# Patient Record
Sex: Male | Born: 1950 | Race: Black or African American | Hispanic: No | Smoking: Former smoker
Health system: Southern US, Community
[De-identification: ages and names within clinical notes are randomized; demographics above are authoritative.]

## PROBLEM LIST (undated history)

## (undated) DIAGNOSIS — G40909 Epilepsy, unspecified, not intractable, without status epilepticus: Secondary | ICD-10-CM

## (undated) DIAGNOSIS — Z9981 Dependence on supplemental oxygen: Secondary | ICD-10-CM

## (undated) DIAGNOSIS — F329 Major depressive disorder, single episode, unspecified: Secondary | ICD-10-CM

## (undated) DIAGNOSIS — C3490 Malignant neoplasm of unspecified part of unspecified bronchus or lung: Secondary | ICD-10-CM

## (undated) DIAGNOSIS — F32A Depression, unspecified: Secondary | ICD-10-CM

## (undated) DIAGNOSIS — J189 Pneumonia, unspecified organism: Secondary | ICD-10-CM

## (undated) DIAGNOSIS — Z9289 Personal history of other medical treatment: Secondary | ICD-10-CM

## (undated) DIAGNOSIS — F419 Anxiety disorder, unspecified: Secondary | ICD-10-CM

## (undated) DIAGNOSIS — R0902 Hypoxemia: Secondary | ICD-10-CM

## (undated) DIAGNOSIS — A159 Respiratory tuberculosis unspecified: Secondary | ICD-10-CM

## (undated) DIAGNOSIS — I2510 Atherosclerotic heart disease of native coronary artery without angina pectoris: Secondary | ICD-10-CM

## (undated) DIAGNOSIS — I10 Essential (primary) hypertension: Secondary | ICD-10-CM

## (undated) DIAGNOSIS — F1910 Other psychoactive substance abuse, uncomplicated: Secondary | ICD-10-CM

## (undated) DIAGNOSIS — R7611 Nonspecific reaction to tuberculin skin test without active tuberculosis: Secondary | ICD-10-CM

## (undated) DIAGNOSIS — K922 Gastrointestinal hemorrhage, unspecified: Secondary | ICD-10-CM

## (undated) DIAGNOSIS — I639 Cerebral infarction, unspecified: Secondary | ICD-10-CM

## (undated) DIAGNOSIS — K769 Liver disease, unspecified: Secondary | ICD-10-CM

## (undated) HISTORY — PX: LUNG REMOVAL, PARTIAL: SHX233

---

## 2007-10-30 DIAGNOSIS — I639 Cerebral infarction, unspecified: Secondary | ICD-10-CM

## 2007-10-30 DIAGNOSIS — Z9289 Personal history of other medical treatment: Secondary | ICD-10-CM

## 2007-10-30 HISTORY — DX: Cerebral infarction, unspecified: I63.9

## 2007-10-30 HISTORY — DX: Personal history of other medical treatment: Z92.89

## 2012-02-27 HISTORY — PX: HIP ARTHROPLASTY: SHX981

## 2012-07-15 HISTORY — PX: LUNG BIOPSY: SHX232

## 2013-02-17 ENCOUNTER — Telehealth: Payer: Self-pay | Admitting: *Deleted

## 2013-02-17 ENCOUNTER — Encounter: Payer: Self-pay | Admitting: *Deleted

## 2013-02-17 NOTE — Progress Notes (Signed)
Called [facility] to obtain CD of scans.  Faxed requested and verified message received.  Called daughter with appt for mtoc 02/26/13 at 1:30 labs and 2:00 Dr. Arbutus Ped

## 2013-02-17 NOTE — Telephone Encounter (Signed)
Daughter called back to confirm appt

## 2013-02-24 ENCOUNTER — Telehealth: Payer: Self-pay | Admitting: Internal Medicine

## 2013-02-24 NOTE — Telephone Encounter (Signed)
C/D 02/24/13 for appt. 02/26/13

## 2013-02-26 ENCOUNTER — Emergency Department (HOSPITAL_COMMUNITY): Payer: Medicaid Other

## 2013-02-26 ENCOUNTER — Inpatient Hospital Stay (HOSPITAL_COMMUNITY)
Admission: EM | Admit: 2013-02-26 | Discharge: 2013-03-04 | DRG: 193 | Disposition: A | Discharge status: Skilled Nursing Facility | Payer: Medicaid Other | Attending: Internal Medicine | Admitting: Internal Medicine

## 2013-02-26 ENCOUNTER — Ambulatory Visit: Payer: Self-pay | Admitting: Internal Medicine

## 2013-02-26 ENCOUNTER — Encounter (HOSPITAL_COMMUNITY): Payer: Self-pay | Admitting: *Deleted

## 2013-02-26 DIAGNOSIS — Z7902 Long term (current) use of antithrombotics/antiplatelets: Secondary | ICD-10-CM

## 2013-02-26 DIAGNOSIS — J189 Pneumonia, unspecified organism: Principal | ICD-10-CM | POA: Diagnosis present

## 2013-02-26 DIAGNOSIS — I10 Essential (primary) hypertension: Secondary | ICD-10-CM | POA: Diagnosis present

## 2013-02-26 DIAGNOSIS — R7401 Elevation of levels of liver transaminase levels: Secondary | ICD-10-CM | POA: Diagnosis present

## 2013-02-26 DIAGNOSIS — K7689 Other specified diseases of liver: Secondary | ICD-10-CM | POA: Diagnosis present

## 2013-02-26 DIAGNOSIS — J9600 Acute respiratory failure, unspecified whether with hypoxia or hypercapnia: Secondary | ICD-10-CM

## 2013-02-26 DIAGNOSIS — J9601 Acute respiratory failure with hypoxia: Secondary | ICD-10-CM | POA: Diagnosis present

## 2013-02-26 DIAGNOSIS — Z79899 Other long term (current) drug therapy: Secondary | ICD-10-CM

## 2013-02-26 DIAGNOSIS — K8020 Calculus of gallbladder without cholecystitis without obstruction: Secondary | ICD-10-CM | POA: Diagnosis present

## 2013-02-26 DIAGNOSIS — R9431 Abnormal electrocardiogram [ECG] [EKG]: Secondary | ICD-10-CM | POA: Diagnosis present

## 2013-02-26 DIAGNOSIS — D72829 Elevated white blood cell count, unspecified: Secondary | ICD-10-CM | POA: Diagnosis present

## 2013-02-26 DIAGNOSIS — N179 Acute kidney failure, unspecified: Secondary | ICD-10-CM | POA: Diagnosis present

## 2013-02-26 DIAGNOSIS — R0902 Hypoxemia: Secondary | ICD-10-CM | POA: Diagnosis present

## 2013-02-26 DIAGNOSIS — R7402 Elevation of levels of lactic acid dehydrogenase (LDH): Secondary | ICD-10-CM | POA: Diagnosis present

## 2013-02-26 DIAGNOSIS — Z87891 Personal history of nicotine dependence: Secondary | ICD-10-CM

## 2013-02-26 DIAGNOSIS — D638 Anemia in other chronic diseases classified elsewhere: Secondary | ICD-10-CM | POA: Diagnosis present

## 2013-02-26 HISTORY — DX: Essential (primary) hypertension: I10

## 2013-02-26 LAB — CBC WITH DIFFERENTIAL/PLATELET
Basophils Absolute: 0 10*3/uL (ref 0.0–0.1)
Eosinophils Relative: 2 % (ref 0–5)
HCT: 32.2 % — ABNORMAL LOW (ref 39.0–52.0)
Lymphocytes Relative: 23 % (ref 12–46)
MCHC: 31.1 g/dL (ref 30.0–36.0)
MCV: 81.5 fL (ref 78.0–100.0)
Monocytes Absolute: 0.4 10*3/uL (ref 0.1–1.0)
RDW: 19.1 % — ABNORMAL HIGH (ref 11.5–15.5)
WBC: 10.8 10*3/uL — ABNORMAL HIGH (ref 4.0–10.5)

## 2013-02-26 LAB — COMPREHENSIVE METABOLIC PANEL
AST: 45 U/L — ABNORMAL HIGH (ref 0–37)
CO2: 26 mEq/L (ref 19–32)
Calcium: 9.1 mg/dL (ref 8.4–10.5)
Creatinine, Ser: 1.45 mg/dL — ABNORMAL HIGH (ref 0.50–1.35)
GFR calc Af Amer: 58 mL/min — ABNORMAL LOW (ref >90)
GFR calc non Af Amer: 50 mL/min — ABNORMAL LOW (ref >90)

## 2013-02-26 LAB — PROTIME-INR
INR: 1.05 (ref 0.00–1.49)
Prothrombin Time: 13.6 seconds (ref 11.6–15.2)

## 2013-02-26 MED ORDER — ONDANSETRON HCL 4 MG/2ML IJ SOLN
4.0000 mg | Freq: Once | INTRAMUSCULAR | Status: AC
  Administered 2013-02-26: 4 mg via INTRAVENOUS
  Filled 2013-02-26: qty 2

## 2013-02-26 MED ORDER — ONDANSETRON HCL 4 MG PO TABS: 4.0000 mg | ORAL_TABLET | Freq: Four times a day (QID) | ORAL | Status: DC | PRN

## 2013-02-26 MED ORDER — IOHEXOL 300 MG/ML  SOLN
50.0000 mL | Freq: Once | INTRAMUSCULAR | Status: AC | PRN
  Administered 2013-02-26: 50 mL via ORAL

## 2013-02-26 MED ORDER — VERAPAMIL HCL 40 MG PO TABS
40.0000 mg | ORAL_TABLET | Freq: Every day | ORAL | Status: DC
  Administered 2013-02-27 – 2013-03-04 (×6): 40 mg via ORAL
  Filled 2013-02-26 (×6): qty 1

## 2013-02-26 MED ORDER — HYDROCODONE-ACETAMINOPHEN 5-325 MG PO TABS
1.0000 | ORAL_TABLET | ORAL | Status: DC | PRN
  Administered 2013-02-27: 2 via ORAL
  Filled 2013-02-26: qty 2

## 2013-02-26 MED ORDER — METHADONE HCL 10 MG PO TABS
70.0000 mg | ORAL_TABLET | Freq: Every day | ORAL | Status: DC
  Administered 2013-02-26 – 2013-02-28 (×3): 70 mg via ORAL
  Filled 2013-02-26 (×3): qty 7

## 2013-02-26 MED ORDER — DOCUSATE SODIUM 100 MG PO CAPS
100.0000 mg | ORAL_CAPSULE | Freq: Two times a day (BID) | ORAL | Status: DC
  Administered 2013-02-26 – 2013-03-04 (×12): 100 mg via ORAL
  Filled 2013-02-26 (×13): qty 1

## 2013-02-26 MED ORDER — SODIUM CHLORIDE 0.9 % IV SOLN: INTRAVENOUS | Status: DC

## 2013-02-26 MED ORDER — ONDANSETRON HCL 4 MG/2ML IJ SOLN: 4.0000 mg | Freq: Four times a day (QID) | INTRAMUSCULAR | Status: DC | PRN

## 2013-02-26 MED ORDER — ACETAMINOPHEN 650 MG RE SUPP: 650.0000 mg | Freq: Four times a day (QID) | RECTAL | Status: DC | PRN

## 2013-02-26 MED ORDER — MORPHINE SULFATE 2 MG/ML IJ SOLN
1.0000 mg | INTRAMUSCULAR | Status: DC | PRN
  Administered 2013-03-01 – 2013-03-03 (×4): 1 mg via INTRAVENOUS
  Filled 2013-02-26 (×4): qty 1

## 2013-02-26 MED ORDER — CLOPIDOGREL BISULFATE 75 MG PO TABS
75.0000 mg | ORAL_TABLET | Freq: Every day | ORAL | Status: DC
  Administered 2013-02-27 – 2013-03-04 (×6): 75 mg via ORAL
  Filled 2013-02-26 (×6): qty 1

## 2013-02-26 MED ORDER — METOPROLOL SUCCINATE ER 100 MG PO TB24
200.0000 mg | ORAL_TABLET | Freq: Every day | ORAL | Status: DC
  Administered 2013-02-27 – 2013-03-04 (×6): 200 mg via ORAL
  Filled 2013-02-26 (×6): qty 2

## 2013-02-26 MED ORDER — DEXTROSE 5 % IV SOLN
500.0000 mg | INTRAVENOUS | Status: DC
  Administered 2013-02-27: 500 mg via INTRAVENOUS
  Filled 2013-02-26 (×2): qty 500

## 2013-02-26 MED ORDER — ADULT MULTIVITAMIN W/MINERALS CH
1.0000 | ORAL_TABLET | Freq: Every day | ORAL | Status: DC
  Administered 2013-02-27 – 2013-03-04 (×6): 1 via ORAL
  Filled 2013-02-26 (×6): qty 1

## 2013-02-26 MED ORDER — ACETAMINOPHEN 325 MG PO TABS: 650.0000 mg | ORAL_TABLET | Freq: Four times a day (QID) | ORAL | Status: DC | PRN

## 2013-02-26 MED ORDER — DEXTROSE 5 % IV SOLN
1.0000 g | INTRAVENOUS | Status: DC
  Administered 2013-02-27 – 2013-02-28 (×2): 1 g via INTRAVENOUS
  Filled 2013-02-26 (×2): qty 10

## 2013-02-26 MED ORDER — ALBUTEROL SULFATE (5 MG/ML) 0.5% IN NEBU
2.5000 mg | INHALATION_SOLUTION | RESPIRATORY_TRACT | Status: DC | PRN
  Administered 2013-03-01 – 2013-03-03 (×3): 2.5 mg via RESPIRATORY_TRACT
  Filled 2013-02-26 (×3): qty 0.5

## 2013-02-26 MED ORDER — ASPIRIN EC 81 MG PO TBEC
81.0000 mg | DELAYED_RELEASE_TABLET | Freq: Every day | ORAL | Status: DC
  Administered 2013-02-26 – 2013-03-04 (×7): 81 mg via ORAL
  Filled 2013-02-26 (×7): qty 1

## 2013-02-26 MED ORDER — IOHEXOL 300 MG/ML  SOLN
100.0000 mL | Freq: Once | INTRAMUSCULAR | Status: AC | PRN
  Administered 2013-02-26: 100 mL via INTRAVENOUS

## 2013-02-26 MED ORDER — AZITHROMYCIN 250 MG PO TABS
500.0000 mg | ORAL_TABLET | Freq: Once | ORAL | Status: AC
  Administered 2013-02-26: 500 mg via ORAL
  Filled 2013-02-26: qty 2

## 2013-02-26 MED ORDER — DEXTROSE 5 % IV SOLN
1.0000 g | Freq: Once | INTRAVENOUS | Status: AC
  Administered 2013-02-26: 1 g via INTRAVENOUS
  Filled 2013-02-26: qty 10
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
## 2013-02-26 NOTE — ED Notes (Signed)
Pt placed on 2lpm O2 per RN

## 2013-02-26 NOTE — H&P (Signed)
[facility] History and Physical  Mark [name] ZOX:[id number] DOB: November 03, 1950 DOA: 02/26/2013  Referring physician: ER physician PCP: No primary provider on file.   Chief Complaint: Shortness of breath  HPI:  62 year old male with past medical history significant for lung cancer status post partial lobectomy on the left side without chemotherapy or radiation therapy, hypertension who presented [facility] ED with worsening shortness of breath for past few days prior to this admission. Patient reported chronic intermittent dry cough. No chest pain. No palpitations. No complaints of abdominal pain, nausea or vomiting. No diarrhea or constipation. No fever or chills. No lightheadedness or loss of consciousness. No weakness. In ED, evaluation included chest x-ray which was significant for left lower lobe consolidation. CT chest was significant for bilateral pneumonia with consolidation air bronchograms on the right. bilateral partially loculated pleural effusions but no clear evidence of metastatic disease. CT abdomen was significant for left hepatic lobe lesion which per radiology may be evaluated with dedicated MRI.  Assessment and plan:  Principal Problem:   Acute respiratory failure with hypoxia - Likely related to community-acquired pneumonia - Respiratory status stable, patient saturating 100% on 2 L nasal cannula - Pneumonia order set in place. Followup blood cultures, respiratory cultures, Legionella and strep pneumonia results - Continue azithromycin and ceftriaxone IV - Nebulizer treatments as needed  Active Problems:   CAP (community acquired pneumonia) - Management as above with azithromycin and ceftriaxone   Essential hypertension, benign - Continue home medication, metoprolol and verapamil   Anemia of chronic disease - Likely related to history of prior lung cancer - Hemoglobin 10 on this admission   Acute kidney failure - Perhaps prerenal etiology, dehydration - Continue IV  fluids and follow up renal function the morning   Leukocytosis, unspecified - Likely secondary to community-acquired pneumonia  Code Status: Full Family Communication: Pt at bedside Disposition Plan: Admit for further evaluation  Manson Passey, MD  [facility] Pager [phone]  If 7PM-7AM, please contact night-coverage [email or web address] Password [email or web address] 02/26/2013, 4:58 PM   Review of Systems:  Constitutional: Negative for fever, chills and malaise/fatigue. Negative for diaphoresis.  HENT: Negative for hearing loss, ear pain, nosebleeds, congestion, sore throat, neck pain, tinnitus and ear discharge.   Eyes: Negative for blurred vision, double vision, photophobia, pain, discharge and redness.  Respiratory: Per history of present illness.   Cardiovascular: Negative for chest pain, palpitations, orthopnea, claudication and leg swelling.  Gastrointestinal: Negative for nausea, vomiting and abdominal pain. Negative for heartburn, constipation, blood in stool and melena.  Genitourinary: Negative for dysuria, urgency, frequency, hematuria and flank pain.  Musculoskeletal: Negative for myalgias, back pain, joint pain and falls.  Skin: Negative for itching and rash.  Neurological: Negative for dizziness and weakness. Negative for tingling, tremors, sensory change, speech change, focal weakness, loss of consciousness and headaches.  Endo/Heme/Allergies: Negative for environmental allergies and polydipsia. Does not bruise/bleed easily.  Psychiatric/Behavioral: Negative for suicidal ideas. The patient is not nervous/anxious.      Past Medical History  Diagnosis Date  . Cancer     lung  . Essential hypertension, benign 02/26/2013   Past Surgical History  Procedure Laterality Date  . Left hip surgery    . Lung surgery-lobectomy     Social History:  reports that he quit smoking about a year ago. His smoking use included Cigarettes. He smoked 0.00 packs per day. He has never used smokeless tobacco. He reports  that he does not drink alcohol or use illicit drugs.  No Known Allergies  Family History: Family medical history significant for HTN, HLD   Prior to Admission medications   Medication Sig Start Date End Date Taking? Authorizing Provider  clopidogrel (PLAVIX) 75 MG tablet Take 75 mg by mouth daily.   Yes Historical Provider, MD  methadone (DOLOPHINE) 10 MG tablet Take 70 mg by mouth daily.   Yes Historical Provider, MD  metoprolol succinate (TOPROL-XL) 100 MG 24 hr tablet Take 200 mg by mouth daily with breakfast. Take with or immediately following a meal.   Yes Historical Provider, MD  Multiple Vitamin (MULTIVITAMIN WITH MINERALS) TABS Take 1 tablet by mouth daily.   Yes Historical Provider, MD  verapamil (CALAN) 80 MG tablet Take 40 mg by mouth daily.   Yes Historical Provider, MD   Physical Exam: Filed Vitals:   02/26/13 1430 02/26/13 1433 02/26/13 1528 02/26/13 1555  BP:  162/94 166/94 162/90  Pulse: 62   69  Temp:   97.7 F (36.5 C) 97.8 F (36.6 C)  TempSrc:   Oral Oral  Resp: 8 13 12 16   Height:    5' 11.5" (1.816 m)  SpO2: 100% 100% 100% 100%    Physical Exam  Constitutional: Appears well-developed and well-nourished. No distress.  HENT: Normocephalic. External right and left ear normal. Oropharynx is clear and moist.  Eyes: Conjunctivae and EOM are normal. PERRLA, no scleral icterus.  Neck: Normal ROM. Neck supple. No JVD. No tracheal deviation. No thyromegaly.  CVS: RRR, S1/S2 +, no murmurs, no gallops, no carotid bruit.  Pulmonary: Effort and breath sounds normal, no stridor, rhonchi, wheezes, rales.  Abdominal: Soft. BS +,  no distension, tenderness, rebound or guarding.  Musculoskeletal: Normal range of motion. No edema and no tenderness.  Lymphadenopathy: No lymphadenopathy noted, cervical, inguinal. Neuro: Alert. Normal reflexes, muscle tone coordination. No cranial nerve deficit. Skin: Skin is warm and dry. No rash noted. Not diaphoretic. No erythema. No pallor.   Psychiatric: Normal mood and affect. Behavior, judgment, thought content normal.   Labs on Admission:  Basic Metabolic Panel:  Recent Labs Lab 02/26/13 0950  NA 139  K 4.0  CL 102  CO2 26  GLUCOSE 178*  BUN 31*  CREATININE 1.45*  CALCIUM 9.1   Liver Function Tests:  Recent Labs Lab 02/26/13 0950  AST 45*  ALT 15  ALKPHOS 96  BILITOT 0.4  PROT 8.5*  ALBUMIN 3.0*   No results found for this basename: LIPASE, AMYLASE,  in the last 168 hours No results found for this basename: AMMONIA,  in the last 168 hours CBC:  Recent Labs Lab 02/26/13 0950  WBC 10.8*  NEUTROABS 7.7  HGB 10.0*  HCT 32.2*  MCV 81.5  PLT 400   Cardiac Enzymes: No results found for this basename: CKTOTAL, CKMB, CKMBINDEX, TROPONINI,  in the last 168 hours BNP: No components found with this basename: POCBNP,  CBG:  Recent Labs Lab 02/26/13 0926  GLUCAP 154*    Radiological Exams on Admission: Dg Chest 2 View 02/26/2013  *  IMPRESSION: Airspace disease bilaterally with consolidation in the left lower lobe, particularly involving the superior segment.  An underlying mass cannot be excluded. Given the clinical history and absence of prior studies to compare, correlation with chest CT, ideally with intravenous contrast, at this time may well be advisable to further assess.   Ct Chest W Contrast 02/26/2013  *  IMPRESSION:  1.  Bilateral pneumonia with consolidation air bronchograms on the right. 2.  Bilateral partially loculated pleural effusions. 3.  No  clear evidence of metastatic disease.  CT ABDOMEN AND PELVIS  Findings:  Low density lesion in the anterior aspect the right hepatic lobe measuring 27 x 11 mm (image 62).  No biliary duct dilatation.  A small gallstone in the gallbladder.  The pancreas, spleen, adrenal glands, and right adrenal gland are normal.  There is small nodule within the left adrenal gland measuring 7 mm. Kidneys are normal.  The stomach, small bowel, appendix, cecum are  normal.  The colon and rectosigmoid colon are normal.  Abdominal aorta normal caliber.  No retroperitoneal or periportal lymphadenopathy.  Review of  bone windows demonstrates no aggressive osseous lesions.  IMPRESSION:  1. Indeterminate lesion in the anterior left hepatic lobe. Recommend MRI without and with contrast preferably in the outpatient setting when the patient can hold his breath.  2.  Contrast within the esophagus suggests gastroesophageal reflux disease.  3.  Small nodule within the left adrenal gland.  Consider evaluation with recommend MRI as above.   Original Report Authenticated By: Genevive Bi, M.D.    Ct Abdomen Pelvis W Contrast 02/26/2013  * IMPRESSION:  1. Indeterminate lesion in the anterior left hepatic lobe. Recommend MRI without and with contrast preferably in the outpatient setting when the patient can hold his breath.  2.  Contrast within the esophagus suggests gastroesophageal reflux disease.  3.  Small nodule within the left adrenal gland.  Consider evaluation with recommend MRI as above.   Original Report Authenticated By: Genevive Bi, M.D.     EKG: Normal sinus rhythm, no ST/T wave changes

## 2013-02-26 NOTE — ED Notes (Signed)
Report called to [facility]

## 2013-02-26 NOTE — ED Notes (Signed)
Pt and family reports pt has hx of lung cancer. Since feb wearing oxygen at night. 2 days ago started having SOB and cough. 83% on room air. Placed on 2 L [place].

## 2013-02-26 NOTE — ED Provider Notes (Signed)
History     CSN: [id number]  Arrival date & time 02/26/13  0919   First MD Initiated Contact with Patient 02/26/13 [phone]      Chief Complaint  Patient presents with  . lung cancer pt, shob      HPI Patient reports worsening shortness of breath over the past 2 days.  He has a history of lung cancer.  He was treated in [place] in 2013 with a partial lobectomy on his left side.  No chemotherapy or radiation.  His breathing worsened over the past several days.  He denies orthopnea.  He has dyspnea with exertion.  Since February start wearing oxygen at night and was found to have oxygen saturations of 83% on room air today.  The patient does not have a primary care physician in the area.  His symptoms are mild to moderate in severity.  His symptoms seem to be worsening.  Chills without documented fever.  Nothing improves his symptoms   Past Medical History  Diagnosis Date  . Cancer     lung    History reviewed. No pertinent past surgical history.  History reviewed. No pertinent family history.  History  Substance Use Topics  . Smoking status: Not on file  . Smokeless tobacco: Not on file  . Alcohol Use: Not on file      Review of Systems  All other systems reviewed and are negative.    Allergies  Review of patient's allergies indicates no known allergies.  Home Medications  No current outpatient prescriptions on file.  BP 177/88  Temp(Src) 99.3 F (37.4 C) (Rectal)  Resp 15  SpO2 100%  Physical Exam  Nursing note and vitals reviewed. Constitutional: He is oriented to person, place, and time. He appears well-developed and well-nourished.  HENT:  Head: Normocephalic and atraumatic.  Eyes: EOM are normal.  Neck: Normal range of motion.  Cardiovascular: Normal rate, regular rhythm, normal heart sounds and intact distal pulses.   Pulmonary/Chest: Effort normal and breath sounds normal. No respiratory distress.  Rhonchi bilaterally  Abdominal: Soft. He exhibits no  distension. There is no tenderness.  Genitourinary: Rectum normal.  Musculoskeletal: Normal range of motion.  Neurological: He is alert and oriented to person, place, and time.  Skin: Skin is warm and dry.  Psychiatric: He has a normal mood and affect. Judgment normal.    ED Course  Procedures (including critical care time)   Date: 02/26/2013  Rate: 68  Rhythm: normal sinus rhythm  QRS Axis: normal  Intervals: normal  ST/T Wave abnormalities: Nonspecific ST and T wave changes  Conduction Disutrbances: none  Narrative Interpretation:   Old EKG Reviewed: No prior EKG available    Labs Reviewed  GLUCOSE, CAPILLARY - Abnormal; Notable for the following:    Glucose-Capillary 154 (*)    All other components within normal limits  CBC WITH DIFFERENTIAL - Abnormal; Notable for the following:    WBC 10.8 (*)    RBC 3.95 (*)    Hemoglobin 10.0 (*)    HCT 32.2 (*)    MCH 25.3 (*)    RDW 19.1 (*)    All other components within normal limits  COMPREHENSIVE METABOLIC PANEL - Abnormal; Notable for the following:    Glucose, Bld 178 (*)    BUN 31 (*)    Creatinine, Ser 1.45 (*)    Total Protein 8.5 (*)    Albumin 3.0 (*)    AST 45 (*)    GFR calc non Af  Amer 50 (*)    GFR calc Af Amer 58 (*)    All other components within normal limits  PROTIME-INR   Dg Chest 2 View  02/26/2013  *RADIOLOGY REPORT*  Clinical Data: History of lung carcinoma with chest pain and shortness of breath  CHEST - 2 VIEW  Comparison: None.  Findings:  There is airspace consolidation throughout much of the left lower lobe as well as more patchy infiltrate throughout the right mid and lower lung zones.  There is loculated left effusion.  Heart is mildly enlarged with normal pulmonary vascularity.  There is no convincing adenopathy.  No bone lesions are appreciable.  IMPRESSION: Airspace disease bilaterally with consolidation in the left lower lobe, particularly involving the superior segment.  An underlying mass  cannot be excluded. Given the clinical history and absence of prior studies to compare, correlation with chest CT, ideally with intravenous contrast, at this time may well be advisable to further assess.   Original Report Authenticated By: Bretta Bang, M.D.    Ct Chest W Contrast  02/26/2013  *RADIOLOGY REPORT*  Clinical Data:  Lung cancer, short of breath, chest pain  CT CHEST, ABDOMEN AND PELVIS WITH CONTRAST  Technique:  Multidetector CT imaging of the chest, abdomen and pelvis was performed following the standard protocol during bolus administration of intravenous contrast.  Contrast: 50mL OMNIPAQUE IOHEXOL 300 MG/ML  SOLN, OMNIPAQUE IOHEXOL 300 MG/ML  SOLN  Comparison:  Chest radiograph 02/26/2013  CT CHEST  Findings:  No axillary or supraclavicular lymphadenopathy.  No mediastinal or hilar lymphadenopathy  8 and 9 mm right lower paratracheal lymph nodes are noted.  There is oral contrast within the esophagus.  Review of the lung parenchyma demonstrates bilateral pleural effusions which are partially loculated on the left.  There is air space consolidation with air bronchograms in the right lower lobe. More diffuse air space disease within the left lower lobe and lingula.  No focal mass lesion identified.  No central obstructing lesion evident within the left or right bronchial tree.  IMPRESSION:  1.  Bilateral pneumonia with consolidation air bronchograms on the right. 2.  Bilateral partially loculated pleural effusions. 3.  No clear evidence of metastatic disease.  CT ABDOMEN AND PELVIS  Findings:  Low density lesion in the anterior aspect the right hepatic lobe measuring 27 x 11 mm (image 62).  No biliary duct dilatation.  A small gallstone in the gallbladder.  The pancreas, spleen, adrenal glands, and right adrenal gland are normal.  There is small nodule within the left adrenal gland measuring 7 mm. Kidneys are normal.  The stomach, small bowel, appendix, cecum are normal.  The colon and  rectosigmoid colon are normal.  Abdominal aorta normal caliber.  No retroperitoneal or periportal lymphadenopathy.  Review of  bone windows demonstrates no aggressive osseous lesions.  IMPRESSION:  1. Indeterminate lesion in the anterior left hepatic lobe. Recommend MRI without and with contrast preferably in the outpatient setting when the patient can hold his breath.  2.  Contrast within the esophagus suggests gastroesophageal reflux disease.  3.  Small nodule within the left adrenal gland.  Consider evaluation with recommend MRI as above.   Original Report Authenticated By: Genevive Bi, M.D.    Ct Abdomen Pelvis W Contrast  02/26/2013  *RADIOLOGY REPORT*  Clinical Data:  Lung cancer, short of breath, chest pain  CT CHEST, ABDOMEN AND PELVIS WITH CONTRAST  Technique:  Multidetector CT imaging of the chest, abdomen and pelvis was performed following  the standard protocol during bolus administration of intravenous contrast.  Contrast: 50mL OMNIPAQUE IOHEXOL 300 MG/ML  SOLN, OMNIPAQUE IOHEXOL 300 MG/ML  SOLN  Comparison:  Chest radiograph 02/26/2013  CT CHEST  Findings:  No axillary or supraclavicular lymphadenopathy.  No mediastinal or hilar lymphadenopathy  8 and 9 mm right lower paratracheal lymph nodes are noted.  There is oral contrast within the esophagus.  Review of the lung parenchyma demonstrates bilateral pleural effusions which are partially loculated on the left.  There is air space consolidation with air bronchograms in the right lower lobe. More diffuse air space disease within the left lower lobe and lingula.  No focal mass lesion identified.  No central obstructing lesion evident within the left or right bronchial tree.  IMPRESSION:  1.  Bilateral pneumonia with consolidation air bronchograms on the right. 2.  Bilateral partially loculated pleural effusions. 3.  No clear evidence of metastatic disease.  CT ABDOMEN AND PELVIS  Findings:  Low density lesion in the anterior aspect the right  hepatic lobe measuring 27 x 11 mm (image 62).  No biliary duct dilatation.  A small gallstone in the gallbladder.  The pancreas, spleen, adrenal glands, and right adrenal gland are normal.  There is small nodule within the left adrenal gland measuring 7 mm. Kidneys are normal.  The stomach, small bowel, appendix, cecum are normal.  The colon and rectosigmoid colon are normal.  Abdominal aorta normal caliber.  No retroperitoneal or periportal lymphadenopathy.  Review of  bone windows demonstrates no aggressive osseous lesions.  IMPRESSION:  1. Indeterminate lesion in the anterior left hepatic lobe. Recommend MRI without and with contrast preferably in the outpatient setting when the patient can hold his breath.  2.  Contrast within the esophagus suggests gastroesophageal reflux disease.  3.  Small nodule within the left adrenal gland.  Consider evaluation with recommend MRI as above.   Original Report Authenticated By: Genevive Bi, M.D.    I personally reviewed the imaging tests through PACS system I reviewed available ER/hospitalization records through the EMR   1. CAP (community acquired pneumonia)       MDM  Patient with evidence of community acquired pneumonia.  CT chest abdomen and pelvis completed given history of cancer  2:07 PM CT chest abdomen and pelvis with evidence of bilateral pneumonia.  No obvious masses noted.  Admit to hospital.        Lyanne Co, MD 02/26/13 [phone]

## 2013-02-26 NOTE — Progress Notes (Addendum)
[facility] ED CM Spoke with daughter after obtaining permission from patient in regards to obtaining  PCP. Daughter states, she was in the process of finding a PCP but he got sick, he was also scheduled for an Oncology appt today that he was not able to go to due to this ED visit. She was provided with a list of self pay PCP in [facility] She confirmed he has applied for Medicaid, and results are pending. She also confirmed that he has a walker, and was only using oxygen prn at night. Pt's daughter states, she is in the process of getting him DME services with [employer] or [employer]) and is waiting to hear back from them.

## 2013-02-26 NOTE — ED Notes (Signed)
Report to [employer]

## 2013-02-27 DIAGNOSIS — I10 Essential (primary) hypertension: Secondary | ICD-10-CM

## 2013-02-27 LAB — LEGIONELLA ANTIGEN, URINE: Legionella Antigen, Urine: NEGATIVE

## 2013-02-27 LAB — CBC
MCH: 24.3 pg — ABNORMAL LOW (ref 26.0–34.0)
MCHC: 29.5 g/dL — ABNORMAL LOW (ref 30.0–36.0)
Platelets: 348 10*3/uL (ref 150–400)
RBC: 3.75 MIL/uL — ABNORMAL LOW (ref 4.22–5.81)

## 2013-02-27 LAB — COMPREHENSIVE METABOLIC PANEL
AST: 65 U/L — ABNORMAL HIGH (ref 0–37)
CO2: 27 mEq/L (ref 19–32)
Calcium: 8.5 mg/dL (ref 8.4–10.5)
Chloride: 105 mEq/L (ref 96–112)
Creatinine, Ser: 1.34 mg/dL (ref 0.50–1.35)
GFR calc Af Amer: 64 mL/min — ABNORMAL LOW (ref >90)
GFR calc non Af Amer: 55 mL/min — ABNORMAL LOW (ref >90)
Glucose, Bld: 92 mg/dL (ref 70–99)
Total Bilirubin: 0.3 mg/dL (ref 0.3–1.2)

## 2013-02-27 LAB — APTT: aPTT: 33 seconds (ref 24–37)

## 2013-02-27 LAB — PROTIME-INR: Prothrombin Time: 14 seconds (ref 11.6–15.2)

## 2013-02-27 LAB — GLUCOSE, CAPILLARY: Glucose-Capillary: 117 mg/dL — ABNORMAL HIGH (ref 70–99)

## 2013-02-27 MED ORDER — POLYETHYLENE GLYCOL 3350 17 G PO PACK
17.0000 g | PACK | Freq: Every day | ORAL | Status: DC | PRN
  Filled 2013-02-27: qty 1

## 2013-02-27 MED ORDER — FLEET ENEMA 7-19 GM/118ML RE ENEM: 1.0000 | ENEMA | Freq: Once | RECTAL | Status: AC | PRN

## 2013-02-27 MED ORDER — SODIUM CHLORIDE 0.9 % IV SOLN
INTRAVENOUS | Status: AC
  Administered 2013-02-27: 15:00:00 via INTRAVENOUS

## 2013-02-27 NOTE — Progress Notes (Signed)
[facility] PROGRESS NOTE  Priscilla Finklea AVW:[id number] DOB: 09-09-1951 DOA: 02/26/2013 PCP: No primary provider on file.  Assessment/Plan: Principal Problem:  Acute respiratory failure with hypoxia  - Likely related to community-acquired pneumonia  - Respiratory status stable, patient saturating 100% on 2 L nasal cannula  - Pneumonia order set in place. Followup blood cultures, respiratory cultures, Legionella and strep pneumonia results  - Continue azithromycin and ceftriaxone IV  - Nebulizer treatments as needed  Active Problems:  CAP (community acquired pneumonia)  - Management as above with azithromycin and ceftriaxone  Essential hypertension, benign  - Continue home medication, metoprolol and verapamil  Anemia of chronic disease  - Likely related to history of prior lung cancer  - Hemoglobin 10 on this admission  Acute kidney failure  - Perhaps prerenal etiology, dehydration  - Continue IV fluids and follow up renal function the morning  Leukocytosis, unspecified  - Likely secondary to community-acquired pneumonia  - IMPROVING.    HPI/Subjective:  comfortable  Objective: Filed Vitals:   02/26/13 1528 02/26/13 1555 02/26/13 2144 02/27/13 0605  BP: 166/94 162/90 153/88 159/93  Pulse:  69 62 69  Temp: 97.7 F (36.5 C) 97.8 F (36.6 C) 98.2 F (36.8 C) 98.4 F (36.9 C)  TempSrc: Oral Oral Oral Oral  Resp: 12 16 16 16   Height:  5' 11.5" (1.816 m)    SpO2: 100% 100% 97% 99%    Intake/Output Summary (Last 24 hours) at 02/27/13 0820 Last data filed at 02/27/13 0713  Gross per 24 hour  Intake 1467.5 ml  Output    150 ml  Net 1317.5 ml   There were no vitals filed for this visit.  Exam: Alert afebrile comfortable.  CVS: RRR, S1/S2 +, no murmurs, no gallops, no carotid bruit.  Pulmonary: Effort and breath sounds normal, no stridor, rhonchi, wheezes, rales.  Abdominal: Soft. BS +, no distension, tenderness, rebound or guarding.  Musculoskeletal: Normal range  of motion. No edema and no tenderness.  Lymphadenopathy: No lymphadenopathy noted, cervical, inguinal. Neuro: Alert. Normal reflexes, muscle tone coordination. No cranial nerve deficit.   Data Reviewed: Basic Metabolic Panel:  Recent Labs Lab 02/26/13 0950 02/27/13 0500  NA 139 141  K 4.0 3.9  CL 102 105  CO2 26 27  GLUCOSE 178* 92  BUN 31* 31*  CREATININE 1.45* 1.34  CALCIUM 9.1 8.5   Liver Function Tests:  Recent Labs Lab 02/26/13 0950 02/27/13 0500  AST 45* 65*  ALT 15 24  ALKPHOS 96 117  BILITOT 0.4 0.3  PROT 8.5* 7.2  ALBUMIN 3.0* 2.5*   No results found for this basename: LIPASE, AMYLASE,  in the last 168 hours No results found for this basename: AMMONIA,  in the last 168 hours CBC:  Recent Labs Lab 02/26/13 0950 02/27/13 0500  WBC 10.8* 10.0  NEUTROABS 7.7  --   HGB 10.0* 9.1*  HCT 32.2* 30.8*  MCV 81.5 82.1  PLT 400 348   Cardiac Enzymes: No results found for this basename: CKTOTAL, CKMB, CKMBINDEX, TROPONINI,  in the last 168 hours BNP (last 3 results) No results found for this basename: PROBNP,  in the last 8760 hours CBG:  Recent Labs Lab 02/26/13 0926  GLUCAP 154*    No results found for this or any previous visit (from the past 240 hour(s)).   Studies: Dg Chest 2 View  02/26/2013  *RADIOLOGY REPORT*  Clinical Data: History of lung carcinoma with chest pain and shortness of breath  CHEST - 2  VIEW  Comparison: None.  Findings:  There is airspace consolidation throughout much of the left lower lobe as well as more patchy infiltrate throughout the right mid and lower lung zones.  There is loculated left effusion.  Heart is mildly enlarged with normal pulmonary vascularity.  There is no convincing adenopathy.  No bone lesions are appreciable.  IMPRESSION: Airspace disease bilaterally with consolidation in the left lower lobe, particularly involving the superior segment.  An underlying mass cannot be excluded. Given the clinical history and  absence of prior studies to compare, correlation with chest CT, ideally with intravenous contrast, at this time may well be advisable to further assess.   Original Report Authenticated By: Bretta Bang, M.D.    Ct Chest W Contrast  02/26/2013  *RADIOLOGY REPORT*  Clinical Data:  Lung cancer, short of breath, chest pain  CT CHEST, ABDOMEN AND PELVIS WITH CONTRAST  Technique:  Multidetector CT imaging of the chest, abdomen and pelvis was performed following the standard protocol during bolus administration of intravenous contrast.  Contrast: 50mL OMNIPAQUE IOHEXOL 300 MG/ML  SOLN, OMNIPAQUE IOHEXOL 300 MG/ML  SOLN  Comparison:  Chest radiograph 02/26/2013  CT CHEST  Findings:  No axillary or supraclavicular lymphadenopathy.  No mediastinal or hilar lymphadenopathy  8 and 9 mm right lower paratracheal lymph nodes are noted.  There is oral contrast within the esophagus.  Review of the lung parenchyma demonstrates bilateral pleural effusions which are partially loculated on the left.  There is air space consolidation with air bronchograms in the right lower lobe. More diffuse air space disease within the left lower lobe and lingula.  No focal mass lesion identified.  No central obstructing lesion evident within the left or right bronchial tree.  IMPRESSION:  1.  Bilateral pneumonia with consolidation air bronchograms on the right. 2.  Bilateral partially loculated pleural effusions. 3.  No clear evidence of metastatic disease.  CT ABDOMEN AND PELVIS  Findings:  Low density lesion in the anterior aspect the right hepatic lobe measuring 27 x 11 mm (image 62).  No biliary duct dilatation.  A small gallstone in the gallbladder.  The pancreas, spleen, adrenal glands, and right adrenal gland are normal.  There is small nodule within the left adrenal gland measuring 7 mm. Kidneys are normal.  The stomach, small bowel, appendix, cecum are normal.  The colon and rectosigmoid colon are normal.  Abdominal aorta normal  caliber.  No retroperitoneal or periportal lymphadenopathy.  Review of  bone windows demonstrates no aggressive osseous lesions.  IMPRESSION:  1. Indeterminate lesion in the anterior left hepatic lobe. Recommend MRI without and with contrast preferably in the outpatient setting when the patient can hold his breath.  2.  Contrast within the esophagus suggests gastroesophageal reflux disease.  3.  Small nodule within the left adrenal gland.  Consider evaluation with recommend MRI as above.   Original Report Authenticated By: Genevive Bi, M.D.    Ct Abdomen Pelvis W Contrast  02/26/2013  *RADIOLOGY REPORT*  Clinical Data:  Lung cancer, short of breath, chest pain  CT CHEST, ABDOMEN AND PELVIS WITH CONTRAST  Technique:  Multidetector CT imaging of the chest, abdomen and pelvis was performed following the standard protocol during bolus administration of intravenous contrast.  Contrast: 50mL OMNIPAQUE IOHEXOL 300 MG/ML  SOLN, OMNIPAQUE IOHEXOL 300 MG/ML  SOLN  Comparison:  Chest radiograph 02/26/2013  CT CHEST  Findings:  No axillary or supraclavicular lymphadenopathy.  No mediastinal or hilar lymphadenopathy  8 and 9  mm right lower paratracheal lymph nodes are noted.  There is oral contrast within the esophagus.  Review of the lung parenchyma demonstrates bilateral pleural effusions which are partially loculated on the left.  There is air space consolidation with air bronchograms in the right lower lobe. More diffuse air space disease within the left lower lobe and lingula.  No focal mass lesion identified.  No central obstructing lesion evident within the left or right bronchial tree.  IMPRESSION:  1.  Bilateral pneumonia with consolidation air bronchograms on the right. 2.  Bilateral partially loculated pleural effusions. 3.  No clear evidence of metastatic disease.  CT ABDOMEN AND PELVIS  Findings:  Low density lesion in the anterior aspect the right hepatic lobe measuring 27 x 11 mm (image 62).  No  biliary duct dilatation.  A small gallstone in the gallbladder.  The pancreas, spleen, adrenal glands, and right adrenal gland are normal.  There is small nodule within the left adrenal gland measuring 7 mm. Kidneys are normal.  The stomach, small bowel, appendix, cecum are normal.  The colon and rectosigmoid colon are normal.  Abdominal aorta normal caliber.  No retroperitoneal or periportal lymphadenopathy.  Review of  bone windows demonstrates no aggressive osseous lesions.  IMPRESSION:  1. Indeterminate lesion in the anterior left hepatic lobe. Recommend MRI without and with contrast preferably in the outpatient setting when the patient can hold his breath.  2.  Contrast within the esophagus suggests gastroesophageal reflux disease.  3.  Small nodule within the left adrenal gland.  Consider evaluation with recommend MRI as above.   Original Report Authenticated By: Genevive Bi, M.D.     Scheduled Meds: . aspirin EC  81 mg Oral Daily  . azithromycin  500 mg Intravenous Q24H  . cefTRIAXone (ROCEPHIN)  IV  1 g Intravenous Q24H  . clopidogrel  75 mg Oral Daily  . docusate sodium  100 mg Oral BID  . methadone  70 mg Oral Daily  . metoprolol succinate  200 mg Oral Daily  . multivitamin with minerals  1 tablet Oral Daily  . verapamil  40 mg Oral Daily   Continuous Infusions:   Principal Problem:   Acute respiratory failure with hypoxia Active Problems:   CAP (community acquired pneumonia)   Essential hypertension, benign   Anemia of chronic disease   Acute kidney failure   Leukocytosis, unspecified        Fayola Meckes  [facility] Pager [phone]. If 7PM-7AM, please contact night-coverage at [email or web address], password [facility] 02/27/2013, 8:20 AM  LOS: 1 day

## 2013-02-27 NOTE — Progress Notes (Signed)
Nutrition Brief Note  Patient identified on the Malnutrition Screening Tool (MST) Report  Height: 5' 11.5'' Weight: None yet recorded   Current diet order is heart healthy, patient is consuming approximately 100% of meals at this time. Labs and medications reviewed. Pt with lung CA s/p partial lobectomy. Met with pt who reports eating well PTA, 4-5 meals/day and stable weight.   No nutrition interventions warranted at this time. If nutrition issues arise, please consult RD.   Levon Hedger MS, RD, LDN [phone] Pager [phone] After Hours Pager

## 2013-02-27 NOTE — Evaluation (Signed)
Physical Therapy Evaluation Patient Details Name: Mark [name] MRN: [id number] DOB: 1951-01-05 Today's Date: 02/27/2013 Time: 9147-8295 PT Time Calculation (min): 20 min  PT Assessment / Plan / Recommendation Clinical Impression  Pt presents with acute respiratory failure w/ hypoxia in the setting of CAP.  Note history of lung cancer with partial lobectomy with intermittent O2 use at home.  Tolerated some ambulation in hallway, however was painful in abdomen and became nauseated upon returning to room.  Also required +2 assist for safety at time of eval.  Pt will benefit from skilled PT in acute venue to address deficits.  PT recommends SNF vs HHPT pending progress in hospital.      PT Assessment  Patient needs continued PT services    Follow Up Recommendations  Home health PT;SNF;Supervision/Assistance - 24 hour    Does the patient have the potential to tolerate intense rehabilitation      Barriers to Discharge Decreased caregiver support      Equipment Recommendations  None recommended by PT    Recommendations for Other Services OT consult   Frequency Min 3X/week    Precautions / Restrictions Precautions Precautions: Fall Precaution Comments: monitor O2 Restrictions Weight Bearing Restrictions: No   Pertinent Vitals/Pain Pt with 7/10 pain in abdomen and nauseated at end of session.  RN made aware.       Mobility  Bed Mobility Bed Mobility: Not assessed Details for Bed Mobility Assistance: Pt on 3in1 when PT arrived.  Transfers Transfers: Sit to Stand;Stand to Sit Sit to Stand: 4: Min assist;With upper extremity assist;With armrests;From chair/3-in-1 Stand to Sit: 4: Min assist;With upper extremity assist;With armrests;To chair/3-in-1 Details for Transfer Assistance: Assist to rise, steady and ensure controlled descent with cues for hand placement and safety.   Ambulation/Gait Ambulation/Gait Assistance: 1: +2 Total assist Ambulation/Gait: Patient Percentage:  60% Ambulation Distance (Feet): 20 Feet Assistive device: Rolling walker Ambulation/Gait Assistance Details: Cues for sequencing/technique with RW, maintaining upright posture and continued breathing.  Again, noted leg length discrepancy with Trendelenburg gait pattern. Upon returning to room, pt became nauseated and "dry heaved" more so than vomit as pt had not eaten anything.  RN made aware.  Unable to obtain SaO2 with ambulation as sensor would not read.  Gait Pattern: Step-to pattern;Decreased stride length;Trendelenburg;Antalgic;Lateral hip instability;Lateral trunk lean to left;Trunk flexed Gait velocity: decreased Stairs: No Wheelchair Mobility Wheelchair Mobility: No    Exercises     PT Diagnosis: Difficulty walking;Generalized weakness;Acute pain  PT Problem List: Decreased strength;Decreased activity tolerance;Decreased balance;Decreased mobility;Decreased coordination;Decreased knowledge of use of DME;Decreased knowledge of precautions;Pain PT Treatment Interventions: DME instruction;Gait training;Functional mobility training;Therapeutic activities;Therapeutic exercise;Balance training;Patient/family education   PT Goals Acute Rehab PT Goals PT Goal Formulation: With patient Time For Goal Achievement: 03/06/13 Potential to Achieve Goals: Good Pt will go Supine/Side to Sit: with supervision PT Goal: Supine/Side to Sit - Progress: Goal set today Pt will go Sit to Supine/Side: with supervision PT Goal: Sit to Supine/Side - Progress: Goal set today Pt will go Sit to Stand: with supervision PT Goal: Sit to Stand - Progress: Goal set today Pt will go Stand to Sit: with supervision PT Goal: Stand to Sit - Progress: Goal set today Pt will Ambulate: 51 - 150 feet;with supervision;with least restrictive assistive device PT Goal: Ambulate - Progress: Goal set today Pt will Perform Home Exercise Program: with supervision, verbal cues required/provided PT Goal: Perform Home Exercise  Program - Progress: Goal set today  Visit Information  Last PT Received On: 02/27/13  Assistance Needed: +2    Subjective Data  Subjective: My stomach hurts Patient Stated Goal: n/a   Prior Functioning  Home Living Lives With: Spouse;Son Available Help at Discharge: Family;Available PRN/intermittently (He states they are there "most of time") Type of Home: House Home Layout: One level Bathroom Shower/Tub: Engineer, manufacturing systems: Standard Home Adaptive Equipment: Straight cane;Walker - rolling Prior Function Level of Independence: Needs assistance Vocation: Retired Musician:  (difficult to understand pt at times)    Cognition  Cognition Arousal/Alertness: Awake/alert Behavior During Therapy: WFL for tasks assessed/performed Overall Cognitive Status: Within Functional Limits for tasks assessed    Extremity/Trunk Assessment Right Lower Extremity Assessment RLE ROM/Strength/Tone: [facility] for tasks assessed Left Lower Extremity Assessment LLE ROM/Strength/Tone: Deficits LLE ROM/Strength/Tone Deficits: Pt states that he had gotten his hip replaced last year in [place] and 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  He states that he "made" some wedges for his shoes.   Trunk Assessment Trunk Assessment: Kyphotic   Balance    End of Session PT - End of Session Equipment Utilized During Treatment: Gait belt;Oxygen Activity Tolerance: Other (comment) (nausea) Patient left: in chair;with call bell/phone within reach Nurse Communication: Mobility status (nausea)  GP     Vista Deck 02/27/2013, 5:14 PM

## 2013-02-28 MED ORDER — LEVOFLOXACIN 750 MG PO TABS
750.0000 mg | ORAL_TABLET | Freq: Every day | ORAL | Status: DC
  Administered 2013-02-28: 750 mg via ORAL
  Filled 2013-02-28 (×2): qty 1

## 2013-02-28 MED ORDER — PROMETHAZINE HCL 25 MG/ML IJ SOLN
12.5000 mg | Freq: Four times a day (QID) | INTRAMUSCULAR | Status: DC | PRN
  Administered 2013-02-28 – 2013-03-04 (×2): 12.5 mg via INTRAVENOUS
  Filled 2013-02-28 (×2): qty 1
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
## 2013-02-28 NOTE — Progress Notes (Signed)
[facility] PROGRESS NOTE  Mark [name] ZOX:[id number] DOB: [birth year] DOA: 02/26/2013 PCP: No primary provider on file.  Assessment/Plan: Principal Problem:  Acute respiratory failure with hypoxia  - Likely related to community-acquired pneumonia  - Respiratory status stable, patient saturating 100% on 2 L nasal cannula  - changed to po levaquin.  - Pneumonia order set in place. Followup blood cultures,-  - negative legionella and streptococcal antigen.  - Nebulizer treatments as needed  Active Problems:  CAP (community acquired pneumonia)  - Management as above with azithromycin and ceftriaxone  Essential hypertension, benign  - Continue home medication, metoprolol and verapamil  Anemia of chronic disease  - Likely related to history of prior lung cancer  - Hemoglobin 10 on this admission  Acute kidney failure  - Perhaps prerenal etiology, dehydration  - Continue IV fluids and follow up renal function the morning  Leukocytosis, unspecified  - Likely secondary to community-acquired pneumonia  - IMPROVING.    HPI/Subjective:  comfortable  Objective: Filed Vitals:   02/27/13 1333 02/27/13 2200 02/28/13 0600 02/28/13 1400  BP: 111/72 152/96 159/89 136/86  Pulse: 56 63 65 55  Temp: 97.4 F (36.3 C) 97.9 F (36.6 C) 98 F (36.7 C) 97.6 F (36.4 C)  TempSrc: Oral Oral Oral Axillary  Resp:  18 18 18   Height:      Weight:   68.493 kg (151 lb)   SpO2: 100% 94% 97% 97%    Intake/Output Summary (Last 24 hours) at 02/28/13 1621 Last data filed at 02/28/13 1330  Gross per 24 hour  Intake    785 ml  Output    300 ml  Net    485 ml   Filed Weights   02/28/13 0600  Weight: 68.493 kg (151 lb)    Exam: Alert afebrile comfortable.  CVS: RRR, S1/S2 +, no murmurs, no gallops, no carotid bruit.  Pulmonary: Effort and breath sounds normal, no stridor, rhonchi, wheezes, rales.  Abdominal: Soft. BS +, no distension, tenderness, rebound or guarding.  Musculoskeletal:  Normal range of motion. No edema and no tenderness.  Lymphadenopathy: No lymphadenopathy noted, cervical, inguinal. Neuro: Alert. Normal reflexes, muscle tone coordination. No cranial nerve deficit.   Data Reviewed: Basic Metabolic Panel:  Recent Labs Lab 02/26/13 0950 02/27/13 0500  NA 139 141  K 4.0 3.9  CL 102 105  CO2 26 27  GLUCOSE 178* 92  BUN 31* 31*  CREATININE 1.45* 1.34  CALCIUM 9.1 8.5   Liver Function Tests:  Recent Labs Lab 02/26/13 0950 02/27/13 0500  AST 45* 65*  ALT 15 24  ALKPHOS 96 117  BILITOT 0.4 0.3  PROT 8.5* 7.2  ALBUMIN 3.0* 2.5*   No results found for this basename: LIPASE, AMYLASE,  in the last 168 hours No results found for this basename: AMMONIA,  in the last 168 hours CBC:  Recent Labs Lab 02/26/13 0950 02/27/13 0500  WBC 10.8* 10.0  NEUTROABS 7.7  --   HGB 10.0* 9.1*  HCT 32.2* 30.8*  MCV 81.5 82.1  PLT 400 348   Cardiac Enzymes: No results found for this basename: CKTOTAL, CKMB, CKMBINDEX, TROPONINI,  in the last 168 hours BNP (last 3 results) No results found for this basename: PROBNP,  in the last 8760 hours CBG:  Recent Labs Lab 02/26/13 0926 02/27/13 0800 02/28/13 0814  GLUCAP 154* 117* 70    Recent Results (from the past 240 hour(s))  CULTURE, BLOOD (ROUTINE X 2)     Status: None  Collection Time    02/26/13  5:30 PM      Result Value Range Status   Specimen Description BLOOD RIGHT HAND   Final   Special Requests BOTTLES DRAWN AEROBIC ONLY 2CC   Final   Culture  Setup Time 02/27/2013 01:00   Final   Culture     Final   Value:        BLOOD CULTURE RECEIVED NO GROWTH TO DATE CULTURE WILL BE HELD FOR 5 DAYS BEFORE ISSUING A FINAL NEGATIVE REPORT   Report Status PENDING   Incomplete  CULTURE, BLOOD (ROUTINE X 2)     Status: None   Collection Time    02/26/13 10:24 PM      Result Value Range Status   Specimen Description BLOOD RIGHT ARM   Final   Special Requests BOTTLES DRAWN AEROBIC AND ANAEROBIC 2CC    Final   Culture  Setup Time 02/27/2013 01:01   Final   Culture     Final   Value:        BLOOD CULTURE RECEIVED NO GROWTH TO DATE CULTURE WILL BE HELD FOR 5 DAYS BEFORE ISSUING A FINAL NEGATIVE REPORT   Report Status PENDING   Incomplete     Studies: No results found.  Scheduled Meds: . aspirin EC  81 mg Oral Daily  . clopidogrel  75 mg Oral Daily  . docusate sodium  100 mg Oral BID  . levofloxacin  750 mg Oral Daily  . methadone  70 mg Oral Daily  . metoprolol succinate  200 mg Oral Daily  . multivitamin with minerals  1 tablet Oral Daily  . verapamil  40 mg Oral Daily   Continuous Infusions:   Principal Problem:   Acute respiratory failure with hypoxia Active Problems:   CAP (community acquired pneumonia)   Essential hypertension, benign   Anemia of chronic disease   Acute kidney failure   Leukocytosis, unspecified        Mark [name]  [facility] Pager [phone]. If 7PM-7AM, please contact night-coverage at [email or web address], password [facility] 02/28/2013, 4:21 PM  LOS: 2 days

## 2013-03-01 ENCOUNTER — Inpatient Hospital Stay (HOSPITAL_COMMUNITY): Payer: Medicaid Other

## 2013-03-01 LAB — BASIC METABOLIC PANEL
CO2: 26 mEq/L (ref 19–32)
Chloride: 105 mEq/L (ref 96–112)
GFR calc Af Amer: 70 mL/min — ABNORMAL LOW (ref >90)
Potassium: 4.7 mEq/L (ref 3.5–5.1)

## 2013-03-01 LAB — MAGNESIUM: Magnesium: 2.3 mg/dL (ref 1.5–2.5)

## 2013-03-01 LAB — GLUCOSE, CAPILLARY: Glucose-Capillary: 79 mg/dL (ref 70–99)

## 2013-03-01 LAB — HEPATIC FUNCTION PANEL
Albumin: 2.3 g/dL — ABNORMAL LOW (ref 3.5–5.2)
Total Protein: 6.9 g/dL (ref 6.0–8.3)

## 2013-03-01 MED ORDER — TRAMADOL HCL 50 MG PO TABS
100.0000 mg | ORAL_TABLET | Freq: Four times a day (QID) | ORAL | Status: DC | PRN
  Administered 2013-03-01 – 2013-03-03 (×2): 100 mg via ORAL
  Filled 2013-03-01 (×2): qty 2

## 2013-03-01 MED ORDER — ACETAMINOPHEN 325 MG PO TABS: 650.0000 mg | ORAL_TABLET | Freq: Three times a day (TID) | ORAL | Status: DC | PRN

## 2013-03-01 MED ORDER — HYDRALAZINE HCL 25 MG PO TABS
25.0000 mg | ORAL_TABLET | Freq: Three times a day (TID) | ORAL | Status: DC
  Administered 2013-03-01 – 2013-03-04 (×8): 25 mg via ORAL
  Filled 2013-03-01 (×12): qty 1

## 2013-03-01 MED ORDER — LORAZEPAM 2 MG/ML IJ SOLN
1.0000 mg | Freq: Once | INTRAMUSCULAR | Status: AC
  Administered 2013-03-01: 1 mg via INTRAVENOUS
  Filled 2013-03-01: qty 1

## 2013-03-01 MED ORDER — DEXTROSE 5 % IV SOLN
1.0000 g | INTRAVENOUS | Status: DC
  Administered 2013-03-01: 1 g via INTRAVENOUS
  Filled 2013-03-01 (×2): qty 10

## 2013-03-01 MED ORDER — DOXYCYCLINE HYCLATE 100 MG IV SOLR
100.0000 mg | Freq: Two times a day (BID) | INTRAVENOUS | Status: DC
  Administered 2013-03-01 (×2): 100 mg via INTRAVENOUS
  Filled 2013-03-01 (×3): qty 100

## 2013-03-01 MED ORDER — HYDRALAZINE HCL 20 MG/ML IJ SOLN
10.0000 mg | Freq: Four times a day (QID) | INTRAMUSCULAR | Status: DC | PRN
  Administered 2013-03-01 – 2013-03-02 (×2): 10 mg via INTRAVENOUS
  Filled 2013-03-01 (×2): qty 1

## 2013-03-01 MED ORDER — MAGNESIUM SULFATE 40 MG/ML IJ SOLN
2.0000 g | Freq: Once | INTRAMUSCULAR | Status: DC
  Filled 2013-03-01: qty 50

## 2013-03-01 NOTE — Progress Notes (Addendum)
Clinical Social Work Department CLINICAL SOCIAL WORK PLACEMENT NOTE 03/01/2013  Patient:  Mark [name], Mark [name]  Account Number:  [id number] Admit date:  02/26/2013  Clinical Social Worker:  Doroteo Glassman  Date/time:  03/01/2013 11:31 AM  Clinical Social Work is seeking post-discharge placement for this patient at the following level of care:   SKILLED NURSING   (*CSW will update this form in Epic as items are completed)   02/28/2013  Patient/family provided with [facility] Department of Clinical Social Work's list of facilities offering this level of care within the geographic area requested by the patient (or if unable, by the patient's family).  02/28/2013  Patient/family informed of their freedom to choose among providers that offer the needed level of care, that participate in Medicare, Medicaid or managed care program needed by the patient, have an available bed and are willing to accept the patient.  02/28/2013  Patient/family informed of [facility]' ownership interest in [facility] as well as of the fact that they are under no obligation to receive care at this facility.  PASARR submitted to EDS on 02/28/2013 PASARR number received from [employer] on 02/28/2013  FL2 transmitted to all facilities in geographic area requested by pt/family on  03/01/2013 FL2 transmitted to all facilities within larger geographic area on 03/04/13  Patient informed that his/her managed care company has contracts with or will negotiate with  certain facilities, including the following:     Patient/family informed of bed offers received:  03/04/13 Patient chooses bed at [facility] Physician recommends and patient chooses bed at    Patient to be transferred [facility]  on  03/04/13 Patient to be transferred to facility by [facility]  The following physician request were entered in Epic:   Additional Comments:  Providence Crosby, Theresia Majors Clinical Social Work [phone]

## 2013-03-01 NOTE — Progress Notes (Signed)
[facility] PROGRESS NOTE  Mark [name] ZOX:[id number] DOB: [birth year] DOA: 02/26/2013 PCP: No primary provider on file.  Assessment/Plan: Principal Problem:  Acute respiratory failure with hypoxia  - Likely related to community-acquired pneumonia  - Respiratory status stable, patient saturating 100% on 2 L nasal cannula  - Patient's antibiotics were changed to rocephin and doxycycline as developed QT prolongation.  - Pneumonia order set in place. Followup blood cultures,-  - negative legionella and streptococcal antigen.  - Nebulizer treatments as needed  Active Problems:  CAP (community acquired pneumonia)  - Management as above with doxycycline and ceftriaxone  Essential hypertension, benign  - Continue home medication, metoprolol and verapamil  Anemia of chronic disease  - Likely related to history of prior lung cancer  - Hemoglobin stable. Acute kidney failure  - Perhaps prerenal etiology, dehydration  - Continue IV fluids improving renal function.  Leukocytosis, unspecified  - Likely secondary to community-acquired pneumonia  - IMPROVING.  QT prolongation: his electrolytes are stable.  - possibly from medications. - methadone held and he was given  Tramadol and toradol will be ordered.   HPI/Subjective:  comfortable, agrees to snf for rehabilitation.   Objective: Filed Vitals:   03/01/13 0609 03/01/13 0634 03/01/13 0720 03/01/13 1336  BP: 171/99 167/92 168/97 143/84  Pulse:  61  56  Temp:    98.6 F (37 C)  TempSrc:    Oral  Resp:    18  Height:      Weight:      SpO2:  97%  98%    Intake/Output Summary (Last 24 hours) at 03/01/13 1715 Last data filed at 03/01/13 1537  Gross per 24 hour  Intake      0 ml  Output    450 ml  Net   -450 ml   Filed Weights   02/28/13 0600  Weight: 68.493 kg (151 lb)    Exam: Alert afebrile comfortable.  CVS: RRR, S1/S2 +, no murmurs, no gallops, no carotid bruit.  Pulmonary: Effort and breath sounds normal, no  stridor, rhonchi, wheezes, rales.  Abdominal: Soft. BS +, no distension, tenderness, rebound or guarding.  Musculoskeletal: Normal range of motion. No edema and no tenderness.  Lymphadenopathy: No lymphadenopathy noted, cervical, inguinal. Neuro: Alert. Normal reflexes, muscle tone coordination. No cranial nerve deficit.   Data Reviewed: Basic Metabolic Panel:  Recent Labs Lab 02/26/13 0950 02/27/13 0500 03/01/13 1024  NA 139 141 139  K 4.0 3.9 4.7  CL 102 105 105  CO2 26 27 26   GLUCOSE 178* 92 111*  BUN 31* 31* 42*  CREATININE 1.45* 1.34 1.24  CALCIUM 9.1 8.5 8.5  MG  --   --  2.3   Liver Function Tests:  Recent Labs Lab 02/26/13 0950 02/27/13 0500 03/01/13 1024  AST 45* 65* 1855*  ALT 15 24 457*  ALKPHOS 96 117 219*  BILITOT 0.4 0.3 0.5  PROT 8.5* 7.2 6.9  ALBUMIN 3.0* 2.5* 2.3*    Recent Labs Lab 03/01/13 1024  LIPASE 20   No results found for this basename: AMMONIA,  in the last 168 hours CBC:  Recent Labs Lab 02/26/13 0950 02/27/13 0500  WBC 10.8* 10.0  NEUTROABS 7.7  --   HGB 10.0* 9.1*  HCT 32.2* 30.8*  MCV 81.5 82.1  PLT 400 348   Cardiac Enzymes: No results found for this basename: CKTOTAL, CKMB, CKMBINDEX, TROPONINI,  in the last 168 hours BNP (last 3 results) No results found for this basename: PROBNP,  in  the last 8760 hours CBG:  Recent Labs Lab 02/26/13 0926 02/27/13 0800 02/28/13 0814 03/01/13 0727 03/01/13 0754  GLUCAP 154* 117* 70 65* 79    Recent Results (from the past 240 hour(s))  CULTURE, BLOOD (ROUTINE X 2)     Status: None   Collection Time    02/26/13  5:30 PM      Result Value Range Status   Specimen Description BLOOD RIGHT HAND   Final   Special Requests BOTTLES DRAWN AEROBIC ONLY 2CC   Final   Culture  Setup Time 02/27/2013 01:00   Final   Culture     Final   Value:        BLOOD CULTURE RECEIVED NO GROWTH TO DATE CULTURE WILL BE HELD FOR 5 DAYS BEFORE ISSUING A FINAL NEGATIVE REPORT   Report Status PENDING    Incomplete  CULTURE, BLOOD (ROUTINE X 2)     Status: None   Collection Time    02/26/13 10:24 PM      Result Value Range Status   Specimen Description BLOOD RIGHT ARM   Final   Special Requests BOTTLES DRAWN AEROBIC AND ANAEROBIC 2CC   Final   Culture  Setup Time 02/27/2013 01:01   Final   Culture     Final   Value:        BLOOD CULTURE RECEIVED NO GROWTH TO DATE CULTURE WILL BE HELD FOR 5 DAYS BEFORE ISSUING A FINAL NEGATIVE REPORT   Report Status PENDING   Incomplete     Studies: No results found.  Scheduled Meds: . aspirin EC  81 mg Oral Daily  . cefTRIAXone (ROCEPHIN)  IV  1 g Intravenous Q24H  . clopidogrel  75 mg Oral Daily  . docusate sodium  100 mg Oral BID  . doxycycline (VIBRAMYCIN) IV  100 mg Intravenous Q12H  . hydrALAZINE  25 mg Oral Q8H  . metoprolol succinate  200 mg Oral Daily  . multivitamin with minerals  1 tablet Oral Daily  . verapamil  40 mg Oral Daily   Continuous Infusions:   Principal Problem:   Acute respiratory failure with hypoxia Active Problems:   CAP (community acquired pneumonia)   Essential hypertension, benign   Anemia of chronic disease   Acute kidney failure   Leukocytosis, unspecified        Mark [name]  [facility] Pager [phone]. If 7PM-7AM, please contact night-coverage at [email or web address], password [facility] 03/01/2013, 5:15 PM  LOS: 3 days

## 2013-03-01 NOTE — Progress Notes (Signed)
Met with Pt and daughter.  Pt and daughter stated that Pt has recently applied for Medicaid but that they are unsure of the status.  CSW to notify Artist and ask for assistance with Pt's M'caid.  CSW thanked Pt and daughter for their time.  LM for Artist.  Providence Crosby, LCSWA Clinical Social Work [phone]

## 2013-03-01 NOTE — Progress Notes (Signed)
Pt's IV occluded. Attempted to restart IV by this rn was unsuccessful. IV team notified. No success noted from IV team. Pt has no access. MD notified. Vwilliams,rn.

## 2013-03-02 ENCOUNTER — Inpatient Hospital Stay (HOSPITAL_COMMUNITY): Payer: Medicaid Other

## 2013-03-02 LAB — GLUCOSE, CAPILLARY: Glucose-Capillary: 85 mg/dL (ref 70–99)

## 2013-03-02 MED ORDER — VANCOMYCIN HCL IN DEXTROSE 750-5 MG/150ML-% IV SOLN
750.0000 mg | Freq: Two times a day (BID) | INTRAVENOUS | Status: DC
  Administered 2013-03-02 – 2013-03-04 (×5): 750 mg via INTRAVENOUS
  Filled 2013-03-02 (×6): qty 150

## 2013-03-02 MED ORDER — PIPERACILLIN-TAZOBACTAM 3.375 G IVPB
3.3750 g | Freq: Three times a day (TID) | INTRAVENOUS | Status: DC
  Administered 2013-03-02 – 2013-03-04 (×6): 3.375 g via INTRAVENOUS
  Filled 2013-03-02 (×8): qty 50

## 2013-03-02 MED ORDER — PIPERACILLIN-TAZOBACTAM 3.375 G IVPB 30 MIN
3.3750 g | Freq: Once | INTRAVENOUS | Status: AC
  Administered 2013-03-02: 3.375 g via INTRAVENOUS
  Filled 2013-03-02: qty 50

## 2013-03-02 NOTE — Progress Notes (Signed)
ANTIBIOTIC CONSULT NOTE - INITIAL  Pharmacy Consult for vancomycin and Zosyn Indication: pneumonia  No Known Allergies  Patient Measurements: Height: 5' 11.5" (181.6 cm) Weight: 152 lb 6.4 oz (69.128 kg) IBW/kg (Calculated) : 76.45   Vital Signs: Temp: 98.8 F (37.1 C) (05/05 0600) Temp src: Oral (05/05 0600) BP: 165/88 mmHg (05/05 0600) Pulse Rate: 72 (05/05 0600) Intake/Output from previous day: 05/04 0701 - 05/05 0700 In: -  Out: 250 [Urine:250] Intake/Output from this shift:    Labs:  Recent Labs  03/01/13 1024  CREATININE 1.24   Estimated Creatinine Clearance: 60.4 ml/min (by C-G formula based on Cr of 1.24). No results found for this basename: VANCOTROUGH, VANCOPEAK, VANCORANDOM, GENTTROUGH, GENTPEAK, GENTRANDOM, TOBRATROUGH, TOBRAPEAK, TOBRARND, AMIKACINPEAK, AMIKACINTROU, AMIKACIN,  in the last 72 hours   Microbiology: Recent Results (from the past 720 hour(s))  CULTURE, BLOOD (ROUTINE X 2)     Status: None   Collection Time    02/26/13  5:30 PM      Result Value Range Status   Specimen Description BLOOD RIGHT HAND   Final   Special Requests BOTTLES DRAWN AEROBIC ONLY 2CC   Final   Culture  Setup Time 02/27/2013 01:00   Final   Culture     Final   Value:        BLOOD CULTURE RECEIVED NO GROWTH TO DATE CULTURE WILL BE HELD FOR 5 DAYS BEFORE ISSUING A FINAL NEGATIVE REPORT   Report Status PENDING   Incomplete  CULTURE, BLOOD (ROUTINE X 2)     Status: None   Collection Time    02/26/13 10:24 PM      Result Value Range Status   Specimen Description BLOOD RIGHT ARM   Final   Special Requests BOTTLES DRAWN AEROBIC AND ANAEROBIC 2CC   Final   Culture  Setup Time 02/27/2013 01:01   Final   Culture     Final   Value:        BLOOD CULTURE RECEIVED NO GROWTH TO DATE CULTURE WILL BE HELD FOR 5 DAYS BEFORE ISSUING A FINAL NEGATIVE REPORT   Report Status PENDING   Incomplete    Medical History: Past Medical History  Diagnosis Date  . Cancer     lung  .  Essential hypertension, benign 02/26/2013    Medications:  Scheduled:  . aspirin EC  81 mg Oral Daily  . clopidogrel  75 mg Oral Daily  . docusate sodium  100 mg Oral BID  . hydrALAZINE  25 mg Oral Q8H  . [COMPLETED] LORazepam  1 mg Intravenous Once  . metoprolol succinate  200 mg Oral Daily  . multivitamin with minerals  1 tablet Oral Daily  . verapamil  40 mg Oral Daily  . [DISCONTINUED] cefTRIAXone (ROCEPHIN)  IV  1 g Intravenous Q24H  . [DISCONTINUED] doxycycline (VIBRAMYCIN) IV  100 mg Intravenous Q12H  . [DISCONTINUED] levofloxacin  750 mg Oral Daily  . [DISCONTINUED] magnesium sulfate 1 - 4 g bolus IVPB  2 g Intravenous Once  . [DISCONTINUED] methadone  70 mg Oral Daily   Infusions:   PRN: acetaminophen, albuterol, hydrALAZINE, morphine injection, polyethylene glycol, promethazine, traMADol, [DISCONTINUED] acetaminophen, [DISCONTINUED] acetaminophen  Assessment: 62 y/o M on ceftriaxone + doxycycline for bilateral CAP, CXR shows slight progression of disease.  Changing antibiotic regimen to Zosyn + vancomycin today.  Target range:   Vancomycin trough 15-20  Zosyn at dosage appropriate for renal function  Plan:  1. Zosyn 3.375 grams IV over 30 mins x 1,  then . . . 2. Zosyn 3.375 grams IV q8h (extended-infusion). 3. Vancomycin 750mg  IV q12h 4. Follow serum creatinine 5. If vancomycin continues, check trough at steady-state.   Elie Goody, PharmD, BCPS Pager: [phone] 03/02/2013  9:39 AM

## 2013-03-02 NOTE — Progress Notes (Signed)
Patient blood pressure was 184/111 calling MD but giving appresoline 25mg  at 0941, will continue to monitor patient and bp

## 2013-03-02 NOTE — Progress Notes (Signed)
[facility] PROGRESS NOTE  Mark [name] ZOX:[id number] DOB: 08/17/1951 DOA: 02/26/2013 PCP: No primary provider on file.  Assessment/Plan: Principal Problem:   Acute respiratory failure with hypoxia secondary to bilateral pneumonia. Despite 3 days of rocephin and zithromax/ levaquin, his pneumonia has not improved and his repeat CXR showed slight progression. Will change to broad spectrum coverage with vancomycin and zosyn.  - Respiratory status stable, patient saturating 100% on 2 L nasal cannula  - Pneumonia order set in place. Followup blood cultures,-  - negative legionella and streptococcal antigen.  - Nebulizer treatments as needed  - PT evaluation.  Active Problems:   Essential hypertension, benign  - Continue home medication, metoprolol and verapamil  Anemia of chronic disease  - Likely related to history of prior lung cancer  - Hemoglobin stable. Acute kidney failure  - Perhaps prerenal etiology, dehydration  - Continue IV fluids improving renal function.  Leukocytosis, unspecified  - Likely secondary to community-acquired pneumonia  - IMPROVING.  QT prolongation: his electrolytes are stable.  - possibly from medications. - methadone held and he was given  Tramadol and toradol will be ordered.   Nausea and vomiting:  With elevation of transaminases, MRI of the liver ordered with and without contrast to evaluate the abnormal lesion. Anti emetics on board.    DVT prophylaxis SCD'S   FULL CODE DISPO; SNF when stable.   HPI/Subjective:  comfortable, agrees to snf for rehabilitation.  no vomiting. Overnight he was agitated, probably from methadone withdrawal, was given a dose of ativan.  Objective: Filed Vitals:   03/01/13 0720 03/01/13 1336 03/01/13 2200 03/02/13 0600  BP: 168/97 143/84 149/82 165/88  Pulse:  56 63 72  Temp:  98.6 F (37 C) 98.7 F (37.1 C) 98.8 F (37.1 C)  TempSrc:  Oral Oral Oral  Resp:  18 18 18   Height:      Weight:    69.128 kg  (152 lb 6.4 oz)  SpO2:  98% 96% 95%    Intake/Output Summary (Last 24 hours) at 03/02/13 0908 Last data filed at 03/01/13 1537  Gross per 24 hour  Intake      0 ml  Output    250 ml  Net   -250 ml   Filed Weights   02/28/13 0600 03/02/13 0600  Weight: 68.493 kg (151 lb) 69.128 kg (152 lb 6.4 oz)    Exam: Alert afebrile comfortable.  CVS: RRR, S1/S2 +, no murmurs, no gallops, no carotid bruit.  Pulmonary: Effort and breath sounds normal, no stridor, rhonchi, wheezes, rales.  Abdominal: Soft. BS +, no distension, tenderness, rebound or guarding.  Musculoskeletal: Normal range of motion. No edema and no tenderness.  Lymphadenopathy: No lymphadenopathy noted, cervical, inguinal. Neuro: Alert. Normal reflexes, muscle tone coordination. No cranial nerve deficit.   Data Reviewed: Basic Metabolic Panel:  Recent Labs Lab 02/26/13 0950 02/27/13 0500 03/01/13 1024  NA 139 141 139  K 4.0 3.9 4.7  CL 102 105 105  CO2 26 27 26   GLUCOSE 178* 92 111*  BUN 31* 31* 42*  CREATININE 1.45* 1.34 1.24  CALCIUM 9.1 8.5 8.5  MG  --   --  2.3   Liver Function Tests:  Recent Labs Lab 02/26/13 0950 02/27/13 0500 03/01/13 1024  AST 45* 65* 1855*  ALT 15 24 457*  ALKPHOS 96 117 219*  BILITOT 0.4 0.3 0.5  PROT 8.5* 7.2 6.9  ALBUMIN 3.0* 2.5* 2.3*    Recent Labs Lab 03/01/13 1024  LIPASE 20  No results found for this basename: AMMONIA,  in the last 168 hours CBC:  Recent Labs Lab 02/26/13 0950 02/27/13 0500  WBC 10.8* 10.0  NEUTROABS 7.7  --   HGB 10.0* 9.1*  HCT 32.2* 30.8*  MCV 81.5 82.1  PLT 400 348   Cardiac Enzymes: No results found for this basename: CKTOTAL, CKMB, CKMBINDEX, TROPONINI,  in the last 168 hours BNP (last 3 results) No results found for this basename: PROBNP,  in the last 8760 hours CBG:  Recent Labs Lab 02/27/13 0800 02/28/13 0814 03/01/13 0727 03/01/13 0754 03/02/13 0753  GLUCAP 117* 70 65* 79 85    Recent Results (from the past 240  hour(s))  CULTURE, BLOOD (ROUTINE X 2)     Status: None   Collection Time    02/26/13  5:30 PM      Result Value Range Status   Specimen Description BLOOD RIGHT HAND   Final   Special Requests BOTTLES DRAWN AEROBIC ONLY 2CC   Final   Culture  Setup Time 02/27/2013 01:00   Final   Culture     Final   Value:        BLOOD CULTURE RECEIVED NO GROWTH TO DATE CULTURE WILL BE HELD FOR 5 DAYS BEFORE ISSUING A FINAL NEGATIVE REPORT   Report Status PENDING   Incomplete  CULTURE, BLOOD (ROUTINE X 2)     Status: None   Collection Time    02/26/13 10:24 PM      Result Value Range Status   Specimen Description BLOOD RIGHT ARM   Final   Special Requests BOTTLES DRAWN AEROBIC AND ANAEROBIC 2CC   Final   Culture  Setup Time 02/27/2013 01:01   Final   Culture     Final   Value:        BLOOD CULTURE RECEIVED NO GROWTH TO DATE CULTURE WILL BE HELD FOR 5 DAYS BEFORE ISSUING A FINAL NEGATIVE REPORT   Report Status PENDING   Incomplete     Studies: Dg Chest 2 View  03/01/2013  *RADIOLOGY REPORT*  Clinical Data: Shortness of breath.  CHEST - 2 VIEW  Comparison: 02/26/2013  Findings: Bilateral airspace opacities are noted with bilateral small to moderate effusions, left greater than right.  Findings are similar or possibly slightly progressed since prior study.  Heart is mildly enlarged.  No acute bony abnormality.  IMPRESSION: Slight progression of bilateral airspace disease and bilateral effusions.   Original Report Authenticated By: Charlett Nose, M.D.     Scheduled Meds: . aspirin EC  81 mg Oral Daily  . clopidogrel  75 mg Oral Daily  . docusate sodium  100 mg Oral BID  . hydrALAZINE  25 mg Oral Q8H  . metoprolol succinate  200 mg Oral Daily  . multivitamin with minerals  1 tablet Oral Daily  . verapamil  40 mg Oral Daily   Continuous Infusions:   Principal Problem:   Acute respiratory failure with hypoxia Active Problems:   CAP (community acquired pneumonia)   Essential hypertension, benign    Anemia of chronic disease   Acute kidney failure   Leukocytosis, unspecified        Thandiwe Siragusa  [facility] Pager [phone]. If 7PM-7AM, please contact night-coverage at [email or web address], password [facility] 03/02/2013, 9:08 AM  LOS: 4 days

## 2013-03-02 NOTE — Progress Notes (Signed)
Clinical Social Work  Patient was discussed during progression and it was reported that patient and family are interested in SNF placement. CSW attempted to meet with patient this morning to discuss offers but patient confused at this time and unable to fully participate in assessment.  CSW reviewed bed offers and no offers have been made at this time. CSW called [facility] to inquire if facility is currently accepting LOG. CSW left a message with CSW contact information included. CSW will continue to follow.  [place], [place] [phone]

## 2013-03-02 NOTE — Progress Notes (Signed)
PHARMACIST BRIEF NOTE:  METHADONE  Contacted [employer] to confirm methadone dosage.  Patient's usual methadone dosage was confirmed as 70 mg daily.  He is reportedly in a program that permits dispensing of a one week supply, and he last received a supply on 02/25/13.  Elie Goody, PharmD, BCPS Pager: [phone] 03/02/2013  10:23 AM

## 2013-03-02 NOTE — Progress Notes (Signed)
Physical Therapy Treatment Patient Details Name: Johnson Arizola MRN: [id number] DOB: 1950-12-05 Today's Date: 03/02/2013 Time: 8295-6213 PT Time Calculation (min): 25 min  PT Assessment / Plan / Recommendation Comments on Treatment Session  Pt very groggy and difficulty to arrouse.  Stated his mouth was dry as it was difficulty to understand his words.  Groggy/sleepy.  Assisted OOB to amb in hallway + 2 assist. Progressing slowly.     Follow Up Recommendations  SNF     Does the patient have the potential to tolerate intense rehabilitation     Barriers to Discharge        Equipment Recommendations  None recommended by PT    Recommendations for Other Services    Frequency Min 3X/week   Plan      Precautions / Restrictions Precautions Precautions: Fall Precaution Comments: monitor O2 Restrictions Weight Bearing Restrictions: No   Pertinent Vitals/Pain 2 lts O2 sats avg 94% during amb    Mobility  Bed Mobility Bed Mobility: Supine to Sit;Sitting - Scoot to Edge of Bed Supine to Sit: 4: Min guard Sitting - Scoot to [place] of Bed: 4: Min guard Details for Bed Mobility Assistance: increased time and repeat functional cueing to stay on task Transfers Transfers: Sit to Stand;Stand to Sit Sit to Stand: 4: Min assist;With upper extremity assist;With armrests;From chair/3-in-1 Stand to Sit: 4: Min assist;With upper extremity assist;With armrests;To chair/3-in-1 Details for Transfer Assistance: Assist to rise, steady and ensure controlled descent with cues for hand placement and safety.   Ambulation/Gait Ambulation/Gait Assistance: 1: +2 Total assist Ambulation/Gait: Patient Percentage: 70% Ambulation Distance (Feet): 38 Feet Assistive device: Rolling walker Ambulation/Gait Assistance Details: 50% VC's on upright posture and proper walker to self distance.  Amb on 2 lts O2 sats avg 94%. Limited activity tolerance as pt fatigues quickly. Gait Pattern: Step-to pattern;Decreased stride  length;Trendelenburg;Antalgic;Lateral hip instability;Lateral trunk lean to left;Trunk flexed Gait velocity: decreased    PT Goals                                                         progressing    Visit Information  Last PT Received On: 03/02/13 Assistance Needed: +2    Subjective Data  Subjective: I'm hungry   Cognition    slow   Balance   poor  End of Session PT - End of Session Equipment Utilized During Treatment: Gait belt;Oxygen Activity Tolerance: Patient limited by fatigue Patient left: in chair;with call bell/phone within reach   Felecia Shelling  PTA [facility]  Acute  Rehab Pager      [phone]

## 2013-03-03 LAB — GLUCOSE, CAPILLARY: Glucose-Capillary: 137 mg/dL — ABNORMAL HIGH (ref 70–99)

## 2013-03-03 MED ORDER — MORPHINE SULFATE 2 MG/ML IJ SOLN: 1.0000 mg | INTRAMUSCULAR | Status: DC | PRN

## 2013-03-03 MED ORDER — LORAZEPAM 1 MG PO TABS
1.0000 mg | ORAL_TABLET | Freq: Four times a day (QID) | ORAL | Status: DC | PRN
  Administered 2013-03-03 (×2): 1 mg via ORAL
  Filled 2013-03-03 (×2): qty 1

## 2013-03-03 MED ORDER — MORPHINE SULFATE 2 MG/ML IJ SOLN
2.0000 mg | INTRAMUSCULAR | Status: AC | PRN
  Administered 2013-03-03: 2 mg via INTRAVENOUS
  Filled 2013-03-03: qty 1

## 2013-03-03 NOTE — Progress Notes (Signed)
Clinical Social Work  CSW met with patient and family at bedside. CSW discussed DC plans and explained LOG process and the need to find a SNF that would accept patient with LOG. CSW currently talking with [facility] and [place] to determine if these facilities could accept patient.  CSW explained process to patient and family and agreed to keep them updated on plans. Dtr and patient report if patient starts feeling better and is able to ambulate then he would prefer to return home.  [place], [place] [phone]

## 2013-03-03 NOTE — Progress Notes (Signed)
[facility] PROGRESS NOTE  Mark [name] AOZ:[id number] DOB: 05-Sep-1951 DOA: 02/26/2013 PCP: No primary provider on file. Brief Summary: 62 year old male with past medical history significant for lung cancer status post partial lobectomy on the left side without chemotherapy or radiation therapy, hypertension who presented [facility] ED with worsening shortness of breath for past few days prior to this admission. He was found to have bilateral pneumonia not responding to zithromax and rocephin. He also developed QT prolongation from methadone( pain)  and azithromycin, after which his antibiotics were changed and his methadone was discontinued. PT eval was done recommended SNF placement.    Assessment/Plan: Principal Problem:   Acute respiratory failure with hypoxia secondary to bilateral pneumonia. Despite 3 days of rocephin and zithromax/ levaquin, his pneumonia has not improved and his repeat CXR showed slight progression. Will change to broad spectrum coverage with vancomycin and zosyn.  - Respiratory status stable, patient saturating 100% on 2 L nasal cannula  - Pneumonia order set in place. Followup blood cultures,-  - negative legionella and streptococcal antigen.  - Nebulizer treatments as needed  - PT evaluation recommending SNF placement.    Essential hypertension, benign  - Continue home medication, metoprolol and verapamil  Anemia of chronic disease  - Likely related to history of prior lung cancer  - Hemoglobin stable. Acute kidney failure  - Perhaps prerenal etiology, dehydration  - Continue IV fluids improving renal function.  Leukocytosis, unspecified  - resovled.  - Likely secondary to community-acquired pneumonia   QT prolongation:  Improved after stopping his methadone and switching the antibiotics. his electrolytes are stable.  - methadone held and he was given  Tramadol and toradol will be ordered.   Nausea and vomiting:  With elevation of transaminases. Unclear  etiology.as per the records from [place] [facility] his liver lesion was biopsied and was found to be benign cyst.  Check liver function tests in am.   Somnolence and Lethargy:  Probably from methadone, which was discontinued and he is more alert now. 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   DVT prophylaxis SCD'S   FULL CODE DISPO; SNF when stable.  Discussed with daughter    HPI/Subjective:  comfortable, agrees to snf for rehabilitation.  no vomiting. Overnight he was agitated, probably from methadone withdrawal, was given a dose of ativan. The daughter at bedside is appreciative that we stopped the methadone.  Objective: Filed Vitals:   03/02/13 1522 03/02/13 2222 03/03/13 0744 03/03/13 1410  BP: 153/86 184/111 178/93 174/83  Pulse: 59 70 69 68  Temp: 98.7 F (37.1 C) 99.9 F (37.7 C) 98.9 F (37.2 C) 98.8 F (37.1 C)  TempSrc: Oral Oral Oral Oral  Resp: 20 18  16   Height:      Weight:      SpO2: 99% 93% 97% 94%    Intake/Output Summary (Last 24 hours) at 03/03/13 1907 Last data filed at 03/03/13 0944  Gross per 24 hour  Intake      0 ml  Output    150 ml  Net   -150 ml   Filed Weights   02/28/13 0600 03/02/13 0600  Weight: 68.493 kg (151 lb) 69.128 kg (152 lb 6.4 oz)    Exam: Alert afebrile comfortable.  CVS: RRR, S1/S2 +, no murmurs, no gallops, no carotid bruit.  Pulmonary: Effort and breath sounds normal, no stridor, rhonchi, wheezes, rales.  Abdominal: Soft. BS +, no distension, tenderness, rebound or guarding.  Musculoskeletal: Normal range of motion. No edema and no  tenderness.  Neuro: Alert. Normal reflexes, muscle tone coordination. No cranial nerve deficit.   Data Reviewed: Basic Metabolic Panel:  Recent Labs Lab 02/26/13 0950 02/27/13 0500 03/01/13 1024  NA 139 141 139  K 4.0 3.9 4.7  CL 102 105 105  CO2 26 27 26   GLUCOSE 178* 92 111*  BUN 31* 31* 42*  CREATININE 1.45* 1.34 1.24  CALCIUM 9.1 8.5 8.5  MG  --   --  2.3   Liver Function  Tests:  Recent Labs Lab 02/26/13 0950 02/27/13 0500 03/01/13 1024  AST 45* 65* 1855*  ALT 15 24 457*  ALKPHOS 96 117 219*  BILITOT 0.4 0.3 0.5  PROT 8.5* 7.2 6.9  ALBUMIN 3.0* 2.5* 2.3*    Recent Labs Lab 03/01/13 1024  LIPASE 20   No results found for this basename: AMMONIA,  in the last 168 hours CBC:  Recent Labs Lab 02/26/13 0950 02/27/13 0500  WBC 10.8* 10.0  NEUTROABS 7.7  --   HGB 10.0* 9.1*  HCT 32.2* 30.8*  MCV 81.5 82.1  PLT 400 348   Cardiac Enzymes: No results found for this basename: CKTOTAL, CKMB, CKMBINDEX, TROPONINI,  in the last 168 hours BNP (last 3 results) No results found for this basename: PROBNP,  in the last 8760 hours CBG:  Recent Labs Lab 02/28/13 0814 03/01/13 0727 03/01/13 0754 03/02/13 0753 03/03/13 0746  GLUCAP 70 65* 79 85 80    Recent Results (from the past 240 hour(s))  CULTURE, BLOOD (ROUTINE X 2)     Status: None   Collection Time    02/26/13  5:30 PM      Result Value Range Status   Specimen Description BLOOD RIGHT HAND   Final   Special Requests BOTTLES DRAWN AEROBIC ONLY 2CC   Final   Culture  Setup Time 02/27/2013 01:00   Final   Culture     Final   Value:        BLOOD CULTURE RECEIVED NO GROWTH TO DATE CULTURE WILL BE HELD FOR 5 DAYS BEFORE ISSUING A FINAL NEGATIVE REPORT   Report Status PENDING   Incomplete  CULTURE, BLOOD (ROUTINE X 2)     Status: None   Collection Time    02/26/13 10:24 PM      Result Value Range Status   Specimen Description BLOOD RIGHT ARM   Final   Special Requests BOTTLES DRAWN AEROBIC AND ANAEROBIC 2CC   Final   Culture  Setup Time 02/27/2013 01:01   Final   Culture     Final   Value:        BLOOD CULTURE RECEIVED NO GROWTH TO DATE CULTURE WILL BE HELD FOR 5 DAYS BEFORE ISSUING A FINAL NEGATIVE REPORT   Report Status PENDING   Incomplete     Studies: Mr Brain 5 Contrast  03/02/2013  *RADIOLOGY REPORT*  Clinical Data: Lethargic.  Lung cancer.  Rule out metastatic disease.  MRI  HEAD WITHOUT CONTRAST  Technique:  Multiplanar, multiecho pulse sequences of the brain and surrounding structures were obtained according to standard protocol without intravenous contrast.  Comparison: None.  Findings: Incomplete study.  The patient was  moving and was  not able to complete the unenhanced portion of the study.  Intravenous contrast was not administered.  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  Chronic appearing ischemic changes in the pons and left cerebellum.  Hyperintensity left brachium pontes appears to  represent  chronic ischemia.  Negative for acute infarct.  Negative for mass lesion.  IMPRESSION: Moderate to advanced chronic ischemic changes.  No acute infarct.  Given the history, a follow-up repeat study with contrast is suggested when the patient is able to tolerate the study.   Original Report Authenticated By: Janeece Riggers, M.D.    US Abdomen Complete  03/02/2013  *RADIOLOGY REPORT*  Clinical Data:  Elevated liver function tests and history of lung carcinoma.  ABDOMEN ULTRASOUND  Technique:  Complete abdominal ultrasound examination was performed including evaluation of the liver, gallbladder, bile ducts, pancreas, kidneys, spleen, IVC, and abdominal aorta.  Comparison:  CT of the abdomen on 02/26/2013  Findings:  Gallbladder:  A few mobile calculi are identified in the gallbladder.  There is no evidence of gallbladder wall thickening or sonographic Murphy's sign.  Common Bile Duct:  Normal caliber of 5 mm.  Liver:  Subcapsular cyst of the anterior left lobe of the liver corresponds to the abnormality seen by CT and measures approximately 2.4 cm in greatest diameter by ultrasound.  This has the appearance of a benign cyst.  No evidence of solid lesions in the liver or biliary ductal dilatation.  IVC:  Patent throughout its visualized course in the abdomen.  Pancreas:  Although the pancreas is difficult to visualize in its  entirety, no focal pancreatic abnormality is identified.  Spleen:  The spleen shows normal echotexture and size.  Kidneys:  The right kidney measures 10.6 cm and the left kidney 10.7 cm.  Benign appearing left renal cyst present.  No evidence of hydronephrosis or solid renal lesions bilaterally.  Abdominal Aorta:  Normal caliber abdominal aorta.  IMPRESSION:  1.  Cholelithiasis without evidence by ultrasound of overt cholecystitis or biliary dilatation. 2.  Benign appearing subcapsular cyst of the anterior left lobe of the liver.   Original Report Authenticated By: Irish Lack, M.D.     Scheduled Meds: . aspirin EC  81 mg Oral Daily  . clopidogrel  75 mg Oral Daily  . docusate sodium  100 mg Oral BID  . hydrALAZINE  25 mg Oral Q8H  . metoprolol succinate  200 mg Oral Daily  . multivitamin with minerals  1 tablet Oral Daily  . piperacillin-tazobactam (ZOSYN)  IV  3.375 g Intravenous Q8H  . vancomycin  750 mg Intravenous Q12H  . verapamil  40 mg Oral Daily   Continuous Infusions:   Principal Problem:   Acute respiratory failure with hypoxia Active Problems:   CAP (community acquired pneumonia)   Essential hypertension, benign   Anemia of chronic disease   Acute kidney failure   Leukocytosis, unspecified        Linell Shawn  [facility] Pager [phone] If 7PM-7AM, please contact night-coverage at [email or web address], password [facility] 03/03/2013, 7:07 PM  LOS: 5 days

## 2013-03-03 NOTE — Progress Notes (Signed)
Patient is becoming more and more restless, each time he wakes with yells and "I want to go home", I explained to him the reason he is here and the benefits of continuing with treatment but he continues to exclaim "I want to go home", he is calling his daughter on the phone now, will continue to monitor 

## 2013-03-03 NOTE — Progress Notes (Deleted)
Patient is becoming more and more restless, each time he wakes with yells and "I want to go home", I explained to him the reason he is here and the benefits of continuing with treatment but he continues to exclaim "I want to go home", he is calling his daughter on the phone now, will continue to monitor

## 2013-03-04 LAB — CBC
MCHC: 31.3 g/dL (ref 30.0–36.0)
Platelets: 402 10*3/uL — ABNORMAL HIGH (ref 150–400)
RDW: 19.5 % — ABNORMAL HIGH (ref 11.5–15.5)
WBC: 11.2 10*3/uL — ABNORMAL HIGH (ref 4.0–10.5)

## 2013-03-04 LAB — COMPREHENSIVE METABOLIC PANEL
ALT: 252 U/L — ABNORMAL HIGH (ref 0–53)
AST: 340 U/L — ABNORMAL HIGH (ref 0–37)
Albumin: 2.2 g/dL — ABNORMAL LOW (ref 3.5–5.2)
Alkaline Phosphatase: 178 U/L — ABNORMAL HIGH (ref 39–117)
Chloride: 102 mEq/L (ref 96–112)
Potassium: 3.4 mEq/L — ABNORMAL LOW (ref 3.5–5.1)
Sodium: 136 mEq/L (ref 135–145)
Total Bilirubin: 0.7 mg/dL (ref 0.3–1.2)
Total Protein: 6.8 g/dL (ref 6.0–8.3)

## 2013-03-04 MED ORDER — ASPIRIN 81 MG PO TBEC: 81.0000 mg | DELAYED_RELEASE_TABLET | Freq: Every day | ORAL | Status: AC

## 2013-03-04 MED ORDER — DSS 100 MG PO CAPS: 100.0000 mg | ORAL_CAPSULE | Freq: Two times a day (BID) | ORAL | Status: DC

## 2013-03-04 MED ORDER — LEVOFLOXACIN 750 MG PO TABS: 750.0000 mg | ORAL_TABLET | Freq: Every day | ORAL | Status: DC

## 2013-03-04 MED ORDER — HYDRALAZINE HCL 25 MG PO TABS: 25.0000 mg | ORAL_TABLET | Freq: Three times a day (TID) | ORAL | Status: DC

## 2013-03-04 MED ORDER — POLYETHYLENE GLYCOL 3350 17 G PO PACK: 17.0000 g | PACK | Freq: Every day | ORAL | Status: DC | PRN

## 2013-03-04 NOTE — Progress Notes (Signed)
Clinical Social Work  CSW continues to search for placement. Dtr toured [place] and CSW called [facility] who reports that MD still has not made a decision regarding if they could accept patient or not. CSW spoke with Marylene Land at [facility] who reports she will come and evaluate patient at the hospital today.  CSW spoke with dtr about expanding search to other counties in case [place] and [place] cannot accept patient. Dtr prefers that patient stay in [facility] but agreeable to expand search. Patient information faxed to [facility] and [facility]. CSW will continue to follow.  [place], [place] [phone]

## 2013-03-04 NOTE — Progress Notes (Signed)
Clinical Social Work  CSW received bed offer from [facility]. CSW faxed DC summary to facility and dtr is agreeable to complete Medicaid application and paperwork at SNF today. Dtr will call CSW as soon as Medicaid application is completed at [employer] and CSW has spoken to Interior and spatial designer regarding completing Letter of Guarantee for patient to be admitted to SNF.  CSW will continue to follow.  [place], [place] [phone]

## 2013-03-04 NOTE — Progress Notes (Signed)
Physical Therapy Treatment Patient Details Name: Mark [name] MRN: [id number] DOB: Sep 05, 1951 Today's Date: 03/04/2013 Time: 4098-1191 PT Time Calculation (min): 19 min  PT Assessment / Plan / Recommendation Comments on Treatment Session  pt very groggy again this session, at risk for falls, willing to participate with PT; continues to require +2 for amb for safety    Follow Up Recommendations  SNF;Supervision/Assistance - 24 hour     Does the patient have the potential to tolerate intense rehabilitation     Barriers to Discharge        Equipment Recommendations  None recommended by PT    Recommendations for Other Services    Frequency Min 3X/week   Plan Discharge plan remains appropriate;Frequency remains appropriate    Precautions / Restrictions Precautions Precautions: Fall Precaution Comments: monitor O2 Restrictions Weight Bearing Restrictions: No   Pertinent Vitals/Pain     Mobility  Bed Mobility Bed Mobility: Supine to Sit;Sitting - Scoot to Edge of Bed Supine to Sit: 4: Min guard Sitting - Scoot to [place] of Bed: 4: Min guard Details for Bed Mobility Assistance: increased time and repeated cues for  participation Transfers Transfers: Sit to Stand;Stand to Sit Sit to Stand: 4: Min assist;With upper extremity assist;From bed Stand to Sit: 4: Min assist;To chair/3-in-1;Without upper extremity assist Details for Transfer Assistance: Assist to rise, steady and ensure controlled descent with cues for hand placement and safety.   Ambulation/Gait Ambulation/Gait Assistance: 1: +2 Total assist Ambulation/Gait: Patient Percentage: 70% Ambulation Distance (Feet): 22 Feet Assistive device: Rolling walker Ambulation/Gait Assistance Details: verbal and tactile cues for sequenceing/technique with RW, maintaining upright posture  and breathign; Pt stopped and stated he had to sit after above distance, chair to pt to prevent fall Gait Pattern: Step-to pattern;Decreased stride  length;Trendelenburg;Antalgic;Lateral hip instability;Lateral trunk lean to left;Trunk flexed Gait velocity: decreased General Gait Details: O2 sats 89-95% on RA with amb; O2 replaced at rest/pt returned to room via recliner    Exercises     PT Diagnosis:    PT Problem List:   PT Treatment Interventions:     PT Goals Acute Rehab PT Goals Time For Goal Achievement: 03/06/13 Potential to Achieve Goals: Fair Pt will go Supine/Side to Sit: with supervision PT Goal: Supine/Side to Sit - Progress: Progressing toward goal Pt will go Sit to Supine/Side: with supervision PT Goal: Sit to Supine/Side - Progress: Progressing toward goal Pt will go Sit to Stand: with supervision PT Goal: Sit to Stand - Progress: Progressing toward goal Pt will go Stand to Sit: with supervision PT Goal: Stand to Sit - Progress: Progressing toward goal Pt will Ambulate: 51 - 150 feet;with supervision;with least restrictive assistive device PT Goal: Ambulate - Progress: Progressing toward goal  Visit Information  Last PT Received On: 03/04/13 Assistance Needed: +2    Subjective Data  Subjective: pt sleeping Patient Stated Goal: none stated   Cognition  Cognition Arousal/Alertness: Awake/alert Behavior During Therapy: WFL for tasks assessed/performed Overall Cognitive Status: Within Functional Limits for tasks assessed    Balance     End of Session PT - End of Session Equipment Utilized During Treatment: Gait belt Activity Tolerance: Patient limited by fatigue Patient left: in chair;with call bell/phone within reach;with chair alarm set   GP     [facility] 03/04/2013, 11:51 AM

## 2013-03-04 NOTE — Discharge Summary (Signed)
Physician Discharge Summary  Patient ID: Mark [name] MRN: [id number] DOB/AGE: 63/10/1950 63 y.o.  Admit date: 02/26/2013 Discharge date: 03/04/2013  Primary Care Physician:  No primary provider on file.   Discharge Diagnoses:    Principal Problem:   Acute respiratory failure with hypoxia Active Problems:   CAP (community acquired pneumonia)   Essential hypertension, benign   Anemia of chronic disease   Acute kidney failure   Leukocytosis, unspecified      Medication List    STOP taking these medications       methadone 10 MG tablet  Commonly known as:  DOLOPHINE      TAKE these medications       aspirin 81 MG EC tablet  Take 1 tablet (81 mg total) by mouth daily.     clopidogrel 75 MG tablet  Commonly known as:  PLAVIX  Take 75 mg by mouth daily.     DSS 100 MG Caps  Take 100 mg by mouth 2 (two) times daily.     hydrALAZINE 25 MG tablet  Commonly known as:  APRESOLINE  Take 1 tablet (25 mg total) by mouth every 8 (eight) hours.     levofloxacin 750 MG tablet  Commonly known as:  LEVAQUIN  Take 1 tablet (750 mg total) by mouth daily. For 5 days     metoprolol succinate 100 MG 24 hr tablet  Commonly known as:  TOPROL-XL  Take 200 mg by mouth daily with breakfast. Take with or immediately following a meal.     multivitamin with minerals Tabs  Take 1 tablet by mouth daily.     polyethylene glycol packet  Commonly known as:  MIRALAX / GLYCOLAX  Take 17 g by mouth daily as needed.     verapamil 80 MG tablet  Commonly known as:  CALAN  Take 40 mg by mouth daily.         Disposition and Follow-up:  Will be discharged to SNF today in stable and improved condition. Will complete 5 more days of levaquin for his CAP.  Consults:  None   Significant Diagnostic Studies:  Mr Brain Wo Contrast  03/02/2013  *RADIOLOGY REPORT*  Clinical Data: Lethargic.  Lung cancer.  Rule out metastatic disease.  MRI HEAD WITHOUT CONTRAST  Technique:  Multiplanar, multiecho  pulse sequences of the brain and surrounding structures were obtained according to standard protocol without intravenous contrast.  Comparison: None.  Findings: Incomplete study.  The patient was  moving and was  not able to complete the unenhanced portion of the study.  Intravenous contrast was not administered.  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  Chronic appearing ischemic changes in the pons and left cerebellum.  Hyperintensity left brachium pontes appears to  represent chronic ischemia.  Negative for acute infarct.  Negative for mass lesion.  IMPRESSION: Moderate to advanced chronic ischemic changes.  No acute infarct.  Given the history, a follow-up repeat study with contrast is suggested when the patient is able to tolerate the study.   Original Report Authenticated By: Janeece Riggers, M.D.     Brief H and P: For complete details please refer to admission H and P, but in brief patient is a 62 year old male with past medical history significant for lung cancer status post partial lobectomy on the left side without chemotherapy or radiation therapy, hypertension who presented [facility] ED with worsening shortness of breath for past few days prior to this admission.  Patient reported chronic intermittent dry cough. No chest pain. No palpitations. No complaints of abdominal pain, nausea or vomiting. No diarrhea or constipation. No fever or chills. No lightheadedness or loss of consciousness. No weakness.  In ED, evaluation included chest x-ray which was significant for left lower lobe consolidation. CT chest was significant for bilateral pneumonia with consolidation air bronchograms on the right. bilateral partially loculated pleural effusions but no clear evidence of metastatic disease. We were asked to admit him for further evaluation and management.     Hospital Course:  Principal Problem:   Acute respiratory failure with hypoxia Active  Problems:   CAP (community acquired pneumonia)   Essential hypertension, benign   Anemia of chronic disease   Acute kidney failure   Leukocytosis, unspecified   Acute Respiratory Failure with Hypoxia -2/2 CAP. -With minimal oxygen requirements of 2 L.  CAP -Will transition to PO antibiotics today: Levaquin for 5 more days. -All cx data is negative to date.  HTN -Well controlled.  QT Prolongation -Methadone has been discontinued. -Not complaining of pain at present.  Transaminitis -Improving. -No further nausea/vomiting. -MRI with liver lesion that was biopsy-proven to be a cyst (per records from a hospital in [place]). -[place] with cholelithiasis but without signs of CBD obstruction. -No complaints of abdominal pain. -If he has abdominal pain, would repeat transaminases and US/CT abdomen.  Rest of chronic medical conditions have been stable this hospitalization.  Time spent on Discharge: Greater than 30 minutes.  SignedChaya Jan [facility] Pager: [phone] 03/04/2013, 10:59 AM

## 2013-03-04 NOTE — Progress Notes (Signed)
Patient dressed, IV d/c, catheter intact, site unremarkable.  Belongings packed, awaiting ambulance for transport to [facility].

## 2013-03-04 NOTE — Progress Notes (Signed)
Attempted to order this patient's breakfast for second time this am.  Patient states he will wait until his sister arrives to order his meal

## 2013-03-04 NOTE — Progress Notes (Signed)
Clinical Social Work  CSW faxed DC summary to [facility] and Civil engineer, contracting of Guarantee to facility. Dtr has completed Medicaid application and completed paperwork with SNF. CSW informed patient, dtr and RN of DC and all parties agreeable to DC. CSW prepared DC packet and placed in [place]. CSW coordinated transportation via [place] and is signing off.  [place], [place] [phone]

## 2013-03-05 LAB — CULTURE, BLOOD (ROUTINE X 2): Culture: NO GROWTH

## 2013-03-05 LAB — GLUCOSE, CAPILLARY

## 2013-03-06 ENCOUNTER — Telehealth: Payer: Self-pay | Admitting: Internal Medicine

## 2013-03-06 NOTE — Telephone Encounter (Signed)
S/w pt in re NP appt 05/12 @ 3:30 w/Dr. Arbutus Ped.  Pt confirmed appt.

## 2013-03-09 ENCOUNTER — Ambulatory Visit (HOSPITAL_BASED_OUTPATIENT_CLINIC_OR_DEPARTMENT_OTHER): Payer: Self-pay | Admitting: Internal Medicine

## 2013-03-09 ENCOUNTER — Encounter: Payer: Self-pay | Admitting: Internal Medicine

## 2013-03-09 ENCOUNTER — Other Ambulatory Visit: Payer: Self-pay | Admitting: Lab

## 2013-03-09 ENCOUNTER — Other Ambulatory Visit: Payer: Self-pay | Admitting: Medical Oncology

## 2013-03-09 ENCOUNTER — Ambulatory Visit: Payer: Self-pay

## 2013-03-09 VITALS — BP 155/92 | HR 68 | Temp 97.0°F | Resp 20 | Ht 71.5 in | Wt 144.0 lb

## 2013-03-09 DIAGNOSIS — D638 Anemia in other chronic diseases classified elsewhere: Secondary | ICD-10-CM

## 2013-03-09 DIAGNOSIS — R0602 Shortness of breath: Secondary | ICD-10-CM

## 2013-03-09 DIAGNOSIS — C3430 Malignant neoplasm of lower lobe, unspecified bronchus or lung: Secondary | ICD-10-CM

## 2013-03-09 NOTE — Patient Instructions (Signed)
There is a concern about lung cancer recurrence on recent scans. I will order PET scan for further evaluation. Followup visit in 2 weeks.

## 2013-03-09 NOTE — Progress Notes (Signed)
[facility] [phone]   [phone]  CONSULT NOTE  REFERRING PHYSICIAN: Dr. Ardyth Harps  REASON FOR CONSULTATION:  62 years old African American male with history of lung cancer with questionable recurrent.  HPI Mark [name] is a 62 y.o. male was past medical history significant for hypertension, stroke in 2009 with residual left-sided weakness, history of drug abuse, depression, seizure disorder and long history of smoking. The patient used to live in [place] and moved recently to [place] to be close to his daughter. He mentioned that in May of 2013 he tripped in his apartment and broke his left hip. During his evaluation chest x-ray was performed and it showed solitary lesion in the left lung. This was confirmed with further imaging studies as well as CT-guided biopsy which was consistent with adenocarcinoma of lung primary. CT of the abdomen at that time as well as MRI of the abdomen showed a questionable lesion in the liver but CT-guided biopsy showed simple cyst. The patient underwent left lower lobectomy under the care of Dr.Lapunzina in [place] for a stage I non-small cell lung cancer. He was admitted to the [facility] in [place] for questionable pneumonia and he was told that it is suspicious lung cancer recurrence.  The patient is recovering well from his surgery and recently to [name]. He was admitted to [facility] on may first 2014 with questionable pneumonia. CT scan of the chest abdomen and pelvis on 02/26/2013 showed bilateral pneumonia with consolidation air bronchograms on the right. There was bilateral partially loculated pleural effusions but no clear evidence of metastatic disease.  CT of the abdomen showed again the indeterminate lesion in the anterior left hepatic lobe but this was biopsied in the past and was consistent with hepatic cyst. There was also small nodule within the left adrenal gland.  The patient  is currently a resident of the [facility] skilled nursing facility. He came with the transportation person from the facility in a wheelchair.  He is feeling fine today with no specific complaints except for shortness breath with exertion but no significant chest pain, cough or hemoptysis. He lost a few pounds recently but started getting it again. He is anxious about the possibility of disease recurrence.  @SFHPI @  Past Medical History  Diagnosis Date  . Cancer     lung  . Essential hypertension, benign 02/26/2013    Past Surgical History  Procedure Laterality Date  . Left hip surgery    . Lung surgery-lobectomy      History reviewed. No pertinent family history.  Social History History  Substance Use Topics  . Smoking status: Former Smoker    Types: Cigarettes    Quit date: 02/27/2012  . Smokeless tobacco: Never Used  . Alcohol Use: No    No Known Allergies  Current Outpatient Prescriptions  Medication Sig Dispense Refill  . aspirin EC 81 MG EC tablet Take 1 tablet (81 mg total) by mouth daily.      . clopidogrel (PLAVIX) 75 MG tablet Take 75 mg by mouth daily.      Marland Kitchen docusate sodium 100 MG CAPS Take 100 mg by mouth 2 (two) times daily.  10 capsule    . hydrALAZINE (APRESOLINE) 25 MG tablet Take 1 tablet (25 mg total) by mouth every 8 (eight) hours.      Marland Kitchen levofloxacin (LEVAQUIN) 750 MG tablet Take 1 tablet (750 mg total) by mouth daily. For 5 days      .  Multiple Vitamin (MULTIVITAMIN WITH MINERALS) TABS Take 1 tablet by mouth daily.      . polyethylene glycol (MIRALAX / GLYCOLAX) packet Take 17 g by mouth daily as needed.  14 each    . verapamil (CALAN) 80 MG tablet Take 40 mg by mouth daily.       No current facility-administered medications for this visit.    Review of Systems  A comprehensive review of systems was negative except for: Constitutional: positive for fatigue Respiratory: positive for dyspnea on exertion Musculoskeletal: positive for muscle  weakness  Physical Exam  GNF:AOZHY, healthy, no distress and malnourished SKIN: skin color, texture, turgor are normal HEAD: Normocephalic EYES: normal, PERRLA EARS: External ears normal OROPHARYNX:no exudate and no erythema  NECK: supple, no adenopathy LYMPH:  no palpable lymphadenopathy, no hepatosplenomegaly LUNGS: clear to auscultation  HEART: regular rate & rhythm and no murmurs ABDOMEN:abdomen soft, non-tender, normal bowel sounds and no masses or organomegaly BACK: Back symmetric, no curvature. EXTREMITIES:no edema, no skin discoloration, no clubbing  NEURO: alert & oriented x 3 with fluent speech, no focal motor/sensory deficits  PERFORMANCE STATUS: ECOG 2  LABORATORY DATA: Lab Results  Component Value Date   WBC 11.2* 03/04/2013   HGB 9.8* 03/04/2013   HCT 31.3* 03/04/2013   MCV 82.2 03/04/2013   PLT 402* 03/04/2013      Chemistry      Component Value Date/Time   NA 136 03/04/2013 0500   K 3.4* 03/04/2013 0500   CL 102 03/04/2013 0500   CO2 27 03/04/2013 0500   BUN 16 03/04/2013 0500   CREATININE 0.93 03/04/2013 0500      Component Value Date/Time   CALCIUM 8.2* 03/04/2013 0500   ALKPHOS 178* 03/04/2013 0500   AST 340* 03/04/2013 0500   ALT 252* 03/04/2013 0500   BILITOT 0.7 03/04/2013 0500       RADIOGRAPHIC STUDIES: Dg Chest 2 View  03/01/2013  *RADIOLOGY REPORT*  Clinical Data: Shortness of breath.  CHEST - 2 VIEW  Comparison: 02/26/2013  Findings: Bilateral airspace opacities are noted with bilateral small to moderate effusions, left greater than right.  Findings are similar or possibly slightly progressed since prior study.  Heart is mildly enlarged.  No acute bony abnormality.  IMPRESSION: Slight progression of bilateral airspace disease and bilateral effusions.   Original Report Authenticated By: Charlett Nose, M.D.    Dg Chest 2 View  02/26/2013  *RADIOLOGY REPORT*  Clinical Data: History of lung carcinoma with chest pain and shortness of breath  CHEST - 2 VIEW  Comparison: None.   Findings:  There is airspace consolidation throughout much of the left lower lobe as well as more patchy infiltrate throughout the right mid and lower lung zones.  There is loculated left effusion.  Heart is mildly enlarged with normal pulmonary vascularity.  There is no convincing adenopathy.  No bone lesions are appreciable.  IMPRESSION: Airspace disease bilaterally with consolidation in the left lower lobe, particularly involving the superior segment.  An underlying mass cannot be excluded. Given the clinical history and absence of prior studies to compare, correlation with chest CT, ideally with intravenous contrast, at this time may well be advisable to further assess.   Original Report Authenticated By: Bretta Bang, M.D.    Ct Chest W Contrast  02/26/2013  *RADIOLOGY REPORT*  Clinical Data:  Lung cancer, short of breath, chest pain  CT CHEST, ABDOMEN AND PELVIS WITH CONTRAST  Technique:  Multidetector CT imaging of the chest, abdomen and pelvis was  performed following the standard protocol during bolus administration of intravenous contrast.  Contrast: 50mL OMNIPAQUE IOHEXOL 300 MG/ML  SOLN, OMNIPAQUE IOHEXOL 300 MG/ML  SOLN  Comparison:  Chest radiograph 02/26/2013  CT CHEST  Findings:  No axillary or supraclavicular lymphadenopathy.  No mediastinal or hilar lymphadenopathy  8 and 9 mm right lower paratracheal lymph nodes are noted.  There is oral contrast within the esophagus.  Review of the lung parenchyma demonstrates bilateral pleural effusions which are partially loculated on the left.  There is air space consolidation with air bronchograms in the right lower lobe. More diffuse air space disease within the left lower lobe and lingula.  No focal mass lesion identified.  No central obstructing lesion evident within the left or right bronchial tree.  IMPRESSION:  1.  Bilateral pneumonia with consolidation air bronchograms on the right. 2.  Bilateral partially loculated pleural effusions. 3.   No clear evidence of metastatic disease.  CT ABDOMEN AND PELVIS  Findings:  Low density lesion in the anterior aspect the right hepatic lobe measuring 27 x 11 mm (image 62).  No biliary duct dilatation.  A small gallstone in the gallbladder.  The pancreas, spleen, adrenal glands, and right adrenal gland are normal.  There is small nodule within the left adrenal gland measuring 7 mm. Kidneys are normal.  The stomach, small bowel, appendix, cecum are normal.  The colon and rectosigmoid colon are normal.  Abdominal aorta normal caliber.  No retroperitoneal or periportal lymphadenopathy.  Review of  bone windows demonstrates no aggressive osseous lesions.  IMPRESSION:  1. Indeterminate lesion in the anterior left hepatic lobe. Recommend MRI without and with contrast preferably in the outpatient setting when the patient can hold his breath.  2.  Contrast within the esophagus suggests gastroesophageal reflux disease.  3.  Small nodule within the left adrenal gland.  Consider evaluation with recommend MRI as above.   Original Report Authenticated By: Genevive Bi, M.D.    Mr Brain Wo Contrast  03/02/2013  *RADIOLOGY REPORT*  Clinical Data: Lethargic.  Lung cancer.  Rule out metastatic disease.  MRI HEAD WITHOUT CONTRAST  Technique:  Multiplanar, multiecho pulse sequences of the brain and surrounding structures were obtained according to standard protocol without intravenous contrast.  Comparison: None.  Findings: Incomplete study.  The patient was  moving and was  not able to complete the unenhanced portion of the study.  Intravenous contrast was not administered.  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  Chronic appearing ischemic changes in the pons and left cerebellum.  Hyperintensity left brachium pontes appears to  represent chronic ischemia.  Negative for acute infarct.  Negative for mass lesion.  IMPRESSION: Moderate to advanced chronic ischemic  changes.  No acute infarct.  Given the history, a follow-up repeat study with contrast is suggested when the patient is able to tolerate the study.   Original Report Authenticated By: Janeece Riggers, M.D.    US Abdomen Complete  03/02/2013  *RADIOLOGY REPORT*  Clinical Data:  Elevated liver function tests and history of lung carcinoma.  ABDOMEN ULTRASOUND  Technique:  Complete abdominal ultrasound examination was performed including evaluation of the liver, gallbladder, bile ducts, pancreas, kidneys, spleen, IVC, and abdominal aorta.  Comparison:  CT of the abdomen on 02/26/2013  Findings:  Gallbladder:  A few mobile calculi are identified in the gallbladder.  There is no evidence of gallbladder wall thickening or sonographic Murphy's sign.  Common Bile  Duct:  Normal caliber of 5 mm.  Liver:  Subcapsular cyst of the anterior left lobe of the liver corresponds to the abnormality seen by CT and measures approximately 2.4 cm in greatest diameter by ultrasound.  This has the appearance of a benign cyst.  No evidence of solid lesions in the liver or biliary ductal dilatation.  IVC:  Patent throughout its visualized course in the abdomen.  Pancreas:  Although the pancreas is difficult to visualize in its entirety, no focal pancreatic abnormality is identified.  Spleen:  The spleen shows normal echotexture and size.  Kidneys:  The right kidney measures 10.6 cm and the left kidney 10.7 cm.  Benign appearing left renal cyst present.  No evidence of hydronephrosis or solid renal lesions bilaterally.  Abdominal Aorta:  Normal caliber abdominal aorta.  IMPRESSION:  1.  Cholelithiasis without evidence by ultrasound of overt cholecystitis or biliary dilatation. 2.  Benign appearing subcapsular cyst of the anterior left lobe of the liver.   Original Report Authenticated By: Irish Lack, M.D.    Ct Abdomen Pelvis W Contrast  02/26/2013  *RADIOLOGY REPORT*  Clinical Data:  Lung cancer, short of breath, chest pain  CT CHEST,  ABDOMEN AND PELVIS WITH CONTRAST  Technique:  Multidetector CT imaging of the chest, abdomen and pelvis was performed following the standard protocol during bolus administration of intravenous contrast.  Contrast: 50mL OMNIPAQUE IOHEXOL 300 MG/ML  SOLN, OMNIPAQUE IOHEXOL 300 MG/ML  SOLN  Comparison:  Chest radiograph 02/26/2013  CT CHEST  Findings:  No axillary or supraclavicular lymphadenopathy.  No mediastinal or hilar lymphadenopathy  8 and 9 mm right lower paratracheal lymph nodes are noted.  There is oral contrast within the esophagus.  Review of the lung parenchyma demonstrates bilateral pleural effusions which are partially loculated on the left.  There is air space consolidation with air bronchograms in the right lower lobe. More diffuse air space disease within the left lower lobe and lingula.  No focal mass lesion identified.  No central obstructing lesion evident within the left or right bronchial tree.  IMPRESSION:  1.  Bilateral pneumonia with consolidation air bronchograms on the right. 2.  Bilateral partially loculated pleural effusions. 3.  No clear evidence of metastatic disease.  CT ABDOMEN AND PELVIS  Findings:  Low density lesion in the anterior aspect the right hepatic lobe measuring 27 x 11 mm (image 62).  No biliary duct dilatation.  A small gallstone in the gallbladder.  The pancreas, spleen, adrenal glands, and right adrenal gland are normal.  There is small nodule within the left adrenal gland measuring 7 mm. Kidneys are normal.  The stomach, small bowel, appendix, cecum are normal.  The colon and rectosigmoid colon are normal.  Abdominal aorta normal caliber.  No retroperitoneal or periportal lymphadenopathy.  Review of  bone windows demonstrates no aggressive osseous lesions.  IMPRESSION:  1. Indeterminate lesion in the anterior left hepatic lobe. Recommend MRI without and with contrast preferably in the outpatient setting when the patient can hold his breath.  2.  Contrast within  the esophagus suggests gastroesophageal reflux disease.  3.  Small nodule within the left adrenal gland.  Consider evaluation with recommend MRI as above.   Original Report Authenticated By: Genevive Bi, M.D.     ASSESSMENT: This is a very pleasant 62 years old [place] American male with history of stage I non-small cell lung cancer, adenocarcinoma status post left lower lobectomy in September of 2009. His surgery was performed in [place]. The patient was told about questionable disease recurrence when he was admitted to the [facility] in [place]. His recent scans showed bilateral pneumonia as well as loculated pleural effusion.   PLAN: I have a lengthy discussion with the patient today about his condition. I'm not sure if the patient has any evidence for disease recurrence based on the recent imaging in [place].  I recommended for the patient to have a PET scan performed for further evaluation and to rule out the possibility of any disease recurrence. I would see him back for followup visit in 2 weeks for evaluation and discussion of his PET scan results. If no evidence for disease recurrence on the PET scan, I will continue the patient on observation with repeat imaging in 6 months. The patient agreed to the current plan. He was advised to call if he has any concerning symptoms in the interval.  All questions were answered. The patient knows to call the clinic with any problems, questions or concerns. We can certainly see the patient much sooner if necessary.  Thank you so much for allowing me to participate in the care of Mark [name]. I will continue to follow up the patient with you and assist in his care.  I spent 30 minutes counseling the patient face to face. The total time spent in the appointment was 55 minutes.  Mark Boger K. 03/09/2013, 5:14 PM

## 2013-03-09 NOTE — Progress Notes (Signed)
Checked in new patient. The lab is closed so I checked in for dr. The patient was late. He said his daughter filled out medicaid papers about 2 months ago and it is ok to speak with her- Delice Bison- I will forward to Lenise to follow up with medicaid.

## 2013-03-10 ENCOUNTER — Encounter: Payer: Self-pay | Admitting: Internal Medicine

## 2013-03-10 ENCOUNTER — Non-Acute Institutional Stay (SKILLED_NURSING_FACILITY): Payer: Self-pay | Admitting: Internal Medicine

## 2013-03-10 ENCOUNTER — Telehealth: Payer: Self-pay | Admitting: Internal Medicine

## 2013-03-10 DIAGNOSIS — J189 Pneumonia, unspecified organism: Secondary | ICD-10-CM

## 2013-03-10 DIAGNOSIS — N179 Acute kidney failure, unspecified: Secondary | ICD-10-CM

## 2013-03-10 DIAGNOSIS — C3430 Malignant neoplasm of lower lobe, unspecified bronchus or lung: Secondary | ICD-10-CM

## 2013-03-10 NOTE — Progress Notes (Signed)
Patient ID: Mark [name], male   DOB: [birth year], 62 y.o.   MRN: [id number] Chief complaint; admission to SNF, status post [place] May 1 through 03/04/2013  History patient is a 62 year old man with a past history relevant for lung cancer, status post lobectomy on the left side. This was non-small cell. He did not apparently received chemoradiation. He is followed by Dr. Shirline Frees in the cancer Center and is due for a PET scan. In any case on this admission. He came in with worsening shortness of breath. The, dry cough. No chest pain. CT scan of the chest showed bilateral pneumonia with consolidation and air bronchograms on the right. Bilaterally partially loculated pleural effusion, but no clear evidence of metastatic disease.   has a past medical history of Cancer and Essential hypertension, benign (02/26/2013).  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 Hypertension. Anemia of chronic disease. Acute renal failure. Unspecified leukocytosis Community acquired pneumonia, discharged on Levaquin  .social hx. former smoker. On chronic oxygen. Moved from [place] to be close to family. Use a cane at home.  Family. His none according to the.  Review of systems Respiratory no cough or shortness of breath. Cardiac no chest pain. GI no nausea, vomiting, or altered bowel habit. GU no dysuria.  Physical exam; Gen. patient is in no distress. Oxygen on. Respiratory decreased air entry at both bases, but no crackles or wheezes. No clubbing trachea is midline. Cardiac heart sounds are normal. JVP is not elevated. Abdomen no liver no spleen no tenderness. Musculoskeletal osteoarthritis of both knees. Neurologic not lateralizing exams. Good strength in lower extremities. Did not attempt to ambulate him  Impression/plan #1 status post admission with bilateral pneumonia. Completing treatment. #2 status post lobectomy on the left for non-small cell lung cancer. #3 that is post CVA with  infarctions present in the basal ganglia and deep white matter bilaterally on MRI of the head. Moderate to advanced chronic ischemic change. #4 COPD on chronic oxygen.  Not on any bronchodilator therapy. However. #5 essential hypertension. Will monitor. #6 anemia of chronic renal failure with acute renal failure. This will need to be followed.  I did not attempt to ambulate him. mental status seemed normal. If he is functional in terms of mobility and ADLs. He should be able to return home soon. Dr. Tanja Port following with the PET scan. Status of his underlying disease was unclear on recent CT scan.

## 2013-03-10 NOTE — Telephone Encounter (Signed)
sw. pt and advised on 5.28.14 appt...advised pt that cs will contact with d/t for PET

## 2013-03-10 NOTE — Progress Notes (Signed)
Spoke to Ms. Mark [name], she stated that pt applied for family & children Medicaid, he needs to apply for adult Medicaid.  If he does he will need to meet a $4,794 deductible.  I will relay this to pt's daughter, Mark [name].

## 2013-03-10 NOTE — Telephone Encounter (Signed)
s.w. [phone] @ [facility] and advised them about pt appts

## 2013-03-10 NOTE — Progress Notes (Signed)
Medicaid rep informed me that pt has a deductible to meet before he can get Medicaid.  I left message for Ms. Heath Gold (case worker) at [phone] to return my call to give me the deductible amount.

## 2013-03-11 ENCOUNTER — Telehealth: Payer: Self-pay | Admitting: Medical Oncology

## 2013-03-11 NOTE — Telephone Encounter (Signed)
Confirmed PET and f/u appt with Delice Bison -pts daughter

## 2013-03-17 ENCOUNTER — Encounter (HOSPITAL_COMMUNITY)
Admission: RE | Admit: 2013-03-17 | Discharge: 2013-03-17 | Disposition: A | Discharge status: Home / Self Care | Payer: Medicaid Other | Source: Ambulatory Visit | Attending: Internal Medicine | Admitting: Internal Medicine

## 2013-03-17 DIAGNOSIS — C3490 Malignant neoplasm of unspecified part of unspecified bronchus or lung: Secondary | ICD-10-CM | POA: Insufficient documentation

## 2013-03-17 DIAGNOSIS — D638 Anemia in other chronic diseases classified elsewhere: Secondary | ICD-10-CM

## 2013-03-17 DIAGNOSIS — Z902 Acquired absence of lung [part of]: Secondary | ICD-10-CM | POA: Insufficient documentation

## 2013-03-17 DIAGNOSIS — M899 Disorder of bone, unspecified: Secondary | ICD-10-CM | POA: Insufficient documentation

## 2013-03-17 DIAGNOSIS — M949 Disorder of cartilage, unspecified: Secondary | ICD-10-CM | POA: Insufficient documentation

## 2013-03-17 DIAGNOSIS — Q619 Cystic kidney disease, unspecified: Secondary | ICD-10-CM | POA: Insufficient documentation

## 2013-03-17 DIAGNOSIS — K8020 Calculus of gallbladder without cholecystitis without obstruction: Secondary | ICD-10-CM | POA: Insufficient documentation

## 2013-03-17 MED ORDER — FLUDEOXYGLUCOSE F - 18 (FDG) INJECTION
14.4000 | Freq: Once | INTRAVENOUS | Status: AC | PRN
  Administered 2013-03-17: 14.4 via INTRAVENOUS

## 2013-03-20 ENCOUNTER — Telehealth: Payer: Self-pay | Admitting: Dietician

## 2013-03-24 ENCOUNTER — Encounter (HOSPITAL_COMMUNITY): Payer: Self-pay | Admitting: Adult Health

## 2013-03-24 ENCOUNTER — Emergency Department (HOSPITAL_COMMUNITY)
Admission: EM | Admit: 2013-03-24 | Discharge: 2013-03-24 | Disposition: A | Discharge status: Home / Self Care | Payer: Medicaid Other | Attending: Emergency Medicine | Admitting: Emergency Medicine

## 2013-03-24 DIAGNOSIS — Z8781 Personal history of (healed) traumatic fracture: Secondary | ICD-10-CM | POA: Insufficient documentation

## 2013-03-24 DIAGNOSIS — Z760 Encounter for issue of repeat prescription: Secondary | ICD-10-CM | POA: Insufficient documentation

## 2013-03-24 DIAGNOSIS — Z8701 Personal history of pneumonia (recurrent): Secondary | ICD-10-CM | POA: Insufficient documentation

## 2013-03-24 DIAGNOSIS — Z85118 Personal history of other malignant neoplasm of bronchus and lung: Secondary | ICD-10-CM | POA: Insufficient documentation

## 2013-03-24 DIAGNOSIS — Z7902 Long term (current) use of antithrombotics/antiplatelets: Secondary | ICD-10-CM | POA: Insufficient documentation

## 2013-03-24 DIAGNOSIS — Z9981 Dependence on supplemental oxygen: Secondary | ICD-10-CM | POA: Insufficient documentation

## 2013-03-24 DIAGNOSIS — I10 Essential (primary) hypertension: Secondary | ICD-10-CM | POA: Insufficient documentation

## 2013-03-24 DIAGNOSIS — Z87891 Personal history of nicotine dependence: Secondary | ICD-10-CM | POA: Insufficient documentation

## 2013-03-24 DIAGNOSIS — Z79899 Other long term (current) drug therapy: Secondary | ICD-10-CM | POA: Insufficient documentation

## 2013-03-24 DIAGNOSIS — Z7982 Long term (current) use of aspirin: Secondary | ICD-10-CM | POA: Insufficient documentation

## 2013-03-24 NOTE — ED Notes (Signed)
Per son pt was just d/c from hospital and needs o2 cause his broke

## 2013-03-24 NOTE — ED Provider Notes (Signed)
History    This chart was scribed for non-physician practitioner, Arnoldo Hooker PA-C working with Glynn Octave, MD by Donne Anon, ED Scribe. This patient was seen in room Room/bed info not found and the patient's care was started at 1510.   CSN: [id number]  Arrival date & time 03/24/13  1431   First MD Initiated Contact with Patient 03/24/13 1510      Chief Complaint  Patient presents with  . Medication Refill     The history is provided by the patient and a relative. No language interpreter was used.  HPI Comments: Mark [name] is a 62 y.o. male who presents to the Emergency Department needing a medication refill. He reports he fractured his hip in September 2013 and when he had an operation to repair it the physician discovered the beginning of lung caner and he subsequently underwent a partial lobotomy. He reports he has been on oxygen since September. He was recently hospitalized for pneumonia. He reports that the home oxygen machine broke today and he needs a new prescription for his machine. He denies any SOB at this time.   Past Medical History  Diagnosis Date  . Cancer     lung  . Essential hypertension, benign 02/26/2013    Past Surgical History  Procedure Laterality Date  . Left hip surgery    . Lung surgery-lobectomy      History reviewed. No pertinent family history.  History  Substance Use Topics  . Smoking status: Former Smoker    Types: Cigarettes    Quit date: 02/27/2012  . Smokeless tobacco: Never Used  . Alcohol Use: No      Review of Systems  Respiratory: Negative for shortness of breath and wheezing.   All other systems reviewed and are negative.    Allergies  Review of patient's allergies indicates no known allergies.  Home Medications   Current Outpatient Rx  Name  Route  Sig  Dispense  Refill  . aspirin EC 81 MG EC tablet   Oral   Take 1 tablet (81 mg total) by mouth daily.         . clopidogrel (PLAVIX) 75 MG tablet    Oral   Take 75 mg by mouth daily.         Marland Kitchen docusate sodium 100 MG CAPS   Oral   Take 100 mg by mouth 2 (two) times daily.   10 capsule      . hydrALAZINE (APRESOLINE) 25 MG tablet   Oral   Take 1 tablet (25 mg total) by mouth every 8 (eight) hours.         Marland Kitchen levofloxacin (LEVAQUIN) 750 MG tablet   Oral   Take 1 tablet (750 mg total) by mouth daily. For 5 days         . Multiple Vitamin (MULTIVITAMIN WITH MINERALS) TABS   Oral   Take 1 tablet by mouth daily.         . polyethylene glycol (MIRALAX / GLYCOLAX) packet   Oral   Take 17 g by mouth daily as needed.   14 each      . verapamil (CALAN) 80 MG tablet   Oral   Take 40 mg by mouth daily.           BP 167/81  Pulse 72  Temp(Src) 98.1 F (36.7 C) (Oral)  Wt 154 lb (69.854 kg)  BMI 21.18 kg/m2  SpO2 99%  Physical Exam  Nursing note and vitals reviewed.  Constitutional: He is oriented to person, place, and time. He appears well-developed and well-nourished. No distress.  HENT:  Head: Normocephalic and atraumatic.  Eyes: EOM are normal.  Neck: Neck supple. No tracheal deviation present.  Cardiovascular: Normal rate, regular rhythm and normal heart sounds.   Pulmonary/Chest: Effort normal and breath sounds normal. No respiratory distress. He has no wheezes. He has no rales. He exhibits no tenderness.  Musculoskeletal: Normal range of motion.  Neurological: He is alert and oriented to person, place, and time.  Skin: Skin is warm and dry.  Psychiatric: He has a normal mood and affect. His behavior is normal.    ED Course  Procedures (including critical care time) DIAGNOSTIC STUDIES: Oxygen Saturation is 99% on N/C, normal by my interpretation.    COORDINATION OF CARE: 3:21 PM Discussed treatment plan which includes prescription refill with pt at bedside and pt agreed to plan.     Labs Reviewed - No data to display No results found.   1. Oxygen dependent       MDM  Patient has no acute  symptoms. He is here because he needed a prescription to continue oxygen at home until seen by his doctor in the near future. Discussed with [facility]. Rx given/faxed.   I personally performed the services described in this documentation, which was scribed in my presence. The recorded information has been reviewed and is accurate.         Arnoldo Hooker, PA-C 03/28/13 1537

## 2013-03-24 NOTE — ED Notes (Signed)
rx  For 02 [facility] to [facility] by pa. Family went over there to make sure it was received and then came back for pt

## 2013-03-25 ENCOUNTER — Ambulatory Visit: Payer: Self-pay | Admitting: Internal Medicine

## 2013-03-28 NOTE — ED Provider Notes (Signed)
Medical screening examination/treatment/procedure(s) were performed by non-physician practitioner and as supervising physician I was immediately available for consultation/collaboration.   Andrell Tallman, MD 03/28/13 1805 

## 2013-03-30 ENCOUNTER — Telehealth: Payer: Self-pay | Admitting: Dietician

## 2013-04-01 ENCOUNTER — Encounter (HOSPITAL_COMMUNITY): Payer: Self-pay | Admitting: *Deleted

## 2013-04-01 ENCOUNTER — Inpatient Hospital Stay (HOSPITAL_COMMUNITY)
Admission: EM | Admit: 2013-04-01 | Discharge: 2013-04-05 | DRG: 287 | Disposition: A | Discharge status: Home / Self Care | Payer: Medicaid Other | Attending: Internal Medicine | Admitting: Internal Medicine

## 2013-04-01 ENCOUNTER — Emergency Department (HOSPITAL_COMMUNITY): Payer: Medicaid Other

## 2013-04-01 DIAGNOSIS — I2510 Atherosclerotic heart disease of native coronary artery without angina pectoris: Secondary | ICD-10-CM

## 2013-04-01 DIAGNOSIS — D72829 Elevated white blood cell count, unspecified: Secondary | ICD-10-CM

## 2013-04-01 DIAGNOSIS — R609 Edema, unspecified: Secondary | ICD-10-CM

## 2013-04-01 DIAGNOSIS — I498 Other specified cardiac arrhythmias: Secondary | ICD-10-CM | POA: Diagnosis present

## 2013-04-01 DIAGNOSIS — J9601 Acute respiratory failure with hypoxia: Secondary | ICD-10-CM

## 2013-04-01 DIAGNOSIS — Z902 Acquired absence of lung [part of]: Secondary | ICD-10-CM

## 2013-04-01 DIAGNOSIS — E876 Hypokalemia: Secondary | ICD-10-CM | POA: Diagnosis present

## 2013-04-01 DIAGNOSIS — I5040 Unspecified combined systolic (congestive) and diastolic (congestive) heart failure: Secondary | ICD-10-CM

## 2013-04-01 DIAGNOSIS — Z7982 Long term (current) use of aspirin: Secondary | ICD-10-CM

## 2013-04-01 DIAGNOSIS — J189 Pneumonia, unspecified organism: Secondary | ICD-10-CM

## 2013-04-01 DIAGNOSIS — N179 Acute kidney failure, unspecified: Secondary | ICD-10-CM

## 2013-04-01 DIAGNOSIS — I059 Rheumatic mitral valve disease, unspecified: Secondary | ICD-10-CM

## 2013-04-01 DIAGNOSIS — I428 Other cardiomyopathies: Secondary | ICD-10-CM

## 2013-04-01 DIAGNOSIS — Z85118 Personal history of other malignant neoplasm of bronchus and lung: Secondary | ICD-10-CM

## 2013-04-01 DIAGNOSIS — I5041 Acute combined systolic (congestive) and diastolic (congestive) heart failure: Principal | ICD-10-CM | POA: Diagnosis present

## 2013-04-01 DIAGNOSIS — D638 Anemia in other chronic diseases classified elsewhere: Secondary | ICD-10-CM | POA: Diagnosis present

## 2013-04-01 DIAGNOSIS — I10 Essential (primary) hypertension: Secondary | ICD-10-CM | POA: Diagnosis present

## 2013-04-01 DIAGNOSIS — I509 Heart failure, unspecified: Secondary | ICD-10-CM | POA: Diagnosis present

## 2013-04-01 DIAGNOSIS — Z87891 Personal history of nicotine dependence: Secondary | ICD-10-CM

## 2013-04-01 DIAGNOSIS — F1010 Alcohol abuse, uncomplicated: Secondary | ICD-10-CM

## 2013-04-01 HISTORY — DX: Personal history of other medical treatment: Z92.89

## 2013-04-01 HISTORY — DX: Epilepsy, unspecified, not intractable, without status epilepticus: G40.909

## 2013-04-01 HISTORY — DX: Essential (primary) hypertension: I10

## 2013-04-01 HISTORY — DX: Dependence on supplemental oxygen: Z99.81

## 2013-04-01 HISTORY — DX: Anxiety disorder, unspecified: F41.9

## 2013-04-01 HISTORY — DX: Liver disease, unspecified: K76.9

## 2013-04-01 HISTORY — DX: Hypoxemia: R09.02

## 2013-04-01 HISTORY — DX: Pneumonia, unspecified organism: J18.9

## 2013-04-01 HISTORY — DX: Malignant neoplasm of unspecified part of unspecified bronchus or lung: C34.90

## 2013-04-01 HISTORY — DX: Atherosclerotic heart disease of native coronary artery without angina pectoris: I25.10

## 2013-04-01 HISTORY — DX: Depression, unspecified: F32.A

## 2013-04-01 HISTORY — DX: Other psychoactive substance abuse, uncomplicated: F19.10

## 2013-04-01 HISTORY — DX: Nonspecific reaction to tuberculin skin test without active tuberculosis: R76.11

## 2013-04-01 HISTORY — DX: Respiratory tuberculosis unspecified: A15.9

## 2013-04-01 HISTORY — DX: Cerebral infarction, unspecified: I63.9

## 2013-04-01 HISTORY — DX: Gastrointestinal hemorrhage, unspecified: K92.2

## 2013-04-01 HISTORY — DX: Major depressive disorder, single episode, unspecified: F32.9

## 2013-04-01 LAB — CBC WITH DIFFERENTIAL/PLATELET
Basophils Relative: 1 % (ref 0–1)
Basophils Relative: 1 % (ref 0–1)
Eosinophils Absolute: 0.5 10*3/uL (ref 0.0–0.7)
Eosinophils Absolute: 0.4 10*3/uL (ref 0.0–0.7)
Eosinophils Relative: 6 % — ABNORMAL HIGH (ref 0–5)
HCT: 32.9 % — ABNORMAL LOW (ref 39.0–52.0)
Hemoglobin: 9.3 g/dL — ABNORMAL LOW (ref 13.0–17.0)
Hemoglobin: 10.3 g/dL — ABNORMAL LOW (ref 13.0–17.0)
Lymphs Abs: 2.5 10*3/uL (ref 0.7–4.0)
Lymphs Abs: 1.9 10*3/uL (ref 0.7–4.0)
MCH: 24.6 pg — ABNORMAL LOW (ref 26.0–34.0)
MCH: 24.4 pg — ABNORMAL LOW (ref 26.0–34.0)
MCHC: 31.5 g/dL (ref 30.0–36.0)
MCHC: 31.3 g/dL (ref 30.0–36.0)
MCV: 78 fL (ref 78.0–100.0)
Monocytes Absolute: 0.7 10*3/uL (ref 0.1–1.0)
Monocytes Absolute: 0.5 10*3/uL (ref 0.1–1.0)
Monocytes Relative: 9 % (ref 3–12)
Monocytes Relative: 8 % (ref 3–12)
Neutro Abs: 3.7 10*3/uL (ref 1.7–7.7)
Neutrophils Relative %: 50 % (ref 43–77)
Neutrophils Relative %: 57 % (ref 43–77)
RBC: 4.22 MIL/uL (ref 4.22–5.81)

## 2013-04-01 LAB — COMPREHENSIVE METABOLIC PANEL
ALT: 11 U/L (ref 0–53)
Albumin: 2.8 g/dL — ABNORMAL LOW (ref 3.5–5.2)
Albumin: 3 g/dL — ABNORMAL LOW (ref 3.5–5.2)
Alkaline Phosphatase: 96 U/L (ref 39–117)
BUN: 16 mg/dL (ref 6–23)
Calcium: 8.6 mg/dL (ref 8.4–10.5)
Calcium: 8.8 mg/dL (ref 8.4–10.5)
Creatinine, Ser: 0.93 mg/dL (ref 0.50–1.35)
GFR calc Af Amer: >90 mL/min (ref >90)
GFR calc Af Amer: >90 mL/min (ref >90)
Glucose, Bld: 121 mg/dL — ABNORMAL HIGH (ref 70–99)
Potassium: 3.1 mEq/L — ABNORMAL LOW (ref 3.5–5.1)
Potassium: 3.2 mEq/L — ABNORMAL LOW (ref 3.5–5.1)
Sodium: 140 mEq/L (ref 135–145)
Total Protein: 7.4 g/dL (ref 6.0–8.3)
Total Protein: 8 g/dL (ref 6.0–8.3)

## 2013-04-01 LAB — IRON AND TIBC
Saturation Ratios: 5 % — ABNORMAL LOW (ref 20–55)
TIBC: 370 ug/dL (ref 215–435)
UIBC: 352 ug/dL (ref 125–400)

## 2013-04-01 LAB — PROCALCITONIN: Procalcitonin: <0.1 ng/mL

## 2013-04-01 LAB — PRO B NATRIURETIC PEPTIDE: Pro B Natriuretic peptide (BNP): 6227 pg/mL — ABNORMAL HIGH (ref 0–125)

## 2013-04-01 LAB — VITAMIN B12: Vitamin B-12: 630 pg/mL (ref 211–911)

## 2013-04-01 LAB — FOLATE: Folate: >20 ng/mL

## 2013-04-01 LAB — RETICULOCYTES: RBC.: 4.22 MIL/uL (ref 4.22–5.81)

## 2013-04-01 LAB — TROPONIN I: Troponin I: <0.3 ng/mL (ref <0.30)

## 2013-04-01 MED ORDER — ZOLPIDEM TARTRATE 5 MG PO TABS
5.0000 mg | ORAL_TABLET | Freq: Once | ORAL | Status: AC
  Administered 2013-04-01: 5 mg via ORAL
  Filled 2013-04-01: qty 1

## 2013-04-01 MED ORDER — LORAZEPAM 2 MG/ML IJ SOLN: 1.0000 mg | Freq: Four times a day (QID) | INTRAMUSCULAR | Status: AC | PRN

## 2013-04-01 MED ORDER — CLOPIDOGREL BISULFATE 75 MG PO TABS
75.0000 mg | ORAL_TABLET | Freq: Every day | ORAL | Status: DC
  Administered 2013-04-01 – 2013-04-05 (×5): 75 mg via ORAL
  Filled 2013-04-01 (×6): qty 1

## 2013-04-01 MED ORDER — LORAZEPAM 0.5 MG PO TABS: 0.0000 mg | ORAL_TABLET | Freq: Two times a day (BID) | ORAL | Status: DC

## 2013-04-01 MED ORDER — DOCUSATE SODIUM 100 MG PO CAPS
100.0000 mg | ORAL_CAPSULE | Freq: Two times a day (BID) | ORAL | Status: DC
  Administered 2013-04-01 – 2013-04-02 (×3): 100 mg via ORAL
  Filled 2013-04-01 (×4): qty 1

## 2013-04-01 MED ORDER — POTASSIUM CHLORIDE CRYS ER 20 MEQ PO TBCR
40.0000 meq | EXTENDED_RELEASE_TABLET | Freq: Once | ORAL | Status: AC
  Administered 2013-04-01: 40 meq via ORAL
  Filled 2013-04-01: qty 2

## 2013-04-01 MED ORDER — SODIUM CHLORIDE 0.9 % IJ SOLN
3.0000 mL | Freq: Two times a day (BID) | INTRAMUSCULAR | Status: DC
  Administered 2013-04-01 – 2013-04-03 (×5): 3 mL via INTRAVENOUS
  Administered 2013-04-04: 10:00:00 via INTRAVENOUS
  Administered 2013-04-04: 3 mL via INTRAVENOUS

## 2013-04-01 MED ORDER — POTASSIUM CHLORIDE CRYS ER 20 MEQ PO TBCR
60.0000 meq | EXTENDED_RELEASE_TABLET | Freq: Once | ORAL | Status: AC
  Administered 2013-04-01: 60 meq via ORAL
  Filled 2013-04-01: qty 3

## 2013-04-01 MED ORDER — LORAZEPAM 0.5 MG PO TABS
1.0000 mg | ORAL_TABLET | Freq: Four times a day (QID) | ORAL | Status: AC | PRN
  Administered 2013-04-03: 1 mg via ORAL
  Filled 2013-04-01: qty 2
  Filled 2013-04-01: qty 1

## 2013-04-01 MED ORDER — FOLIC ACID 1 MG PO TABS
1.0000 mg | ORAL_TABLET | Freq: Every day | ORAL | Status: DC
  Administered 2013-04-01 – 2013-04-05 (×5): 1 mg via ORAL
  Filled 2013-04-01 (×5): qty 1

## 2013-04-01 MED ORDER — THIAMINE HCL 100 MG/ML IJ SOLN
100.0000 mg | Freq: Every day | INTRAMUSCULAR | Status: DC
  Administered 2013-04-05: 100 mg via INTRAVENOUS
  Filled 2013-04-01 (×5): qty 1

## 2013-04-01 MED ORDER — ASPIRIN EC 81 MG PO TBEC
81.0000 mg | DELAYED_RELEASE_TABLET | Freq: Every day | ORAL | Status: DC
  Administered 2013-04-01 – 2013-04-05 (×5): 81 mg via ORAL
  Filled 2013-04-01 (×5): qty 1

## 2013-04-01 MED ORDER — ACETAMINOPHEN 325 MG PO TABS
650.0000 mg | ORAL_TABLET | Freq: Four times a day (QID) | ORAL | Status: DC | PRN
  Administered 2013-04-01 – 2013-04-05 (×2): 650 mg via ORAL
  Filled 2013-04-01 (×2): qty 2

## 2013-04-01 MED ORDER — ONDANSETRON HCL 4 MG/2ML IJ SOLN: 4.0000 mg | Freq: Four times a day (QID) | INTRAMUSCULAR | Status: DC | PRN

## 2013-04-01 MED ORDER — FUROSEMIDE 10 MG/ML IJ SOLN
60.0000 mg | Freq: Three times a day (TID) | INTRAMUSCULAR | Status: DC
  Administered 2013-04-01 – 2013-04-03 (×6): 60 mg via INTRAVENOUS
  Filled 2013-04-01 (×9): qty 6

## 2013-04-01 MED ORDER — POTASSIUM CHLORIDE CRYS ER 20 MEQ PO TBCR: 40.0000 meq | EXTENDED_RELEASE_TABLET | Freq: Two times a day (BID) | ORAL | Status: DC

## 2013-04-01 MED ORDER — CLONIDINE HCL 0.3 MG PO TABS
0.3000 mg | ORAL_TABLET | Freq: Two times a day (BID) | ORAL | Status: DC
  Administered 2013-04-01 – 2013-04-03 (×5): 0.3 mg via ORAL
  Filled 2013-04-01 (×8): qty 1

## 2013-04-01 MED ORDER — LORAZEPAM 0.5 MG PO TABS: 0.0000 mg | ORAL_TABLET | Freq: Four times a day (QID) | ORAL | Status: AC

## 2013-04-01 MED ORDER — ACETAMINOPHEN 650 MG RE SUPP: 650.0000 mg | Freq: Four times a day (QID) | RECTAL | Status: DC | PRN

## 2013-04-01 MED ORDER — METOPROLOL SUCCINATE ER 100 MG PO TB24
200.0000 mg | ORAL_TABLET | Freq: Every day | ORAL | Status: DC
  Administered 2013-04-01 – 2013-04-03 (×3): 200 mg via ORAL
  Filled 2013-04-01 (×3): qty 2

## 2013-04-01 MED ORDER — FUROSEMIDE 10 MG/ML IJ SOLN
40.0000 mg | Freq: Once | INTRAMUSCULAR | Status: AC
  Administered 2013-04-01: 40 mg via INTRAVENOUS
  Filled 2013-04-01: qty 4

## 2013-04-01 MED ORDER — NITROGLYCERIN 2 % TD OINT
1.0000 [in_us] | TOPICAL_OINTMENT | Freq: Four times a day (QID) | TRANSDERMAL | Status: DC
  Administered 2013-04-01 (×2): 1 [in_us] via TOPICAL
  Filled 2013-04-01: qty 30

## 2013-04-01 MED ORDER — ADULT MULTIVITAMIN W/MINERALS CH
1.0000 | ORAL_TABLET | Freq: Every day | ORAL | Status: DC
  Administered 2013-04-01 – 2013-04-05 (×5): 1 via ORAL
  Filled 2013-04-01 (×5): qty 1

## 2013-04-01 MED ORDER — ONDANSETRON HCL 4 MG PO TABS: 4.0000 mg | ORAL_TABLET | Freq: Four times a day (QID) | ORAL | Status: DC | PRN

## 2013-04-01 MED ORDER — LISINOPRIL 5 MG PO TABS
5.0000 mg | ORAL_TABLET | Freq: Every day | ORAL | Status: DC
  Administered 2013-04-01 – 2013-04-03 (×3): 5 mg via ORAL
  Filled 2013-04-01 (×3): qty 1

## 2013-04-01 MED ORDER — SODIUM CHLORIDE 0.9 % IJ SOLN
3.0000 mL | Freq: Two times a day (BID) | INTRAMUSCULAR | Status: DC
  Administered 2013-04-01 – 2013-04-04 (×5): 3 mL via INTRAVENOUS

## 2013-04-01 MED ORDER — VITAMIN B-1 100 MG PO TABS
100.0000 mg | ORAL_TABLET | Freq: Every day | ORAL | Status: DC
  Administered 2013-04-01 – 2013-04-04 (×4): 100 mg via ORAL
  Filled 2013-04-01 (×5): qty 1

## 2013-04-01 MED ORDER — ENOXAPARIN SODIUM 40 MG/0.4ML ~~LOC~~ SOLN
40.0000 mg | SUBCUTANEOUS | Status: DC
  Administered 2013-04-01 – 2013-04-05 (×5): 40 mg via SUBCUTANEOUS
  Filled 2013-04-01 (×5): qty 0.4

## 2013-04-01 MED ORDER — POTASSIUM CHLORIDE CRYS ER 20 MEQ PO TBCR
30.0000 meq | EXTENDED_RELEASE_TABLET | Freq: Two times a day (BID) | ORAL | Status: DC
  Administered 2013-04-01 – 2013-04-05 (×8): 30 meq via ORAL
  Filled 2013-04-01 (×9): qty 1

## 2013-04-01 MED ORDER — HYDRALAZINE HCL 20 MG/ML IJ SOLN
10.0000 mg | INTRAMUSCULAR | Status: DC | PRN
  Administered 2013-04-01: 10 mg via INTRAVENOUS
  Filled 2013-04-01 (×2): qty 1

## 2013-04-01 MED ORDER — FUROSEMIDE 10 MG/ML IJ SOLN
INTRAMUSCULAR | Status: AC
  Filled 2013-04-01: qty 8

## 2013-04-01 MED ORDER — HYDRALAZINE HCL 25 MG PO TABS
25.0000 mg | ORAL_TABLET | Freq: Three times a day (TID) | ORAL | Status: DC
  Administered 2013-04-01 – 2013-04-03 (×8): 25 mg via ORAL
  Filled 2013-04-01 (×11): qty 1

## 2013-04-01 NOTE — Progress Notes (Signed)
*  PRELIMINARY RESULTS* Echocardiogram 2D Echocardiogram has been performed.  Mark [name] M 04/01/2013, 10:31 AM

## 2013-04-01 NOTE — Progress Notes (Signed)
Utilization Review Completed.   Neila Teem, RN, BSN Nurse Case Manager  [phone]  

## 2013-04-01 NOTE — Consult Note (Signed)
CARDIOLOGY CONSULT NOTE  Patient ID: Mark [name], MRN: [id number], DOB/AGE: 01-Feb-1951 62 y.o. Admit date: 04/01/2013   Date of Consult: 04/01/2013 Primary Physician: No PCP Per Patient Primary Cardiologist: New to [facility]  Chief Complaint: SOB Reason for Consult: CHF and new low EF  HPI: Mr. Covington is a 62 y/o M with history of HTN, CVA, prior drug abuse (cocaine/heroin, currently on methadone), remote seizures, EtOH abuse, remote GIB, and lung cancer who recently moved from [place] to [place] to be closer to his daughter. He denies prior history of CHF or CAD but thinks at one point he was told he may have an irregular heartbeat but does not know further details. In 02/2012, he tripped in his apartment and broke his left hip. During that evaluation, CXR revealed a lung lesion later confirmed as adenocarcinoma of lung primary. Per oncology records, the patient underwent left lower lobectomy under the care of Dr.Lapunzina in [place] for a stage I non-small cell lung cancer. He was recently admitted in 02/2013 for pneumonia and hypoxia. CT scan during that admission showed bilateral partially loculated pleural effusions but no clear evidence of metastatic disease. CT of the abdomen showed again the indeterminate lesion in the anterior left hepatic lobe but this was biopsied in the past and was consistent with hepatic cyst. There was also small nodule within the left adrenal gland. He recently underwent PET scan after establishing local care with Dr. Arbutus Ped with nonspecific findings, question hypermetabolism related to infection. He went home to [facility] on home O2.  He presented back to [facility] today with complaints of progressive SOB x 3 days and orthopnea. CXR showed extensive interstitial and airspace disease in the lungs bilaterally ?multifocal PNA, small-mod L and small R pleural effusions, and atherosclerosis. He denies cough or fever. pBNP 6200, Hgb 10.3, troponins neg x 2, glucose elevated. 2D echo shows EF  35-40% with diffuse HK and moderate HK of the basal inferolateral myocardium. He reports eating a lot of salt and microwaved dinners. He drinks 1/2 pint of liquor plus two 2 16-oz beers multiple times per week. He drinks a lot of water. He does describe what sounds like orthostasis in the past but no recent dizziness or syncope. He has been started on IV Lasix but missed this morning's dose due to difficulties with IV access. He has not had any chest pain. He endorses a gradual 20lb weight gain since moving to [place] several months ago. He denies any recent bleeding.  Past Medical History  Diagnosis Date  . Adenocarcinoma, lung     a. Dx 2013, underwent left lower lobectomy under the care of Dr.[place]apunzina in [place] for a stage I non-small cell lung CA.  . Essential hypertension, benign 02/26/2013  . Liver lesion     a. Previously biopsy-proven to be a cyst (per records from a hospital in [place]).  . Hypoxia     a. Adm 02/2013, required home O2.  . Stroke 2009    a. Pt reports several, last in 2009. b. Residual L sided weakness.  . Drug abuse   . Depression   . Seizure disorder   . GI bleed     a. 2009 at time of stroke - received 3 u blood, no source found per pt  . HTN (hypertension)       Most Recent Cardiac Studies: 2D Echo 04/01/13 - Left ventricle: The cavity size was normal. Wall thickness was normal. Systolic function was moderately reduced. The estimated  ejection fraction was in the range of 35% to 40%. Diffuse hypokinesis. There is moderate hypokinesis of the basalinferolateral myocardium. Features are consistent with a pseudonormal left ventricular filling pattern, with concomitant abnormal relaxation and increased filling pressure (grade 2 diastolic dysfunction). - Mitral valve: Mild regurgitation. - Left atrium: The atrium was mildly dilated. - Right atrium: The atrium was mildly dilated. - Pulmonary arteries: PA peak pressure: 35mm Hg (S).   Surgical History:  Past  Surgical History  Procedure Laterality Date  . Hip arthroplasty Left 02/2012  . Lung biopsy Left 07/15/2012    "LLL" (04/01/2013)     Home Meds: Prior to Admission medications   Medication Sig Start Date End Date Taking? Authorizing Provider  aspirin EC 81 MG EC tablet Take 1 tablet (81 mg total) by mouth daily. 03/04/13  Yes Estela Isaiah Blakes, MD  cloNIDine (CATAPRES) 0.3 MG tablet Take 0.3 mg by mouth 2 (two) times daily.   Yes Historical Provider, MD  clopidogrel (PLAVIX) 75 MG tablet Take 75 mg by mouth daily.   Yes Historical Provider, MD  docusate sodium 100 MG CAPS Take 100 mg by mouth 2 (two) times daily. 03/04/13  Yes Estela Isaiah Blakes, MD  hydrALAZINE (APRESOLINE) 25 MG tablet Take 1 tablet (25 mg total) by mouth every 8 (eight) hours. 03/04/13  Yes Estela Isaiah Blakes, MD  magnesium hydroxide (MILK OF MAGNESIA) 400 MG/5ML suspension Take 30 mLs by mouth daily as needed for constipation.   Yes Historical Provider, MD  metoprolol (TOPROL-XL) 200 MG 24 hr tablet Take 200 mg by mouth daily.   Yes Historical Provider, MD  Multiple Vitamin (MULTIVITAMIN WITH MINERALS) TABS Take 1 tablet by mouth daily.   Yes Historical Provider, MD  verapamil (CALAN) 80 MG tablet Take 40 mg by mouth daily.   Yes Historical Provider, MD    Inpatient Medications:  . aspirin EC  81 mg Oral Daily  . cloNIDine  0.3 mg Oral BID  . clopidogrel  75 mg Oral Q breakfast  . docusate sodium  100 mg Oral BID  . enoxaparin (LOVENOX) injection  40 mg Subcutaneous Q24H  . furosemide      . furosemide  60 mg Intravenous Q8H  . hydrALAZINE  25 mg Oral Q8H  . metoprolol  200 mg Oral Daily  . nitroGLYCERIN  1 inch Topical Q6H  . potassium chloride  40 mEq Oral BID  . sodium chloride  3 mL Intravenous Q12H  . sodium chloride  3 mL Intravenous Q12H      Allergies: No Known Allergies  History   Social History  . Marital Status: Widowed    Spouse Name: N/A    Number of Children: N/A  . Years of  Education: N/A   Occupational History  . Not on file.   Social History Main Topics  . Smoking status: Former Smoker    Types: Cigarettes    Quit date: 02/27/2012  . Smokeless tobacco: Never Used  . Alcohol Use: 04/01/2013 "~ 1/2 pint /day of rum plus 2 16oz beers/day"  . Drug Use: Not currently - previously used heroin and cocaine. Is on methadone.  . Sexually Active: No   Other Topics Concern  . Not on file   Social History Narrative  . No narrative on file     Family History  Problem Relation Age of Onset  . Stroke    . CAD Neg Hx      Review of Systems: General: negative for  rigors, fever Cardiovascular: see above Dermatological: negative for rash Respiratory: negative for cough or wheezing Urologic: negative for hematuria Abdominal: negative for nausea, vomiting, diarrhea, bright red blood per rectum, melena, or hematemesis Neurologic: negative for visual changes, syncope, or dizziness All other systems reviewed and are otherwise negative except as noted above.  Labs:  Recent Labs  04/01/13 0205 04/01/13 0820  TROPONINI <0.30 <0.30   Lab Results  Component Value Date   WBC 6.6 04/01/2013   HGB 10.3* 04/01/2013   HCT 32.9* 04/01/2013   MCV 78.0 04/01/2013   PLT 314 04/01/2013     Recent Labs Lab 04/01/13 0819  NA 140  K 3.2*  CL 104  CO2 27  BUN 15  CREATININE 0.92  CALCIUM 8.8  PROT 8.0  BILITOT 0.4  ALKPHOS 96  ALT 11  AST 23  GLUCOSE 181*   Radiology/Studies:  Dg Chest 2 View 04/01/2013   *RADIOLOGY REPORT*  Clinical Data: Shortness of breath.  CHEST - 2 VIEW  Comparison: Chest x-ray 02/26/2013.  Findings: Patchy areas of interstitial prominence and ground-glass attenuation are noted in the lungs bilaterally, most pronounced throughout the left mid lung and the right lower lung.  Diffuse peribronchial cuffing.  Chronic small to moderate left pleural effusion and small right pleural effusion.  No evidence of pulmonary edema.  Heart size is normal. The  patient is rotated to the left on today's exam, resulting in distortion of the mediastinal contours and reduced diagnostic sensitivity and specificity for mediastinal pathology.  Atherosclerotic calcifications are noted within the thoracic aorta.  IMPRESSION: 1.  Overall, aeration has improved compared to the prior chest x- ray although there continues to be extensive interstitial and airspace disease in the lungs bilaterally.  This may represent a resolving multilobar pneumonia. 2.  Small to moderate left and small right-sided pleural effusions. 3.  Atherosclerosis.   Original Report Authenticated By: Trudie Reed, M.D.    Nm Pet Image Initial (pi) Skull Base To Thigh 03/17/2013   *RADIOLOGY REPORT*  Clinical Data: Initial treatment strategy for staging 4 lung cancer.  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  Surgery in 2009.  NUCLEAR MEDICINE PET SKULL BASE TO THIGH  Fasting Blood Glucose:  102  Technique:  14.4 mCi F-18 FDG was injected intravenously. CT data was obtained and used for attenuation correction and anatomic localization only.  (This was not acquired as a diagnostic CT examination.) Additional exam technical data entered on technologist worksheet.  Comparison:  Chest, abdomen, and pelvic CTs of 02/26/2013.  The office note of 03/09/2013.  Findings:  Neck: No abnormal hypermetabolism.  Chest:  Moderate hypermetabolism which corresponds to patchy left upper lobe ground-glass and interstitial/airspace opacities.  This measures a S.U.V. max of 7.4, including on image 98/series 2.  More low level hypermetabolism is identified within the inferior aspect of the remaining left lung.  When compared to 02/26/2013, this pulmonary process has become more confluent and progressed in the superior aspect of the remaining left lung but  partially regressed in the more inferior aspect of the left lung.  Similarly, low level nonspecific hypermetabolism is identified throughout the  right lung.  This corresponds to areas of right lower lobe interstitial and airspace opacity.  The right lower lobe process is decreased since the 02/26/2013 CT.  Nonspecific low level level hypermetabolism is identified within the remaining left sided pleural fluid thickening.  This measures a S.U.V. max of 3.1.  A right paratracheal node measures 8 mm  and a S.U.V. max of 2.8 on image 87.  This is similar to the surrounding mediastinal pool and therefore indeterminate.  Abdomen/Pelvis:  No hypermetabolism to correspond to the left liver lobe hypoattenuating lesion, which is identified on image 141 2.0 cm.  The medial left adrenal nodule measures 9 mm and demonstrates low level hypermetabolism, similar to the hepatic uptake.  This measures a S.U.V. max of 2.9.  Nodule which is positioned just inferior to the proximal sigmoid colon, but is favored to represent a left external iliac node, measures 7 mm anda S.U.V. max of 5.0 on image 216. A left external iliac node measures 1.5 cm and a S.U.V. max of 4.0 on image 224.  Skeleton:  Low level hypermetabolism surrounding the proximal left femur, at the site of fixation of prior subcapital femoral fracture.  CT  images performed for attenuation correction demonstrate no significant findings within the neck.  Chest, abdomen, and pelvic findings deferred to recent diagnostic CTs.  The small right pleural effusion is similar. Mild pleural thickening remains. Decreased to resolved left-sided pleural fluid.  Left renal cyst.  Cholelithiasis.  Mild to moderate osteopenia.  IMPRESSION:  1.  Hypermetabolic left pulmonary process, status post left lower lobectomy.  Given the shifting CT appearance since 02/26/2013, favored to represent an infectious process, including atypical etiologies.  Given that the primary was adenocarcinoma ( which could have this appearance), recurrent or metachronous tumor cannot be entirely excluded but is felt less likely. 2.  Similarly, partially  improved right lung process with mild hypermetabolism, favored to represent infection. 3.  The left hepatic lobe lesion described on prior CT is not hypermetabolic. 4.  Indeterminate low level hypermetabolism corresponding to minimal left adrenal nodularity.  This could be reevaluated follow- up. 5.  Mild hypermetabolic left pelvic sidewall adenopathy.  Not in the typical drainage pattern for metastatic primary bronchogenic carcinoma.  Given the prior trauma and surgery at the proximal left femur, reactive adenopathy is favored.  This could also be reevaluated on follow-up.   Original Report Authenticated By: Jeronimo Greaves, M.D.   EKG: sinus bradycardia 56bpm Nonspecific ST-T changes, QTc 496  Physical Exam: Blood pressure 120/45, pulse 60, temperature 97.4 F (36.3 C), temperature source Oral, resp. rate 18, height 5' 11.5" (1.816 m), weight 149 lb 14.6 oz (68 kg), SpO2 100.00%. General: Well developed AAM in no acute distress. Head: Normocephalic, atraumatic, sclera non-icteric, no xanthomas, nares are without discharge.  Neck: Negative for carotid bruits. JVD not elevated. Lungs: Absent BS L lung base and diminished until 1/2 way up, coarse rales at R base, no wheezes or rhonchi. Breathing is unlabored. Heart: RRR with S1 S2. No murmurs, rubs, or gallops appreciated. Abdomen: Soft, non-tender, non-distended with normoactive bowel sounds. No hepatomegaly. No rebound/guarding. No obvious abdominal masses. Msk:  Strength and tone appear normal for age. Extremities: No clubbing or cyanosis. No edema.  Distal pedal pulses are 2+ and equal bilaterally. Neuro: Alert and oriented X 3. No facial asymmetry. No focal deficit. Moves all extremities spontaneously. Psych:  Responds to questions appropriately with a normal affect.   Assessment and Plan:   1. Acute systolic CHF with newly diagnosed cardiomyopathy EF 35-40% 2. Recent PNA/hypoxia 3. Lung CA dx 2013, s/p L lobectomy 4. Multiple CVAs 5. Remote  drug abuse with cocaine & heroin, currently on methadone 6. EtOH abuse  7. Hypertension 8. Hyperglycemia 9. Anemia felt previously to be related to chronic disease 10. Hypokalemia 11. Prolonged QT  Recommendations: Chronicity of low  EF is unclear and at this point we don't know if it is ischemic vs nonischemic. With segmental wall motion abnormality and newly decreased EF, we feel he requires cardiac catheterization. He is on the schedule for [facility] for Friday, but will need orders tomorrow if he appears stable to proceed. If NICM, suspect related to EtOH. Start CIWA protocol. Check A1C given ?glu and fasting lipid panel. He is already on ASA/Plavix for prior history of stroke.  Add lisinopril 5mg  and follow Cr. DC NTG paste (not having chest pain, will need to watch BP with Lasix.) He was previously on verapamil at home - avoid this given low EF. Continue diuresis as ordered (just started) and reassess in AM. Add scheduled KCl. With regard to QTc, follow with correction of hypokalemia.  Anemia panel is pending; will also send for hemoccult.   Signed, Ronie Spies PA-C 04/01/2013, 3:32 PM   Patient examined chart reviewed.  Significant presentation with CHF and EF quite low. ECG and clinical history suggest non ischemic DCM but degree of LV dysfunction warrants right and left heart cath.  May be related to ongiong ETOH abuse and previous drug abuse.  Needs another day of diuresis but should be ok for cath Friday. He is willing to proceed. Start ACE  [employer]

## 2013-04-01 NOTE — ED Notes (Signed)
The pt has had sob especially when he attempts to sleep for the past 2 days.  He is alos c/o swollen feet and ankles.  He reports that he wakes up has a quivering in his abd and he gets sob then he cannot go back to sleep

## 2013-04-01 NOTE — ED Provider Notes (Signed)
History     CSN: [id number]  Arrival date & time 04/01/13  0153   First MD Initiated Contact with Patient 04/01/13 [phone]      Chief Complaint  Patient presents with  . Shortness of Breath    (Consider location/radiation/quality/duration/timing/severity/associated sxs/prior treatment) The history is provided by the patient.  Mark [name] is a 62 y.o. male history of hypertension, possible lung cancer here presenting with leg swelling and shortness of breath. He was hospitalized month ago for pneumonia. For the last to 3 days he has worse short of breath when he sleeps. Tonight he woke up in the middle of night short of breath and is unable to get back to bed. No cough or fever. He also noted that his legs were swollen. No hx of CAD or CHF. No hx of PE/DVT. He is on 2 L home O2.     Past Medical History  Diagnosis Date  . Cancer     lung  . Essential hypertension, benign 02/26/2013    Past Surgical History  Procedure Laterality Date  . Left hip surgery    . Lung surgery-lobectomy      No family history on file.  History  Substance Use Topics  . Smoking status: Former Smoker    Types: Cigarettes    Quit date: 02/27/2012  . Smokeless tobacco: Never Used  . Alcohol Use: No      Review of Systems  Respiratory: Positive for shortness of breath.   Cardiovascular: Positive for leg swelling.  All other systems reviewed and are negative.    Allergies  Review of patient's allergies indicates no known allergies.  Home Medications   Current Outpatient Rx  Name  Route  Sig  Dispense  Refill  . aspirin EC 81 MG EC tablet   Oral   Take 1 tablet (81 mg total) by mouth daily.         . cloNIDine (CATAPRES) 0.3 MG tablet   Oral   Take 0.3 mg by mouth 2 (two) times daily.         . clopidogrel (PLAVIX) 75 MG tablet   Oral   Take 75 mg by mouth daily.         Marland Kitchen docusate sodium 100 MG CAPS   Oral   Take 100 mg by mouth 2 (two) times daily.   10 capsule      .  hydrALAZINE (APRESOLINE) 25 MG tablet   Oral   Take 1 tablet (25 mg total) by mouth every 8 (eight) hours.         . magnesium hydroxide (MILK OF MAGNESIA) 400 MG/5ML suspension   Oral   Take 30 mLs by mouth daily as needed for constipation.         . metoprolol (TOPROL-XL) 200 MG 24 hr tablet   Oral   Take 200 mg by mouth daily.         . Multiple Vitamin (MULTIVITAMIN WITH MINERALS) TABS   Oral   Take 1 tablet by mouth daily.         . verapamil (CALAN) 80 MG tablet   Oral   Take 40 mg by mouth daily.           BP 195/108  Pulse 65  Temp(Src) 98.6 F (37 C) (Oral)  Resp 15  Ht 5' 11.5" (1.816 m)  Wt 150 lb (68.04 kg)  BMI 20.63 kg/m2  SpO2 100%  Physical Exam  Nursing note and vitals reviewed. Constitutional: He  is oriented to person, place, and time.  Chronically ill, uncomfortable   HENT:  Head: Normocephalic.  Mouth/Throat: Oropharynx is clear and moist.  Eyes: Conjunctivae are normal. Pupils are equal, round, and reactive to light.  Neck: Normal range of motion. Neck supple.  Cardiovascular: Normal rate, regular rhythm and normal heart sounds.   Pulmonary/Chest: Effort normal.  + crackles R lung base   Musculoskeletal: Normal range of motion.  1+ edema bilaterally   Neurological: He is alert and oriented to person, place, and time.  Skin: Skin is warm and dry.  Psychiatric: He has a normal mood and affect. His behavior is normal. Judgment and thought content normal.    ED Course  Procedures (including critical care time)  Labs Reviewed  CBC WITH DIFFERENTIAL - Abnormal; Notable for the following:    RBC 3.78 (*)    Hemoglobin 9.3 (*)    HCT 29.5 (*)    MCH 24.6 (*)    RDW 16.7 (*)    Eosinophils Relative 6 (*)    All other components within normal limits  COMPREHENSIVE METABOLIC PANEL - Abnormal; Notable for the following:    Potassium 3.1 (*)    Glucose, Bld 121 (*)    Albumin 2.8 (*)    GFR calc non Af Amer 88 (*)    All other  components within normal limits  PRO B NATRIURETIC PEPTIDE - Abnormal; Notable for the following:    Pro B Natriuretic peptide (BNP) 6227.0 (*)    All other components within normal limits  TROPONIN I  PROCALCITONIN   Dg Chest 2 View  04/01/2013   *RADIOLOGY REPORT*  Clinical Data: Shortness of breath.  CHEST - 2 VIEW  Comparison: Chest x-ray 02/26/2013.  Findings: Patchy areas of interstitial prominence and ground-glass attenuation are noted in the lungs bilaterally, most pronounced throughout the left mid lung and the right lower lung.  Diffuse peribronchial cuffing.  Chronic small to moderate left pleural effusion and small right pleural effusion.  No evidence of pulmonary edema.  Heart size is normal. The patient is rotated to the left on today's exam, resulting in distortion of the mediastinal contours and reduced diagnostic sensitivity and specificity for mediastinal pathology.  Atherosclerotic calcifications are noted within the thoracic aorta.  IMPRESSION: 1.  Overall, aeration has improved compared to the prior chest x- ray although there continues to be extensive interstitial and airspace disease in the lungs bilaterally.  This may represent a resolving multilobar pneumonia. 2.  Small to moderate left and small right-sided pleural effusions. 3.  Atherosclerosis.   Original Report Authenticated By: Trudie Reed, M.D.     No diagnosis found.   Date: 04/01/2013  Rate: 56  Rhythm: sinus bradycardia  QRS Axis: normal  Intervals: normal  ST/T Wave abnormalities: nonspecific ST changes  Conduction Disutrbances:none  Narrative Interpretation: + LVH  Old EKG Reviewed: unchanged    MDM  Mark [name] is a 62 y.o. male here with SOB, leg swelling. Will need to r/o CHF vs pneumonia. Less likely to be ACS and symptoms for 2 days so trop x 1 sufficient. Will get BNP and CXR and reassess.   4:28 AM CXR showed improving pneumonia but R pleural effusion. Trop neg. BNP 6000 with no baseline. I  think he is developing new onset heart failure. I gave him lasix and will admit to tele. I discussed with Dr. Toniann Fail.        Richardean Canal, MD 04/01/13 [phone]

## 2013-04-01 NOTE — Progress Notes (Signed)
*  PRELIMINARY RESULTS* Vascular Ultrasound Lower extremity venous duplex has been completed.  Preliminary findings: bilaterally negative for DVT. Small baker's cyst noted on right.  Farrel Demark, RDMS, RVT  04/01/2013, 9:26 AM

## 2013-04-01 NOTE — Progress Notes (Signed)
Patient seen and examined. Admitted earlier today for SOB. Believed to have CHF given CXR findings and BNP of >6000. ECHO confirms combined CHF with EF of 35% and wall motion abnormalities. Cards consult requested. Will continue to follow. He is >2L negative since admission.  Peggye Pitt, MD [facility] Pager: [phone]

## 2013-04-01 NOTE — ED Notes (Signed)
EDP to start an ultrasound guided IV for the patient.

## 2013-04-01 NOTE — H&P (Addendum)
[facility] History and Physical  Mark [name] ZOX:[id number] DOB: 25-Apr-1951 DOA: 04/01/2013  Referring physician: ER physician. PCP: No PCP Per Patient  Specialists: None.  Chief Complaint: Shortness of breath.  HPI: Mark [name] is a 62 y.o. male with known history of hypertension who was admitted last month for pneumonia presented with progressive shortness of breath over the last 3 days. Patient states he has been getting short of breath on lying flat. Check a productive cough fever chills chest pain. He has noticed increased swelling of the lower extremities. In the ER chest x-ray shows bilateral congestion and pleural effusion and increased BNP. Patient has been admitted for CHF. Patient was given one dose of IV Lasix 40 mg with nitroglycerin paste. Patient also was found to have elevated blood pressure. Patient usually gets his care from [place] has recently moved to [place] 2 months ago.  Review of Systems: As presented in the history of presenting illness, rest negative.  Past Medical History  Diagnosis Date  . Cancer     lung  . Essential hypertension, benign 02/26/2013   Past Surgical History  Procedure Laterality Date  . Left hip surgery    . Lung surgery-lobectomy     Social History:  reports that he quit smoking about 13 months ago. His smoking use included Cigarettes. He smoked 0.00 packs per day. He has never used smokeless tobacco. He reports that he does not drink alcohol or use illicit drugs. Lives at home. where does patient live-- Can do ADLs. Can patient participate in ADLs?  No Known Allergies  History reviewed. No pertinent family history.    Prior to Admission medications   Medication Sig Start Date End Date Taking? Authorizing Provider  aspirin EC 81 MG EC tablet Take 1 tablet (81 mg total) by mouth daily. 03/04/13  Yes Estela Isaiah Blakes, MD  cloNIDine (CATAPRES) 0.3 MG tablet Take 0.3 mg by mouth 2 (two) times daily.   Yes Historical  Provider, MD  clopidogrel (PLAVIX) 75 MG tablet Take 75 mg by mouth daily.   Yes Historical Provider, MD  docusate sodium 100 MG CAPS Take 100 mg by mouth 2 (two) times daily. 03/04/13  Yes Estela Isaiah Blakes, MD  hydrALAZINE (APRESOLINE) 25 MG tablet Take 1 tablet (25 mg total) by mouth every 8 (eight) hours. 03/04/13  Yes Estela Isaiah Blakes, MD  magnesium hydroxide (MILK OF MAGNESIA) 400 MG/5ML suspension Take 30 mLs by mouth daily as needed for constipation.   Yes Historical Provider, MD  metoprolol (TOPROL-XL) 200 MG 24 hr tablet Take 200 mg by mouth daily.   Yes Historical Provider, MD  Multiple Vitamin (MULTIVITAMIN WITH MINERALS) TABS Take 1 tablet by mouth daily.   Yes Historical Provider, MD  verapamil (CALAN) 80 MG tablet Take 40 mg by mouth daily.   Yes Historical Provider, MD   Physical Exam: Filed Vitals:   04/01/13 0207 04/01/13 0232 04/01/13 0515 04/01/13 0551  BP: 177/86 195/108 187/107 192/107  Pulse:  65 78 67  Temp: 98.1 F (36.7 C) 98.6 F (37 C)  97.4 F (36.3 C)  TempSrc: Oral Oral  Oral  Resp: 16 15 16 18   Height:  5' 11.5" (1.816 m)    Weight:  68.04 kg (150 lb)  68 kg (149 lb 14.6 oz)  SpO2: 95% 100% 100% 100%     General:  Well-developed and nourished.  Eyes: Anicteric no pallor.  ENT: No discharge from the ears eyes nose mouth.  Neck:  No mass felt.  Cardiovascular: S1-S2 heard.  Respiratory: No rhonchi or crepitations.  Abdomen: Soft nontender bowel sounds present.  Skin: No rash.  Musculoskeletal: Mild edema of the bilateral lower extremities.  Psychiatric: Appears normal.  Neurologic: Alert awake oriented to time place and person. Moves all extremities.  Labs on Admission:  Basic Metabolic Panel:  Recent Labs Lab 04/01/13 0205  NA 142  K 3.1*  CL 107  CO2 26  GLUCOSE 121*  BUN 16  CREATININE 0.93  CALCIUM 8.6   Liver Function Tests:  Recent Labs Lab 04/01/13 0205  AST 22  ALT 10  ALKPHOS 88  BILITOT 0.3   PROT 7.4  ALBUMIN 2.8*   No results found for this basename: LIPASE, AMYLASE,  in the last 168 hours No results found for this basename: AMMONIA,  in the last 168 hours CBC:  Recent Labs Lab 04/01/13 0205  WBC 7.4  NEUTROABS 3.7  HGB 9.3*  HCT 29.5*  MCV 78.0  PLT 310   Cardiac Enzymes:  Recent Labs Lab 04/01/13 0205  TROPONINI <0.30    BNP (last 3 results)  Recent Labs  04/01/13 0235  PROBNP 6227.0*   CBG: No results found for this basename: GLUCAP,  in the last 168 hours  Radiological Exams on Admission: Dg Chest 2 View  04/01/2013   *RADIOLOGY REPORT*  Clinical Data: Shortness of breath.  CHEST - 2 VIEW  Comparison: Chest x-ray 02/26/2013.  Findings: Patchy areas of interstitial prominence and ground-glass attenuation are noted in the lungs bilaterally, most pronounced throughout the left mid lung and the right lower lung.  Diffuse peribronchial cuffing.  Chronic small to moderate left pleural effusion and small right pleural effusion.  No evidence of pulmonary edema.  Heart size is normal. The patient is rotated to the left on today's exam, resulting in distortion of the mediastinal contours and reduced diagnostic sensitivity and specificity for mediastinal pathology.  Atherosclerotic calcifications are noted within the thoracic aorta.  IMPRESSION: 1.  Overall, aeration has improved compared to the prior chest x- ray although there continues to be extensive interstitial and airspace disease in the lungs bilaterally.  This may represent a resolving multilobar pneumonia. 2.  Small to moderate left and small right-sided pleural effusions. 3.  Atherosclerosis.   Original Report Authenticated By: Trudie Reed, M.D.    EKG: Independently reviewed. Normal sinus rhythm with sinus bradycardia. Long QT interval.  Assessment/Plan Principal Problem:   CHF (congestive heart failure) Active Problems:   Essential hypertension, benign   Anemia of chronic disease   1. CHF -  patient's symptoms are consistent with CHF. There was a question of pneumonia but prolactin levels are undetectable. I have ordered Lasix 60 mg IV Q8. Check 2-D echo. Patient has lower extremity edema probably from CHF but we will also check Dopplers to rule out DVT. 2. Uncontrolled hypertension - patient has been placed on nitroglycerin patch and continue home medications except verapamil due to bradycardia. I think patient's blood pressure may improve with diuresis. 3. Sinus bradycardia with long QT - patient does have long QT in previous EKG. Due to the sinus bradycardia I'm holding off verapamil. Replace potassium and check magnesium. Check TSH. 4. Anemia of chronic disease - check anemia panel. Follow CBC. 5. History of lung cancer status post lobectomy.    Code Status: Full code.  Family Communication: None.  Disposition Plan: Admit to inpatient.    Lasundra Hascall N. [facility] Pager [phone].  If 7PM-7AM, please contact night-coverage  [email or web address] Password [email or web address] 04/01/2013, 6:09 AM

## 2013-04-02 DIAGNOSIS — F1010 Alcohol abuse, uncomplicated: Secondary | ICD-10-CM

## 2013-04-02 LAB — BASIC METABOLIC PANEL
BUN: 20 mg/dL (ref 6–23)
CO2: 28 mEq/L (ref 19–32)
Calcium: 8.9 mg/dL (ref 8.4–10.5)
Chloride: 101 mEq/L (ref 96–112)
Creatinine, Ser: 1.19 mg/dL (ref 0.50–1.35)
GFR calc Af Amer: 74 mL/min — ABNORMAL LOW (ref >90)
GFR calc non Af Amer: 64 mL/min — ABNORMAL LOW (ref >90)
Glucose, Bld: 223 mg/dL — ABNORMAL HIGH (ref 70–99)
Potassium: 3.7 mEq/L (ref 3.5–5.1)
Sodium: 139 mEq/L (ref 135–145)

## 2013-04-02 LAB — LIPID PANEL
LDL Cholesterol: 51 mg/dL (ref 0–99)
Total CHOL/HDL Ratio: 2.3 RATIO
VLDL: 28 mg/dL (ref 0–40)

## 2013-04-02 LAB — CBC
HCT: 32.3 % — ABNORMAL LOW (ref 39.0–52.0)
Hemoglobin: 10 g/dL — ABNORMAL LOW (ref 13.0–17.0)
MCH: 24 pg — ABNORMAL LOW (ref 26.0–34.0)
MCHC: 31 g/dL (ref 30.0–36.0)
MCV: 77.5 fL — ABNORMAL LOW (ref 78.0–100.0)
Platelets: 335 10*3/uL (ref 150–400)
RBC: 4.17 MIL/uL — ABNORMAL LOW (ref 4.22–5.81)
RDW: 16.9 % — ABNORMAL HIGH (ref 11.5–15.5)
WBC: 5.9 10*3/uL (ref 4.0–10.5)

## 2013-04-02 LAB — HEMOGLOBIN A1C
Hgb A1c MFr Bld: 5.5 % (ref <5.7)
Mean Plasma Glucose: 111 mg/dL (ref <117)

## 2013-04-02 MED ORDER — ZOLPIDEM TARTRATE 5 MG PO TABS
5.0000 mg | ORAL_TABLET | Freq: Once | ORAL | Status: AC
  Administered 2013-04-02: 5 mg via ORAL
  Filled 2013-04-02: qty 1

## 2013-04-02 MED ORDER — DOCUSATE SODIUM 100 MG PO CAPS
200.0000 mg | ORAL_CAPSULE | Freq: Two times a day (BID) | ORAL | Status: DC
  Administered 2013-04-02 – 2013-04-03 (×3): 200 mg via ORAL
  Administered 2013-04-04: 100 mg via ORAL
  Administered 2013-04-04 – 2013-04-05 (×2): 200 mg via ORAL
  Filled 2013-04-02 (×7): qty 2

## 2013-04-02 NOTE — Progress Notes (Signed)
Patient ID: Mark [name], male   DOB: 12-07-1950, 62 y.o.   MRN: [id number] Diuresing well. Feels less SOB. Cath tomorrow.Will make NPO.

## 2013-04-02 NOTE — Progress Notes (Signed)
[facility] PROGRESS NOTE  Mark [name] JWJ:[id number] DOB: June 05, 1951 DOA: 04/01/2013 PCP: No PCP Per Patient  Assessment/Plan: Acute Combined CHF -ECHO with EF of 35-40%. -Cards consulted and cath will be done in am to r/o ischemic cardiomyopathy. -ETOH cardiomyopathy is another possibility. -Continue current lasix dose. -He is negative 3.3 L since admission. -Continue ASA/ACE-I/BB. -Watch renal function with newly started ACE-I.  ETOH Abuse -No sign of withdrawals. -Thiamine/folate.  HTN -Fair control. -Continue to monitor and adjust meds as needed.  Hypokalemia -2/2 diuresis. -Replete PO. -Mg ok at 1.9.  Prolonged QT -Follow with correction of hypokalemia. -Avoid causative meds.  Code Status: Full code Family Communication: Patient only  Disposition Plan: To be determined   Consultants:  LB cardiology   Antibiotics:  None   Subjective: No complaints. Feels much better.  Objective: Filed Vitals:   04/01/13 1805 04/01/13 2128 04/02/13 0432 04/02/13 0624  BP: 130/61 139/72 136/59 175/96  Pulse: 61 68 74   Temp:  97.6 F (36.4 C) 97.8 F (36.6 C)   TempSrc:  Oral Oral   Resp:  18 18   Height:      Weight:   67.4 kg (148 lb 9.4 oz)   SpO2:  99% 98%     Intake/Output Summary (Last 24 hours) at 04/02/13 0840 Last data filed at 04/02/13 0825  Gross per 24 hour  Intake   1323 ml  Output   3250 ml  Net  -1927 ml   Filed Weights   04/01/13 0232 04/01/13 0551 04/02/13 0432  Weight: 68.04 kg (150 lb) 68 kg (149 lb 14.6 oz) 67.4 kg (148 lb 9.4 oz)    Exam:   General:  AA Ox3, NAD  Cardiovascular: RRR, no M/R/G  Respiratory: Mild bibasilar crackles  Abdomen: S/NT/ND/+BS/no masses or organomegaly noted  Extremities: 1+ edema bilaterally   Neurologic:  Grossly intact and non-focal  Data Reviewed: Basic Metabolic Panel:  Recent Labs Lab 04/01/13 0205 04/01/13 0819  NA 142 140  K 3.1* 3.2*  CL 107 104  CO2 26 27  GLUCOSE 121*  181*  BUN 16 15  CREATININE 0.93 0.92  CALCIUM 8.6 8.8  MG  --  1.9   Liver Function Tests:  Recent Labs Lab 04/01/13 0205 04/01/13 0819  AST 22 23  ALT 10 11  ALKPHOS 88 96  BILITOT 0.3 0.4  PROT 7.4 8.0  ALBUMIN 2.8* 3.0*   No results found for this basename: LIPASE, AMYLASE,  in the last 168 hours No results found for this basename: AMMONIA,  in the last 168 hours CBC:  Recent Labs Lab 04/01/13 0205 04/01/13 0819  WBC 7.4 6.6  NEUTROABS 3.7 3.7  HGB 9.3* 10.3*  HCT 29.5* 32.9*  MCV 78.0 78.0  PLT 310 314   Cardiac Enzymes:  Recent Labs Lab 04/01/13 0205 04/01/13 0820  TROPONINI <0.30 <0.30   BNP (last 3 results)  Recent Labs  04/01/13 0235  PROBNP 6227.0*   CBG: No results found for this basename: GLUCAP,  in the last 168 hours  No results found for this or any previous visit (from the past 240 hour(s)).   Studies: Dg Chest 2 View  04/01/2013   *RADIOLOGY REPORT*  Clinical Data: Shortness of breath.  CHEST - 2 VIEW  Comparison: Chest x-ray 02/26/2013.  Findings: Patchy areas of interstitial prominence and ground-glass attenuation are noted in the lungs bilaterally, most pronounced throughout the left mid lung and the right lower lung.  Diffuse peribronchial cuffing.  Chronic  small to moderate left pleural effusion and small right pleural effusion.  No evidence of pulmonary edema.  Heart size is normal. The patient is rotated to the left on today's exam, resulting in distortion of the mediastinal contours and reduced diagnostic sensitivity and specificity for mediastinal pathology.  Atherosclerotic calcifications are noted within the thoracic aorta.  IMPRESSION: 1.  Overall, aeration has improved compared to the prior chest x- ray although there continues to be extensive interstitial and airspace disease in the lungs bilaterally.  This may represent a resolving multilobar pneumonia. 2.  Small to moderate left and small right-sided pleural effusions. 3.   Atherosclerosis.   Original Report Authenticated By: Trudie Reed, M.D.    Scheduled Meds: . aspirin EC  81 mg Oral Daily  . cloNIDine  0.3 mg Oral BID  . clopidogrel  75 mg Oral Q breakfast  . docusate sodium  100 mg Oral BID  . enoxaparin (LOVENOX) injection  40 mg Subcutaneous Q24H  . folic acid  1 mg Oral Daily  . furosemide  60 mg Intravenous Q8H  . hydrALAZINE  25 mg Oral Q8H  . lisinopril  5 mg Oral Daily  . LORazepam  0-4 mg Oral Q6H   Followed by  . [START ON 04/03/2013] LORazepam  0-4 mg Oral Q12H  . metoprolol  200 mg Oral Daily  . multivitamin with minerals  1 tablet Oral Daily  . potassium chloride  30 mEq Oral BID  . sodium chloride  3 mL Intravenous Q12H  . sodium chloride  3 mL Intravenous Q12H  . thiamine  100 mg Oral Daily   Or  . thiamine  100 mg Intravenous Daily   Continuous Infusions:   Principal Problem:   Acute combined systolic and diastolic CHF, NYHA class 1 Active Problems:   Essential hypertension, benign   Anemia of chronic disease   ETOH abuse    Time spent: 35 minutes.    Chaya Jan  [facility] Pager [phone]  If 7PM-7AM, please contact night-coverage at [email or web address], password [facility] 04/02/2013, 8:40 AM  LOS: 1 day

## 2013-04-03 ENCOUNTER — Encounter (HOSPITAL_COMMUNITY)
Admission: EM | Disposition: A | Discharge status: Home / Self Care | Payer: Self-pay | Source: Home / Self Care | Attending: Internal Medicine

## 2013-04-03 DIAGNOSIS — I6789 Other cerebrovascular disease: Secondary | ICD-10-CM

## 2013-04-03 DIAGNOSIS — I2510 Atherosclerotic heart disease of native coronary artery without angina pectoris: Secondary | ICD-10-CM

## 2013-04-03 DIAGNOSIS — I10 Essential (primary) hypertension: Secondary | ICD-10-CM

## 2013-04-03 HISTORY — PX: LEFT AND RIGHT HEART CATHETERIZATION WITH CORONARY ANGIOGRAM: SHX5449

## 2013-04-03 LAB — POCT I-STAT 3, VENOUS BLOOD GAS (G3P V)
Bicarbonate: 31.8 mEq/L — ABNORMAL HIGH (ref 20.0–24.0)
O2 Saturation: 64 %
TCO2: 33 mmol/L (ref 0–100)
pCO2, Ven: 51.7 mmHg — ABNORMAL HIGH (ref 45.0–50.0)
pCO2, Ven: 52 mmHg — ABNORMAL HIGH (ref 45.0–50.0)
pH, Ven: 7.394 — ABNORMAL HIGH (ref 7.250–7.300)
pO2, Ven: 36 mmHg (ref 30.0–45.0)

## 2013-04-03 LAB — BASIC METABOLIC PANEL
GFR calc Af Amer: 68 mL/min — ABNORMAL LOW (ref >90)
GFR calc non Af Amer: 59 mL/min — ABNORMAL LOW (ref >90)
Potassium: 3.9 mEq/L (ref 3.5–5.1)
Sodium: 140 mEq/L (ref 135–145)

## 2013-04-03 LAB — POCT I-STAT 3, ART BLOOD GAS (G3+)
Bicarbonate: 33.2 mEq/L — ABNORMAL HIGH (ref 20.0–24.0)
O2 Saturation: 99 %
TCO2: 35 mmol/L (ref 0–100)
pCO2 arterial: 47.1 mmHg — ABNORMAL HIGH (ref 35.0–45.0)
pH, Arterial: 7.438 (ref 7.350–7.450)
pO2, Arterial: 149 mmHg — ABNORMAL HIGH (ref 80.0–100.0)

## 2013-04-03 LAB — PROTIME-INR
INR: 1.06 (ref 0.00–1.49)
Prothrombin Time: 13.7 seconds (ref 11.6–15.2)

## 2013-04-03 LAB — CBC
Hemoglobin: 10.9 g/dL — ABNORMAL LOW (ref 13.0–17.0)
MCHC: 31.4 g/dL (ref 30.0–36.0)
RBC: 4.5 MIL/uL (ref 4.22–5.81)
WBC: 7.8 10*3/uL (ref 4.0–10.5)

## 2013-04-03 SURGERY — LEFT AND RIGHT HEART CATHETERIZATION WITH CORONARY ANGIOGRAM
Anesthesia: LOCAL

## 2013-04-03 MED ORDER — LIDOCAINE HCL (PF) 1 % IJ SOLN
INTRAMUSCULAR | Status: AC
  Filled 2013-04-03: qty 30

## 2013-04-03 MED ORDER — ONDANSETRON HCL 4 MG/2ML IJ SOLN: 4.0000 mg | Freq: Four times a day (QID) | INTRAMUSCULAR | Status: DC | PRN

## 2013-04-03 MED ORDER — HYDRALAZINE HCL 20 MG/ML IJ SOLN: 10.0000 mg | INTRAMUSCULAR | Status: DC | PRN

## 2013-04-03 MED ORDER — HYDRALAZINE HCL 20 MG/ML IJ SOLN: 20.0000 mg | Freq: Once | INTRAMUSCULAR | Status: DC

## 2013-04-03 MED ORDER — LISINOPRIL 10 MG PO TABS
10.0000 mg | ORAL_TABLET | Freq: Every day | ORAL | Status: DC
  Administered 2013-04-04: 10 mg via ORAL
  Filled 2013-04-03: qty 1

## 2013-04-03 MED ORDER — FENTANYL CITRATE 0.05 MG/ML IJ SOLN
INTRAMUSCULAR | Status: AC
  Filled 2013-04-03: qty 2

## 2013-04-03 MED ORDER — CARVEDILOL 25 MG PO TABS
25.0000 mg | ORAL_TABLET | Freq: Two times a day (BID) | ORAL | Status: DC
  Administered 2013-04-03: 25 mg via ORAL
  Filled 2013-04-03 (×2): qty 1

## 2013-04-03 MED ORDER — HYDRALAZINE HCL 20 MG/ML IJ SOLN
INTRAMUSCULAR | Status: AC
  Filled 2013-04-03: qty 1

## 2013-04-03 MED ORDER — SODIUM CHLORIDE 0.9 % IV SOLN: INTRAVENOUS | Status: AC

## 2013-04-03 MED ORDER — ACETAMINOPHEN 325 MG PO TABS: 650.0000 mg | ORAL_TABLET | ORAL | Status: DC | PRN

## 2013-04-03 MED ORDER — ENOXAPARIN SODIUM 40 MG/0.4ML ~~LOC~~ SOLN
40.0000 mg | SUBCUTANEOUS | Status: DC
  Filled 2013-04-03: qty 0.4

## 2013-04-03 MED ORDER — ASPIRIN 81 MG PO CHEW
324.0000 mg | CHEWABLE_TABLET | ORAL | Status: AC
  Administered 2013-04-03: 324 mg via ORAL
  Filled 2013-04-03: qty 4

## 2013-04-03 MED ORDER — SODIUM CHLORIDE 0.9 % IV SOLN: 250.0000 mL | INTRAVENOUS | Status: DC | PRN

## 2013-04-03 MED ORDER — MIDAZOLAM HCL 2 MG/2ML IJ SOLN
INTRAMUSCULAR | Status: AC
  Filled 2013-04-03: qty 2

## 2013-04-03 MED ORDER — CARVEDILOL 6.25 MG PO TABS: 6.2500 mg | ORAL_TABLET | Freq: Two times a day (BID) | ORAL | Status: DC

## 2013-04-03 MED ORDER — HYDRALAZINE HCL 50 MG PO TABS
50.0000 mg | ORAL_TABLET | Freq: Three times a day (TID) | ORAL | Status: DC
  Administered 2013-04-04 – 2013-04-05 (×4): 50 mg via ORAL
  Filled 2013-04-03 (×8): qty 1

## 2013-04-03 MED ORDER — ASPIRIN 81 MG PO CHEW: 324.0000 mg | CHEWABLE_TABLET | ORAL | Status: DC

## 2013-04-03 MED ORDER — CARVEDILOL 25 MG PO TABS
25.0000 mg | ORAL_TABLET | Freq: Two times a day (BID) | ORAL | Status: DC
  Administered 2013-04-04 – 2013-04-05 (×3): 25 mg via ORAL
  Filled 2013-04-03 (×5): qty 1

## 2013-04-03 MED ORDER — SODIUM CHLORIDE 0.9 % IV SOLN: INTRAVENOUS | Status: DC

## 2013-04-03 MED ORDER — SODIUM CHLORIDE 0.9 % IJ SOLN: 3.0000 mL | INTRAMUSCULAR | Status: DC | PRN

## 2013-04-03 MED ORDER — HEPARIN (PORCINE) IN NACL 2-0.9 UNIT/ML-% IJ SOLN
INTRAMUSCULAR | Status: AC
  Filled 2013-04-03: qty 1000

## 2013-04-03 MED ORDER — SODIUM CHLORIDE 0.9 % IJ SOLN
3.0000 mL | Freq: Two times a day (BID) | INTRAMUSCULAR | Status: DC
  Administered 2013-04-03: 3 mL via INTRAVENOUS

## 2013-04-03 NOTE — Plan of Care (Signed)
Problem: Consults Goal: Cardiac Cath Patient Education (See Patient Education module for education specifics.) Outcome: Completed/Met Date Met:  04/03/13 Watched video

## 2013-04-03 NOTE — Progress Notes (Signed)
Patient ID: Mark [name], male   DOB: 02/21/1951, 61 y.o.   MRN: [id number]    Subjective:  Denies SSCP, palpitations or Dyspnea Wants to eat  Objective:  Filed Vitals:   04/02/13 1324 04/02/13 2124 04/02/13 2218 04/03/13 0511  BP: 152/78 168/88 151/70 160/96  Pulse: 57 78  56  Temp:  98 F (36.7 C)  98.1 F (36.7 C)  TempSrc:  Oral  Oral  Resp: 18 20  18  Height:      Weight:    141 lb 1.5 oz (64 kg)  SpO2: 100% 95%  96%    Intake/Output from previous day:  Intake/Output Summary (Last 24 hours) at 04/03/13 0806 Last data filed at 04/03/13 0748  Gross per 24 hour  Intake    840 ml  Output   2025 ml  Net  -1185 ml    Physical Exam: Affect appropriate Healthy:  appears stated age HEENT: normal Neck supple with no adenopathy JVP normal no bruits no thyromegaly Lungs clear with no wheezing and good diaphragmatic motion Heart:  S1/S2 no murmur, no rub, gallop or click PMI normal Abdomen: benighn, BS positve, no tenderness, no AAA no bruit.  No HSM or HJR Distal pulses intact with no bruits No edema Neuro non-focal Skin warm and dry No muscular weakness   Lab Results: Basic Metabolic Panel:  Recent Labs  04/01/13 0819 04/02/13 0915  NA 140 139  K 3.2* 3.7  CL 104 101  CO2 27 28  GLUCOSE 181* 223*  BUN 15 20  CREATININE 0.92 1.19  CALCIUM 8.8 8.9  MG 1.9  --    Liver Function Tests:  Recent Labs  04/01/13 0205 04/01/13 0819  AST 22 23  ALT 10 11  ALKPHOS 88 96  BILITOT 0.3 0.4  PROT 7.4 8.0  ALBUMIN 2.8* 3.0*   CBC:  Recent Labs  04/01/13 0205 04/01/13 0819 04/02/13 0915  WBC 7.4 6.6 5.9  NEUTROABS 3.7 3.7  --   HGB 9.3* 10.3* 10.0*  HCT 29.5* 32.9* 32.3*  MCV 78.0 78.0 77.5*  PLT 310 314 335   Cardiac Enzymes:  Recent Labs  04/01/13 0205 04/01/13 0820  TROPONINI <0.30 <0.30   Hemoglobin A1C:  Recent Labs  04/02/13 0915  HGBA1C 5.5   Fasting Lipid Panel:  Recent Labs  04/02/13 0915  CHOL 139  HDL 60  LDLCALC 51   TRIG 140  CHOLHDL 2.3   Thyroid Function Tests:  Recent Labs  04/01/13 0819  TSH 2.066   Anemia Panel:  Recent Labs  04/01/13 0819  VITAMINB12 630  FOLATE >20.0  FERRITIN 23  TIBC 370  IRON 18*  RETICCTPCT 1.4    Imaging: No results found.  Cardiac Studies:  ECG:  Orders placed during the hospital encounter of 04/01/13  . EKG 12-LEAD  . EKG 12-LEAD     Telemetry:  Echo:   Medications:   . aspirin EC  81 mg Oral Daily  . cloNIDine  0.3 mg Oral BID  . clopidogrel  75 mg Oral Q breakfast  . docusate sodium  200 mg Oral BID  . enoxaparin (LOVENOX) injection  40 mg Subcutaneous Q24H  . folic acid  1 mg Oral Daily  . furosemide  60 mg Intravenous Q8H  . hydrALAZINE  25 mg Oral Q8H  . lisinopril  5 mg Oral Daily  . LORazepam  0-4 mg Oral Q6H   Followed by  . LORazepam  0-4 mg Oral Q12H  . metoprolol    200 mg Oral Daily  . multivitamin with minerals  1 tablet Oral Daily  . potassium chloride  30 mEq Oral BID  . sodium chloride  3 mL Intravenous Q12H  . sodium chloride  3 mL Intravenous Q12H  . thiamine  100 mg Oral Daily   Or  . thiamine  100 mg Intravenous Daily       Assessment/Plan:  CHF:  Improved CXR better but still abnormal  Change over to PO lasix post cath.   HTN  Improved continue multi-drug Rx CVA-  Already on plavix  If he has CAD may need P2Y test   For right and left heart cath today to r/o ischemic DCM  Mark [name] 04/03/2013, 8:06 AM     

## 2013-04-03 NOTE — CV Procedure (Addendum)
Cardiac Cath Procedure Note  Indication: CHF and chest pain  Procedures performed:  1) Right heart cathererization 2) Selective coronary angiography 3) Left heart catheterization 4) Left ventriculogram  Description of procedure:     The risks and indication of the procedure were explained. Consent was signed and placed on the chart. An appropriate timeout was taken prior to the procedure. The right groin was prepped and draped in the routine sterile fashion and anesthetized with 1% local lidocaine.   A 5 FR arterial sheath was placed in the right femoral artery using a modified Seldinger technique. Standard catheters including a JL4, JR4 and angled pigtail were used. All catheter exchanges were made over a wire. A 7 FR venous sheath was placed in the right femoral vein using a modified Seldinger technique. A standard Swan-Ganz catheter was used for the procedure.   Complications:  None apparent  Findings:  RA = 2 RV = 33/0/2 PA = 32/9 (19) PCW = 6 Fick cardiac output/index = 5.1/2.7 PVR = 2.5 Woods SVR = 1670  FA sat = 99% PA sat = 64%, 68% Ao Pressure: 163/70 (108) LV Pressure: 182/0/15 There was no signficant gradient across the aortic valve on pullback.  Left main: Normal.  LAD: Long vessel wraps apex. Gives off 2 small diagonals. 30% stenosis in proximal LAD  LCX: Codominant vessel. Gives off tiny OM-1. Small OM2, OM-3 and PDA. 30% ostial lesion in LCX. Diffuse mild plaquing through mid AV groove LCX and OMs. Diffuse 50-60% stenosis of distal LCX and L PDA.   RCA: Small to moderate codominant vessel. 70% long proximal to mid lesion. 30-40% distal RCA just after distal bend. In very distal vessel long area of 50-60% stenosis with 70-80% lesions on proximal and distal ends.  LV-gram done in the RAO projection: Ejection fraction = 25% with global HK  Assessment: 1. Diffuse moderate CAD as described above with tandem 80% lesions in distal RCA 2. 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 Low filling pressures with preserved cardiac output 4. Severe HTN  Plan/Discussion:  He has significant CAD in distal in RCA but cardiomyopathy well out of proportion to CAD. Given small size of RCA and h/o GIB would favor medical management of CAD. Can consider PCI for intractable angina. Hold lasix today. Can switch to po tomorrow or Sunday. Switch metoprolol to carvedilol. Given severe LV dysfunction would attempt to wean clonidine in favor of more HF-friendly anti-hypertensives.   Arvilla Meres, MD 3:12 PM

## 2013-04-03 NOTE — Progress Notes (Signed)
Pt returned to room 4731 per stretcher. Moved to bed. VS done cath site observed pulses good No s/s of bleeding site Rt groin level 1. Alert/oriented HOB up 10 degrees. 02 placed on @ 2 l n/c. Placed back on telemetry monitor

## 2013-04-03 NOTE — Plan of Care (Signed)
Problem: Phase I Progression Outcomes Goal: EF % per last Echo/documented,Core Reminder form on chart Outcome: Completed/Met Date Met:  04/03/13 EF 35-40% per echo on 04/01/13

## 2013-04-03 NOTE — Interval H&P Note (Signed)
History and Physical Interval Note:  04/03/2013 2:46 PM  Mark [name]  has presented today for surgery, with the diagnosis of Chest pain and heart failure  The various methods of treatment have been discussed with the patient and family. After consideration of risks, benefits and other options for treatment, the patient has consented to  Procedure(s): LEFT AND RIGHT HEART CATHETERIZATION WITH CORONARY ANGIOGRAM (N/A) as a surgical intervention .  The patient's history has been reviewed, patient examined, no change in status, stable for surgery.  I have reviewed the patient's chart and labs.  Questions were answered to the patient's satisfaction.     [name]

## 2013-04-03 NOTE — H&P (View-Only) (Signed)
Patient ID: Hoang Pettingill, male   DOB: [birth year], 62 y.o.   MRN: [id number]    Subjective:  Denies SSCP, palpitations or Dyspnea Wants to eat  Objective:  Filed Vitals:   04/02/13 1324 04/02/13 2124 04/02/13 2218 04/03/13 0511  BP: 152/78 168/88 151/70 160/96  Pulse: 57 78  56  Temp:  98 F (36.7 C)  98.1 F (36.7 C)  TempSrc:  Oral  Oral  Resp: 18 20  18   Height:      Weight:    141 lb 1.5 oz (64 kg)  SpO2: 100% 95%  96%    Intake/Output from previous day:  Intake/Output Summary (Last 24 hours) at 04/03/13 0806 Last data filed at 04/03/13 0748  Gross per 24 hour  Intake    840 ml  Output   2025 ml  Net  -1185 ml    Physical Exam: Affect appropriate Healthy:  appears stated age HEENT: normal Neck supple with no adenopathy JVP normal no bruits no thyromegaly Lungs clear with no wheezing and good diaphragmatic motion Heart:  S1/S2 no murmur, no rub, gallop or click PMI normal Abdomen: benighn, BS positve, no tenderness, no AAA no bruit.  No HSM or HJR Distal pulses intact with no bruits No edema Neuro non-focal Skin warm and dry No muscular weakness   Lab Results: Basic Metabolic Panel:  Recent Labs  11/91/47 0819 04/02/13 0915  NA 140 139  K 3.2* 3.7  CL 104 101  CO2 27 28  GLUCOSE 181* 223*  BUN 15 20  CREATININE 0.92 1.19  CALCIUM 8.8 8.9  MG 1.9  --    Liver Function Tests:  Recent Labs  04/01/13 0205 04/01/13 0819  AST 22 23  ALT 10 11  ALKPHOS 88 96  BILITOT 0.3 0.4  PROT 7.4 8.0  ALBUMIN 2.8* 3.0*   CBC:  Recent Labs  04/01/13 0205 04/01/13 0819 04/02/13 0915  WBC 7.4 6.6 5.9  NEUTROABS 3.7 3.7  --   HGB 9.3* 10.3* 10.0*  HCT 29.5* 32.9* 32.3*  MCV 78.0 78.0 77.5*  PLT 310 314 335   Cardiac Enzymes:  Recent Labs  04/01/13 0205 04/01/13 0820  TROPONINI <0.30 <0.30   Hemoglobin A1C:  Recent Labs  04/02/13 0915  HGBA1C 5.5   Fasting Lipid Panel:  Recent Labs  04/02/13 0915  CHOL 139  HDL 60  LDLCALC 51   TRIG 829  CHOLHDL 2.3   Thyroid Function Tests:  Recent Labs  04/01/13 0819  TSH 2.066   Anemia Panel:  Recent Labs  04/01/13 0819  VITAMINB12 630  FOLATE >20.0  FERRITIN 23  TIBC 370  IRON 18*  RETICCTPCT 1.4    Imaging: No results found.  Cardiac Studies:  ECG:  Orders placed during the hospital encounter of 04/01/13  . EKG 12-LEAD  . EKG 12-LEAD     Telemetry:  Echo:   Medications:   . aspirin EC  81 mg Oral Daily  . cloNIDine  0.3 mg Oral BID  . clopidogrel  75 mg Oral Q breakfast  . docusate sodium  200 mg Oral BID  . enoxaparin (LOVENOX) injection  40 mg Subcutaneous Q24H  . folic acid  1 mg Oral Daily  . furosemide  60 mg Intravenous Q8H  . hydrALAZINE  25 mg Oral Q8H  . lisinopril  5 mg Oral Daily  . LORazepam  0-4 mg Oral Q6H   Followed by  . LORazepam  0-4 mg Oral Q12H  . metoprolol  200 mg Oral Daily  . multivitamin with minerals  1 tablet Oral Daily  . potassium chloride  30 mEq Oral BID  . sodium chloride  3 mL Intravenous Q12H  . sodium chloride  3 mL Intravenous Q12H  . thiamine  100 mg Oral Daily   Or  . thiamine  100 mg Intravenous Daily       Assessment/Plan:  CHF:  Improved CXR better but still abnormal  Change over to PO lasix post cath.   HTN  Improved continue multi-drug Rx CVA-  Already on plavix  If he has CAD may need P2Y test   For right and left heart cath today to r/o ischemic DCM  Charlton Haws 04/03/2013, 8:06 AM

## 2013-04-03 NOTE — Progress Notes (Signed)
[facility] PROGRESS NOTE  Mark [name] JYN:[id number] DOB: [birth year] DOA: 04/01/2013 PCP: No PCP Per Patient  Assessment/Plan: Acute Combined CHF -ECHO with EF of 35-40%. -Cards consulted and cath will be done todayto r/o ischemic cardiomyopathy. -ETOH cardiomyopathy is another possibility. -Continue current lasix dose. -He is negative 4.9 L since admission. -Continue ASA/ACE-I/BB. -Watch renal function with newly started ACE-I.  ETOH Abuse -No sign of withdrawals. -Thiamine/folate.  HTN -Fair control. -Continue to monitor and adjust meds as needed.  Hypokalemia -2/2 diuresis. -Repleted. -Mg ok at 1.9.  Prolonged QT -Follow with correction of hypokalemia. -Avoid causative meds.  Code Status: Full code Family Communication: Patient only  Disposition Plan: Home once ready. Anticipate 24-48 hours.   Consultants:  [facility]   Antibiotics:  None   Subjective: No complaints. Feels much better.  Objective: Filed Vitals:   04/02/13 2124 04/02/13 2218 04/03/13 0511 04/03/13 1100  BP: 168/88 151/70 160/96 152/92  Pulse: 78  56 60  Temp: 98 F (36.7 C)  98.1 F (36.7 C)   TempSrc: Oral  Oral   Resp: 20  18   Height:      Weight:   64 kg (141 lb 1.5 oz)   SpO2: 95%  96%     Intake/Output Summary (Last 24 hours) at 04/03/13 1337 Last data filed at 04/03/13 1142  Gross per 24 hour  Intake    240 ml  Output   1600 ml  Net  -1360 ml   Filed Weights   04/01/13 0551 04/02/13 0432 04/03/13 0511  Weight: 68 kg (149 lb 14.6 oz) 67.4 kg (148 lb 9.4 oz) 64 kg (141 lb 1.5 oz)    Exam:   General:  AA Ox3, NAD  Cardiovascular: RRR, no M/R/G  Respiratory: CTA B  Abdomen: S/NT/ND/+BS/no masses or organomegaly noted  Extremities: 1+ edema bilaterally   Neurologic:  Grossly intact and non-focal  Data Reviewed: Basic Metabolic Panel:  Recent Labs Lab 04/01/13 0205 04/01/13 0819 04/02/13 0915 04/03/13 0750  NA 142 140 139 140  K 3.1* 3.2*  3.7 3.9  CL 107 104 101 99  CO2 26 27 28 31   GLUCOSE 121* 181* 223* 126*  BUN 16 15 20 22   CREATININE 0.93 0.92 1.19 1.27  CALCIUM 8.6 8.8 8.9 9.4  MG  --  1.9  --   --    Liver Function Tests:  Recent Labs Lab 04/01/13 0205 04/01/13 0819  AST 22 23  ALT 10 11  ALKPHOS 88 96  BILITOT 0.3 0.4  PROT 7.4 8.0  ALBUMIN 2.8* 3.0*   No results found for this basename: LIPASE, AMYLASE,  in the last 168 hours No results found for this basename: AMMONIA,  in the last 168 hours CBC:  Recent Labs Lab 04/01/13 0205 04/01/13 0819 04/02/13 0915 04/03/13 0750  WBC 7.4 6.6 5.9 7.8  NEUTROABS 3.7 3.7  --   --   HGB 9.3* 10.3* 10.0* 10.9*  HCT 29.5* 32.9* 32.3* 34.7*  MCV 78.0 78.0 77.5* 77.1*  PLT 310 314 335 363   Cardiac Enzymes:  Recent Labs Lab 04/01/13 0205 04/01/13 0820  TROPONINI <0.30 <0.30   BNP (last 3 results)  Recent Labs  04/01/13 0235  PROBNP 6227.0*   CBG: No results found for this basename: GLUCAP,  in the last 168 hours  No results found for this or any previous visit (from the past 240 hour(s)).   Studies: No results found.  Scheduled Meds: . aspirin  324 mg Oral  Pre-Cath  . aspirin EC  81 mg Oral Daily  . cloNIDine  0.3 mg Oral BID  . clopidogrel  75 mg Oral Q breakfast  . docusate sodium  200 mg Oral BID  . enoxaparin (LOVENOX) injection  40 mg Subcutaneous Q24H  . folic acid  1 mg Oral Daily  . furosemide  60 mg Intravenous Q8H  . hydrALAZINE  25 mg Oral Q8H  . lisinopril  5 mg Oral Daily  . LORazepam  0-4 mg Oral Q6H   Followed by  . LORazepam  0-4 mg Oral Q12H  . metoprolol  200 mg Oral Daily  . multivitamin with minerals  1 tablet Oral Daily  . potassium chloride  30 mEq Oral BID  . sodium chloride  3 mL Intravenous Q12H  . sodium chloride  3 mL Intravenous Q12H  . sodium chloride  3 mL Intravenous Q12H  . thiamine  100 mg Oral Daily   Or  . thiamine  100 mg Intravenous Daily   Continuous Infusions: . sodium chloride       Principal Problem:   Acute combined systolic and diastolic CHF, NYHA class 1 Active Problems:   Essential hypertension, benign   Anemia of chronic disease   ETOH abuse    Time spent: 35 minutes.    Chaya Jan  [facility] Pager [phone]  If 7PM-7AM, please contact night-coverage at [email or web address], password [facility] 04/03/2013, 1:37 PM  LOS: 2 days

## 2013-04-04 ENCOUNTER — Encounter (HOSPITAL_COMMUNITY): Payer: Self-pay | Admitting: Interventional Cardiology

## 2013-04-04 DIAGNOSIS — I2510 Atherosclerotic heart disease of native coronary artery without angina pectoris: Secondary | ICD-10-CM | POA: Insufficient documentation

## 2013-04-04 DIAGNOSIS — N179 Acute kidney failure, unspecified: Secondary | ICD-10-CM

## 2013-04-04 LAB — BASIC METABOLIC PANEL
CO2: 28 mEq/L (ref 19–32)
Chloride: 99 mEq/L (ref 96–112)
Glucose, Bld: 106 mg/dL — ABNORMAL HIGH (ref 70–99)
Sodium: 138 mEq/L (ref 135–145)

## 2013-04-04 MED ORDER — CLONIDINE HCL 0.1 MG PO TABS
0.1000 mg | ORAL_TABLET | Freq: Every day | ORAL | Status: DC
  Administered 2013-04-05: 0.1 mg via ORAL
  Filled 2013-04-04: qty 1

## 2013-04-04 MED ORDER — SODIUM CHLORIDE 0.9 % IV SOLN
INTRAVENOUS | Status: AC
  Administered 2013-04-04: 12:00:00 via INTRAVENOUS

## 2013-04-04 MED ORDER — CLONIDINE HCL 0.1 MG PO TABS
0.1000 mg | ORAL_TABLET | Freq: Two times a day (BID) | ORAL | Status: DC
  Administered 2013-04-04: 0.1 mg via ORAL
  Filled 2013-04-04 (×2): qty 1

## 2013-04-04 MED ORDER — FUROSEMIDE 20 MG PO TABS
20.0000 mg | ORAL_TABLET | Freq: Every day | ORAL | Status: DC
  Administered 2013-04-04: 20 mg via ORAL
  Filled 2013-04-04: qty 1

## 2013-04-04 MED ORDER — ZOLPIDEM TARTRATE 5 MG PO TABS
5.0000 mg | ORAL_TABLET | Freq: Every evening | ORAL | Status: DC | PRN
  Administered 2013-04-04: 5 mg via ORAL
  Filled 2013-04-04: qty 1

## 2013-04-04 NOTE — Progress Notes (Signed)
[facility] PROGRESS NOTE  Mark [name] WUJ:[id number] DOB: 1950-10-31 DOA: 04/01/2013 PCP: No PCP Per Patient  Assessment/Plan: Acute Combined CHF -ECHO with EF of 35-40%. -Performed yesterday shows RCA disease but since vessel is small medical management is preferred.. -ETOH cardiomyopathy is another possibility. -Will Lasix given post cath renal failure. -He is negative 5.3 L since admission. -Continue ASA/ACE-I/BB.  ETOH Abuse -No sign of withdrawals. -Thiamine/folate.  HTN -Fair control. -Continue to monitor and adjust meds as needed.  Hypokalemia -2/2 diuresis. -Repleted. -Mg ok at 1.9.  Prolonged QT -Follow with correction of hypokalemia. -Avoid causative meds.  Acute renal failure -Likely related to diuresis plus contrast received from cath. -Hold Lasix and lisinopril. -Agree with some IV fluids as ordered by cardiology. -Recheck renal function in a.m.  Code Status: Full code Family Communication: Patient only  Disposition Plan: Home once ready. Anticipate 24-48 hours.   Consultants:  [facility]   Antibiotics:  None   Subjective: No complaints. Not short of breath  Objective: Filed Vitals:   04/03/13 2201 04/04/13 0100 04/04/13 0501 04/04/13 0957  BP: 98/49 101/53 115/56 110/52  Pulse: 61 63 56 60  Temp:   98.2 F (36.8 C)   TempSrc:   Oral   Resp:   18   Height:      Weight:   65.2 kg (143 lb 11.8 oz)   SpO2:   99%     Intake/Output Summary (Last 24 hours) at 04/04/13 1201 Last data filed at 04/04/13 1017  Gross per 24 hour  Intake    120 ml  Output    450 ml  Net   -330 ml   Filed Weights   04/02/13 0432 04/03/13 0511 04/04/13 0501  Weight: 67.4 kg (148 lb 9.4 oz) 64 kg (141 lb 1.5 oz) 65.2 kg (143 lb 11.8 oz)    Exam:   General:  AA Ox3, NAD  Cardiovascular: RRR, no M/R/G  Respiratory: CTA B  Abdomen: S/NT/ND/+BS/no masses or organomegaly noted  Extremities: No clubbing, cyanosis or edema, positive pedal  pulses  Neurologic:  Grossly intact and non-focal  Data Reviewed: Basic Metabolic Panel:  Recent Labs Lab 04/01/13 0205 04/01/13 0819 04/02/13 0915 04/03/13 0750 04/04/13 0450  NA 142 140 139 140 138  K 3.1* 3.2* 3.7 3.9 4.2  CL 107 104 101 99 99  CO2 26 27 28 31 28   GLUCOSE 121* 181* 223* 126* 106*  BUN 16 15 20 22  36*  CREATININE 0.93 0.92 1.19 1.27 1.86*  CALCIUM 8.6 8.8 8.9 9.4 9.0  MG  --  1.9  --   --   --    Liver Function Tests:  Recent Labs Lab 04/01/13 0205 04/01/13 0819  AST 22 23  ALT 10 11  ALKPHOS 88 96  BILITOT 0.3 0.4  PROT 7.4 8.0  ALBUMIN 2.8* 3.0*   No results found for this basename: LIPASE, AMYLASE,  in the last 168 hours No results found for this basename: AMMONIA,  in the last 168 hours CBC:  Recent Labs Lab 04/01/13 0205 04/01/13 0819 04/02/13 0915 04/03/13 0750  WBC 7.4 6.6 5.9 7.8  NEUTROABS 3.7 3.7  --   --   HGB 9.3* 10.3* 10.0* 10.9*  HCT 29.5* 32.9* 32.3* 34.7*  MCV 78.0 78.0 77.5* 77.1*  PLT 310 314 335 363   Cardiac Enzymes:  Recent Labs Lab 04/01/13 0205 04/01/13 0820  TROPONINI <0.30 <0.30   BNP (last 3 results)  Recent Labs  04/01/13 0235  PROBNP  6227.0*   CBG: No results found for this basename: GLUCAP,  in the last 168 hours  No results found for this or any previous visit (from the past 240 hour(s)).   Studies: No results found.  Scheduled Meds: . aspirin EC  81 mg Oral Daily  . carvedilol  25 mg Oral BID WC  . [START ON 04/05/2013] cloNIDine  0.1 mg Oral Daily  . clopidogrel  75 mg Oral Q breakfast  . docusate sodium  200 mg Oral BID  . enoxaparin (LOVENOX) injection  40 mg Subcutaneous Q24H  . folic acid  1 mg Oral Daily  . hydrALAZINE  20 mg Intravenous Once  . hydrALAZINE  50 mg Oral Q8H  . LORazepam  0-4 mg Oral Q12H  . multivitamin with minerals  1 tablet Oral Daily  . potassium chloride  30 mEq Oral BID  . sodium chloride  3 mL Intravenous Q12H  . sodium chloride  3 mL Intravenous  Q12H  . thiamine  100 mg Oral Daily   Or  . thiamine  100 mg Intravenous Daily   Continuous Infusions: . sodium chloride    . sodium chloride 75 mL/hr at 04/04/13 1200    Principal Problem:   Acute combined systolic and diastolic CHF, NYHA class 1 Active Problems:   Essential hypertension, benign   Anemia of chronic disease   ETOH abuse   ARF (acute renal failure)    Time spent: 35 minutes.    Chaya Jan  [facility] Pager [phone]  If 7PM-7AM, please contact night-coverage at [email or web address], password [email or web address] 04/04/2013, 12:01 PM  LOS: 3 days

## 2013-04-04 NOTE — Progress Notes (Signed)
Pt BP 98/49, night dose of clonidine and hydralazine not given.

## 2013-04-04 NOTE — Progress Notes (Signed)
SUBJECTIVE:  No CP or SHOB. INcreased Cr. today post cath.  Severe RCA disease but vessel is small and medical mgmt preferred.  OBJECTIVE:   Vitals:   Filed Vitals:   04/03/13 2201 04/04/13 0100 04/04/13 0501 04/04/13 0957  BP: 98/49 101/53 115/56 110/52  Pulse: 61 63 56 60  Temp:   98.2 F (36.8 C)   TempSrc:   Oral   Resp:   18   Height:      Weight:   65.2 kg (143 lb 11.8 oz)   SpO2:   99%    I&O's:   Intake/Output Summary (Last 24 hours) at 04/04/13 1125 Last data filed at 04/04/13 1017  Gross per 24 hour  Intake    120 ml  Output    750 ml  Net   -630 ml   TELEMETRY: Reviewed telemetry pt in NSR:     PHYSICAL EXAM General: Well developed, well nourished, in no acute distress Head:Normal cephalic and atramatic  Lungs:  Clear bilaterally to auscultation and percussion. Heart:  HRRR S1 S2  Abdomen:  abdomen soft and non-tender Msk:   Normal strength and tone for age. Extremities: No edema.  DP +1, no right groin hematoma Neuro: Alert and oriented X 3. Psych:  Normal affect, responds appropriately   LABS: Basic Metabolic Panel:  Recent Labs  16/10/96 0750 04/04/13 0450  NA 140 138  K 3.9 4.2  CL 99 99  CO2 31 28  GLUCOSE 126* 106*  BUN 22 36*  CREATININE 1.27 1.86*  CALCIUM 9.4 9.0   Liver Function Tests: No results found for this basename: AST, ALT, ALKPHOS, BILITOT, PROT, ALBUMIN,  in the last 72 hours No results found for this basename: LIPASE, AMYLASE,  in the last 72 hours CBC:  Recent Labs  04/02/13 0915 04/03/13 0750  WBC 5.9 7.8  HGB 10.0* 10.9*  HCT 32.3* 34.7*  MCV 77.5* 77.1*  PLT 335 363   Cardiac Enzymes: No results found for this basename: CKTOTAL, CKMB, CKMBINDEX, TROPONINI,  in the last 72 hours BNP: No components found with this basename: POCBNP,  D-Dimer: No results found for this basename: DDIMER,  in the last 72 hours Hemoglobin A1C:  Recent Labs  04/02/13 0915  HGBA1C 5.5   Fasting Lipid Panel:  Recent Labs  04/02/13 0915  CHOL 139  HDL 60  LDLCALC 51  TRIG 140  CHOLHDL 2.3   Thyroid Function Tests: No results found for this basename: TSH, T4TOTAL, FREET3, T3FREE, THYROIDAB,  in the last 72 hours Anemia Panel: No results found for this basename: VITAMINB12, FOLATE, FERRITIN, TIBC, IRON, RETICCTPCT,  in the last 72 hours Coag Panel:   Lab Results  Component Value Date   INR 1.06 04/03/2013   INR 1.09 02/27/2013   INR 1.05 02/26/2013    RADIOLOGY: Dg Chest 2 View  04/01/2013   *RADIOLOGY REPORT*  Clinical Data: Shortness of breath.  CHEST - 2 VIEW  Comparison: Chest x-ray 02/26/2013.  Findings: Patchy areas of interstitial prominence and ground-glass attenuation are noted in the lungs bilaterally, most pronounced throughout the left mid lung and the right lower lung.  Diffuse peribronchial cuffing.  Chronic small to moderate left pleural effusion and small right pleural effusion.  No evidence of pulmonary edema.  Heart size is normal. The patient is rotated to the left on today's exam, resulting in distortion of the mediastinal contours and reduced diagnostic sensitivity and specificity for mediastinal pathology.  Atherosclerotic calcifications are noted within the thoracic  aorta.  IMPRESSION: 1.  Overall, aeration has improved compared to the prior chest x- ray although there continues to be extensive interstitial and airspace disease in the lungs bilaterally.  This may represent a resolving multilobar pneumonia. 2.  Small to moderate left and small right-sided pleural effusions. 3.  Atherosclerosis.   Original Report Authenticated By: Trudie Reed, M.D.   Nm Pet Image Initial (pi) Skull Base To Thigh  03/17/2013   *RADIOLOGY REPORT*  Clinical Data: Initial treatment strategy for staging 4 lung cancer.  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  Surgery in 2009.  NUCLEAR MEDICINE PET SKULL BASE TO THIGH  Fasting Blood Glucose:  102  Technique:  14.4 mCi F-18 FDG was  injected intravenously. CT data was obtained and used for attenuation correction and anatomic localization only.  (This was not acquired as a diagnostic CT examination.) Additional exam technical data entered on technologist worksheet.  Comparison:  Chest, abdomen, and pelvic CTs of 02/26/2013.  The office note of 03/09/2013.  Findings:  Neck: No abnormal hypermetabolism.  Chest:  Moderate hypermetabolism which corresponds to patchy left upper lobe ground-glass and interstitial/airspace opacities.  This measures a S.U.V. max of 7.4, including on image 98/series 2.  More low level hypermetabolism is identified within the inferior aspect of the remaining left lung.  When compared to 02/26/2013, this pulmonary process has become more confluent and progressed in the superior aspect of the remaining left lung but  partially regressed in the more inferior aspect of the left lung.  Similarly, low level nonspecific hypermetabolism is identified throughout the right lung.  This corresponds to areas of right lower lobe interstitial and airspace opacity.  The right lower lobe process is decreased since the 02/26/2013 CT.  Nonspecific low level level hypermetabolism is identified within the remaining left sided pleural fluid thickening.  This measures a S.U.V. max of 3.1.  A right paratracheal node measures 8 mm and a S.U.V. max of 2.8 on image 87.  This is similar to the surrounding mediastinal pool and therefore indeterminate.  Abdomen/Pelvis:  No hypermetabolism to correspond to the left liver lobe hypoattenuating lesion, which is identified on image 141 2.0 cm.  The medial left adrenal nodule measures 9 mm and demonstrates low level hypermetabolism, similar to the hepatic uptake.  This measures a S.U.V. max of 2.9.  Nodule which is positioned just inferior to the proximal sigmoid colon, but is favored to represent a left external iliac node, measures 7 mm anda S.U.V. max of 5.0 on image 216. A left external iliac node  measures 1.5 cm and a S.U.V. max of 4.0 on image 224.  Skeleton:  Low level hypermetabolism surrounding the proximal left femur, at the site of fixation of prior subcapital femoral fracture.  CT  images performed for attenuation correction demonstrate no significant findings within the neck.  Chest, abdomen, and pelvic findings deferred to recent diagnostic CTs.  The small right pleural effusion is similar. Mild pleural thickening remains. Decreased to resolved left-sided pleural fluid.  Left renal cyst.  Cholelithiasis.  Mild to moderate osteopenia.  IMPRESSION:  1.  Hypermetabolic left pulmonary process, status post left lower lobectomy.  Given the shifting CT appearance since 02/26/2013, favored to represent an infectious process, including atypical etiologies.  Given that the primary was adenocarcinoma ( which could have this appearance), recurrent or metachronous tumor cannot be entirely excluded but is felt less likely. 2.  Similarly, partially improved right lung process with mild hypermetabolism, favored to represent  infection. 3.  The left hepatic lobe lesion described on prior CT is not hypermetabolic. 4.  Indeterminate low level hypermetabolism corresponding to minimal left adrenal nodularity.  This could be reevaluated follow- up. 5.  Mild hypermetabolic left pelvic sidewall adenopathy.  Not in the typical drainage pattern for metastatic primary bronchogenic carcinoma.  Given the prior trauma and surgery at the proximal left femur, reactive adenopathy is favored.  This could also be reevaluated on follow-up.   Original Report Authenticated By: Jeronimo Greaves, M.D.      ASSESSMENT: CAD, cardiomyopathy, ARF  PLAN:  No angina. Medical therapy for CP.  RCA disease as noted above.  Cardiomyopathy: Lisinopril started but will hold due to acute renal insufficiency post cath.   COntinue hydralazine.  Wean off clonidine.  RA pressure 2 mm Hg and LVEDP 15 mm Hg.  Will give small amount of fluid and hold  lasix for now as well due to kidney function change.  Corky Crafts., MD  04/04/2013  11:25 AM

## 2013-04-04 NOTE — Progress Notes (Addendum)
IV fluid started per order.  See nursing assessment. 1526  BP checked, spoke with MD prior to giving hydralazine.

## 2013-04-05 LAB — BASIC METABOLIC PANEL
CO2: 29 mEq/L (ref 19–32)
Calcium: 9 mg/dL (ref 8.4–10.5)
Creatinine, Ser: 1.24 mg/dL (ref 0.50–1.35)
Glucose, Bld: 143 mg/dL — ABNORMAL HIGH (ref 70–99)

## 2013-04-05 MED ORDER — HYDRALAZINE HCL 50 MG PO TABS: 50.0000 mg | ORAL_TABLET | Freq: Three times a day (TID) | ORAL | Status: DC

## 2013-04-05 MED ORDER — CARVEDILOL 25 MG PO TABS: 25.0000 mg | ORAL_TABLET | Freq: Two times a day (BID) | ORAL | Status: DC

## 2013-04-05 MED ORDER — ZOLPIDEM TARTRATE 5 MG PO TABS: 5.0000 mg | ORAL_TABLET | Freq: Every evening | ORAL | Status: DC | PRN

## 2013-04-05 MED ORDER — FOLIC ACID 1 MG PO TABS: 1.0000 mg | ORAL_TABLET | Freq: Every day | ORAL | Status: DC

## 2013-04-05 MED ORDER — THIAMINE HCL 100 MG PO TABS: 100.0000 mg | ORAL_TABLET | Freq: Every day | ORAL | Status: DC

## 2013-04-05 MED ORDER — CLONIDINE HCL 0.1 MG PO TABS: 0.1000 mg | ORAL_TABLET | Freq: Two times a day (BID) | ORAL | Status: DC

## 2013-04-05 NOTE — Discharge Summary (Signed)
Physician Discharge Summary  Mark [name] ZOX:[id number] DOB: [birth year] DOA: 04/01/2013  PCP: No PCP Per Patient  Admit date: 04/01/2013 Discharge date: 04/05/2013  Time spent: Greater than 30 minutes  Recommendations for Outpatient Follow-up:  -Advised to follow up with Dr. Eden Emms in the next 2 weeks. -Will need to have a BMET to follow on his renal function, to decide when to restart ACE-I and Lasix.   Discharge Diagnoses:  Principal Problem:   Acute combined systolic and diastolic CHF, NYHA class 1 Active Problems:   Essential hypertension, benign   Anemia of chronic disease   ETOH abuse   ARF (acute renal failure)   Discharge Condition: Stable and improved.  Filed Weights   04/03/13 0511 04/04/13 0501 04/05/13 0500  Weight: 64 kg (141 lb 1.5 oz) 65.2 kg (143 lb 11.8 oz) 65.091 kg (143 lb 8 oz)    History of present illness:  Patient is a 62 y.o. male with known history of hypertension who was admitted last month for pneumonia presented with progressive shortness of breath over the last 3 days. Patient states he has been getting short of breath on lying flat. Check a productive cough fever chills chest pain. He has noticed increased swelling of the lower extremities. In the ER chest x-ray shows bilateral congestion and pleural effusion and increased BNP. Patient has been admitted for CHF. Patient was given one dose of IV Lasix 40 mg with nitroglycerin paste. Patient also was found to have elevated blood pressure. Patient usually gets his care from [place] has recently moved to [place] 2 months ago. We were asked to admit him for further evaluation and management.   Hospital Course:   Acute Combined CHF  -ECHO with EF of 35-40%.  -Cath performed  shows RCA disease but since vessel is small medical management is preferred..  -ETOH cardiomyopathy is another possibility.  -He developed post-cath renal failure that has since resolved. Lasix and ACE-I on hold.  -He is negative 4.7  L since admission.  -Continue ASA/BB.   ETOH Abuse  -No sign of withdrawals.  -Thiamine/folate.   HTN  -Fair control.   Hypokalemia  -Resolved on DC -Mg ok at 1.9.   Acute renal failure  -Likely related to diuresis plus contrast received from cath.  -Hold Lasix and lisinopril.  -Cr down to 1.24 on day of DC. -Will need renal function monitored at time of DC to help decide on timing of restarting lasix and ACE-I.   Procedures:  Cardiac Catheterization   Consultations:  Cardiology, Dr. Excell Seltzer  Discharge Instructions  Discharge Orders   Future Orders Complete By Expires     Diet - low sodium heart healthy  As directed     Discontinue IV  As directed     Increase activity slowly  As directed         Medication List    STOP taking these medications       magnesium hydroxide 400 MG/5ML suspension  Commonly known as:  MILK OF MAGNESIA     metoprolol 200 MG 24 hr tablet  Commonly known as:  TOPROL-XL     verapamil 80 MG tablet  Commonly known as:  CALAN      TAKE these medications       aspirin 81 MG EC tablet  Take 1 tablet (81 mg total) by mouth daily.     carvedilol 25 MG tablet  Commonly known as:  COREG  Take 1 tablet (25 mg total) by mouth 2 (  two) times daily with a meal.     cloNIDine 0.1 MG tablet  Commonly known as:  CATAPRES  Take 1 tablet (0.1 mg total) by mouth 2 (two) times daily.     clopidogrel 75 MG tablet  Commonly known as:  PLAVIX  Take 75 mg by mouth daily.     DSS 100 MG Caps  Take 100 mg by mouth 2 (two) times daily.     folic acid 1 MG tablet  Commonly known as:  FOLVITE  Take 1 tablet (1 mg total) by mouth daily.     hydrALAZINE 50 MG tablet  Commonly known as:  APRESOLINE  Take 1 tablet (50 mg total) by mouth every 8 (eight) hours.     multivitamin with minerals Tabs  Take 1 tablet by mouth daily.     thiamine 100 MG tablet  Take 1 tablet (100 mg total) by mouth daily.     zolpidem 5 MG tablet  Commonly known as:   AMBIEN  Take 1 tablet (5 mg total) by mouth at bedtime as needed for sleep.       No Known Allergies     Follow-up Information   Follow up with Charlton Haws, MD. Schedule an appointment as soon as possible for a visit in 2 weeks.   Contact information:   [address] [place] [address] [phone]        The results of significant diagnostics from this hospitalization (including imaging, microbiology, ancillary and laboratory) are listed below for reference.    Significant Diagnostic Studies: Dg Chest 2 View  04/01/2013   *RADIOLOGY REPORT*  Clinical Data: Shortness of breath.  CHEST - 2 VIEW  Comparison: Chest x-ray 02/26/2013.  Findings: Patchy areas of interstitial prominence and ground-glass attenuation are noted in the lungs bilaterally, most pronounced throughout the left mid lung and the right lower lung.  Diffuse peribronchial cuffing.  Chronic small to moderate left pleural effusion and small right pleural effusion.  No evidence of pulmonary edema.  Heart size is normal. The patient is rotated to the left on today's exam, resulting in distortion of the mediastinal contours and reduced diagnostic sensitivity and specificity for mediastinal pathology.  Atherosclerotic calcifications are noted within the thoracic aorta.  IMPRESSION: 1.  Overall, aeration has improved compared to the prior chest x- ray although there continues to be extensive interstitial and airspace disease in the lungs bilaterally.  This may represent a resolving multilobar pneumonia. 2.  Small to moderate left and small right-sided pleural effusions. 3.  Atherosclerosis.   Original Report Authenticated By: Trudie Reed, M.D.   Nm Pet Image Initial (pi) Skull Base To Thigh  03/17/2013   *RADIOLOGY REPORT*  Clinical Data: Initial treatment strategy for staging 4 lung cancer.  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  Surgery in 2009.   NUCLEAR MEDICINE PET SKULL BASE TO THIGH  Fasting Blood Glucose:  102  Technique:  14.4 mCi F-18 FDG was injected intravenously. CT data was obtained and used for attenuation correction and anatomic localization only.  (This was not acquired as a diagnostic CT examination.) Additional exam technical data entered on technologist worksheet.  Comparison:  Chest, abdomen, and pelvic CTs of 02/26/2013.  The office note of 03/09/2013.  Findings:  Neck: No abnormal hypermetabolism.  Chest:  Moderate hypermetabolism which corresponds to patchy left upper lobe ground-glass and interstitial/airspace opacities.  This measures a S.U.V. max of 7.4, including on image 98/series  2.  More low level hypermetabolism is identified within the inferior aspect of the remaining left lung.  When compared to 02/26/2013, this pulmonary process has become more confluent and progressed in the superior aspect of the remaining left lung but  partially regressed in the more inferior aspect of the left lung.  Similarly, low level nonspecific hypermetabolism is identified throughout the right lung.  This corresponds to areas of right lower lobe interstitial and airspace opacity.  The right lower lobe process is decreased since the 02/26/2013 CT.  Nonspecific low level level hypermetabolism is identified within the remaining left sided pleural fluid thickening.  This measures a S.U.V. max of 3.1.  A right paratracheal node measures 8 mm and a S.U.V. max of 2.8 on image 87.  This is similar to the surrounding mediastinal pool and therefore indeterminate.  Abdomen/Pelvis:  No hypermetabolism to correspond to the left liver lobe hypoattenuating lesion, which is identified on image 141 2.0 cm.  The medial left adrenal nodule measures 9 mm and demonstrates low level hypermetabolism, similar to the hepatic uptake.  This measures a S.U.V. max of 2.9.  Nodule which is positioned just inferior to the proximal sigmoid colon, but is favored to represent a left  external iliac node, measures 7 mm anda S.U.V. max of 5.0 on image 216. A left external iliac node measures 1.5 cm and a S.U.V. max of 4.0 on image 224.  Skeleton:  Low level hypermetabolism surrounding the proximal left femur, at the site of fixation of prior subcapital femoral fracture.  CT  images performed for attenuation correction demonstrate no significant findings within the neck.  Chest, abdomen, and pelvic findings deferred to recent diagnostic CTs.  The small right pleural effusion is similar. Mild pleural thickening remains. Decreased to resolved left-sided pleural fluid.  Left renal cyst.  Cholelithiasis.  Mild to moderate osteopenia.  IMPRESSION:  1.  Hypermetabolic left pulmonary process, status post left lower lobectomy.  Given the shifting CT appearance since 02/26/2013, favored to represent an infectious process, including atypical etiologies.  Given that the primary was adenocarcinoma ( which could have this appearance), recurrent or metachronous tumor cannot be entirely excluded but is felt less likely. 2.  Similarly, partially improved right lung process with mild hypermetabolism, favored to represent infection. 3.  The left hepatic lobe lesion described on prior CT is not hypermetabolic. 4.  Indeterminate low level hypermetabolism corresponding to minimal left adrenal nodularity.  This could be reevaluated follow- up. 5.  Mild hypermetabolic left pelvic sidewall adenopathy.  Not in the typical drainage pattern for metastatic primary bronchogenic carcinoma.  Given the prior trauma and surgery at the proximal left femur, reactive adenopathy is favored.  This could also be reevaluated on follow-up.   Original Report Authenticated By: Jeronimo Greaves, M.D.    Microbiology: No results found for this or any previous visit (from the past 240 hour(s)).   Labs: Basic Metabolic Panel:  Recent Labs Lab 04/01/13 0819 04/02/13 0915 04/03/13 0750 04/04/13 0450 04/05/13 0528  NA 140 139 140 138  139  K 3.2* 3.7 3.9 4.2 4.0  CL 104 101 99 99 105  CO2 27 28 31 28 29   GLUCOSE 181* 223* 126* 106* 143*  BUN 15 20 22  36* 28*  CREATININE 0.92 1.19 1.27 1.86* 1.24  CALCIUM 8.8 8.9 9.4 9.0 9.0  MG 1.9  --   --   --   --    Liver Function Tests:  Recent Labs Lab 04/01/13 0205 04/01/13 4098  AST 22 23  ALT 10 11  ALKPHOS 88 96  BILITOT 0.3 0.4  PROT 7.4 8.0  ALBUMIN 2.8* 3.0*   No results found for this basename: LIPASE, AMYLASE,  in the last 168 hours No results found for this basename: AMMONIA,  in the last 168 hours CBC:  Recent Labs Lab 04/01/13 0205 04/01/13 0819 04/02/13 0915 04/03/13 0750  WBC 7.4 6.6 5.9 7.8  NEUTROABS 3.7 3.7  --   --   HGB 9.3* 10.3* 10.0* 10.9*  HCT 29.5* 32.9* 32.3* 34.7*  MCV 78.0 78.0 77.5* 77.1*  PLT 310 314 335 363   Cardiac Enzymes:  Recent Labs Lab 04/01/13 0205 04/01/13 0820  TROPONINI <0.30 <0.30   BNP: BNP (last 3 results)  Recent Labs  04/01/13 0235  PROBNP 6227.0*   CBG: No results found for this basename: GLUCAP,  in the last 168 hours     Signed:  Chaya Jan  [facility] Pager: [phone] 04/05/2013, 2:31 PM

## 2013-04-05 NOTE — Progress Notes (Addendum)
Client and in-law verbalized understanding of discharge instructions.  Rx. Given to patient for Ambien.  Client to pick up other meds at [facility] that were called in.  Transported via wheelchair to discharge area. Notified Mr. Wonda Horner that client has left phone charger and slipper here.

## 2013-04-05 NOTE — Progress Notes (Signed)
Patient ID: Mark [name], male   DOB: 02/15/1951, 62 y.o.   MRN: [id number] Subjective:  "I want to go home"  Objective:  Vital Signs in the last 24 hours: Temp:  [97.9 F (36.6 C)-98.5 F (36.9 C)] 98 F (36.7 C) (06/08 0500) Pulse Rate:  [60-75] 70 (06/08 0500) Resp:  [16-20] 16 (06/08 0500) BP: (110-161)/(46-73) 161/73 mmHg (06/08 0500) SpO2:  [99 %-100 %] 100 % (06/08 0500) Weight:  [143 lb 8 oz (65.091 kg)] 143 lb 8 oz (65.091 kg) (06/08 0500)  Intake/Output from previous day: 06/07 0701 - 06/08 0700 In: 1050 [P.O.:600; I.V.:450] Out: 625 [Urine:625] Intake/Output from this shift: Total I/O In: 240 [P.O.:240] Out: 1 [Stool:1]  Physical Exam: Well appearing 62 yo man, NAD HEENT: Unremarkable Neck:  6 cm JVD, no thyromegally Lungs:  Clear with no wheezes HEART:  Regular rate rhythm, no murmurs, no rubs, no clicks Abd:  Flat, positive bowel sounds, no organomegally, no rebound, no guarding Ext:  2 plus pulses, no edema, no cyanosis, no clubbing Skin:  No rashes no nodules Neuro:  CN II through XII intact, motor grossly intact  Lab Results:  Recent Labs  04/03/13 0750  WBC 7.8  HGB 10.9*  PLT 363    Recent Labs  04/04/13 0450 04/05/13 0528  NA 138 139  K 4.2 4.0  CL 99 105  CO2 28 29  GLUCOSE 106* 143*  BUN 36* 28*  CREATININE 1.86* 1.24   No results found for this basename: TROPONINI, CK, MB,  in the last 72 hours Hepatic Function Panel No results found for this basename: PROT, ALBUMIN, AST, ALT, ALKPHOS, BILITOT, BILIDIR, IBILI,  in the last 72 hours No results found for this basename: CHOL,  in the last 72 hours No results found for this basename: PROTIME,  in the last 72 hours  Imaging: No results found.  Cardiac Studies: Tele - nsr Assessment/Plan:  1. [place] - s/p cath, results noted 2. Acute renal insufficiency, resolved today 3. Chronic systolic CHF, well compensated Rec: ok for discharge today as renal function has returned to normal. He  will need to  followup with Dr. Eden Emms.  LOS: 4 days    Amayah Staheli,M.D. 04/05/2013, 9:55 AM

## 2013-04-08 ENCOUNTER — Encounter (HOSPITAL_COMMUNITY): Payer: Self-pay | Admitting: Emergency Medicine

## 2013-04-08 ENCOUNTER — Emergency Department (HOSPITAL_COMMUNITY)
Admission: EM | Admit: 2013-04-08 | Discharge: 2013-04-08 | Discharge status: Against Medical Advice | Payer: Medicaid Other | Attending: Emergency Medicine | Admitting: Emergency Medicine

## 2013-04-08 DIAGNOSIS — F19939 Other psychoactive substance use, unspecified with withdrawal, unspecified: Secondary | ICD-10-CM | POA: Insufficient documentation

## 2013-04-08 NOTE — ED Notes (Signed)
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

## 2013-04-12 NOTE — Progress Notes (Signed)
CSW was referred to talk with patient regarding substance abuse issues. Patient was very pleasant but declined to talk to CSW at this time and deferred interest in resources.  CSW stated would be available if he changed his mind.  He verbalized understanding.  CSW signing off.  Lorri Frederick. [place]  [phone]

## 2013-04-15 ENCOUNTER — Emergency Department (HOSPITAL_COMMUNITY)
Admission: EM | Admit: 2013-04-15 | Discharge: 2013-04-15 | Disposition: A | Discharge status: Home / Self Care | Payer: Medicaid Other | Attending: Emergency Medicine | Admitting: Emergency Medicine

## 2013-04-15 ENCOUNTER — Encounter (HOSPITAL_COMMUNITY): Payer: Self-pay | Admitting: Emergency Medicine

## 2013-04-15 DIAGNOSIS — Z8719 Personal history of other diseases of the digestive system: Secondary | ICD-10-CM | POA: Insufficient documentation

## 2013-04-15 DIAGNOSIS — Z9981 Dependence on supplemental oxygen: Secondary | ICD-10-CM | POA: Insufficient documentation

## 2013-04-15 DIAGNOSIS — Z8673 Personal history of transient ischemic attack (TIA), and cerebral infarction without residual deficits: Secondary | ICD-10-CM | POA: Insufficient documentation

## 2013-04-15 DIAGNOSIS — Z8669 Personal history of other diseases of the nervous system and sense organs: Secondary | ICD-10-CM | POA: Insufficient documentation

## 2013-04-15 DIAGNOSIS — Z7982 Long term (current) use of aspirin: Secondary | ICD-10-CM | POA: Insufficient documentation

## 2013-04-15 DIAGNOSIS — Z8659 Personal history of other mental and behavioral disorders: Secondary | ICD-10-CM | POA: Insufficient documentation

## 2013-04-15 DIAGNOSIS — I10 Essential (primary) hypertension: Secondary | ICD-10-CM

## 2013-04-15 DIAGNOSIS — G4700 Insomnia, unspecified: Secondary | ICD-10-CM | POA: Insufficient documentation

## 2013-04-15 DIAGNOSIS — Z8701 Personal history of pneumonia (recurrent): Secondary | ICD-10-CM | POA: Insufficient documentation

## 2013-04-15 DIAGNOSIS — F1910 Other psychoactive substance abuse, uncomplicated: Secondary | ICD-10-CM | POA: Insufficient documentation

## 2013-04-15 DIAGNOSIS — Z87891 Personal history of nicotine dependence: Secondary | ICD-10-CM | POA: Insufficient documentation

## 2013-04-15 DIAGNOSIS — Z8611 Personal history of tuberculosis: Secondary | ICD-10-CM | POA: Insufficient documentation

## 2013-04-15 DIAGNOSIS — Z85118 Personal history of other malignant neoplasm of bronchus and lung: Secondary | ICD-10-CM | POA: Insufficient documentation

## 2013-04-15 DIAGNOSIS — I2510 Atherosclerotic heart disease of native coronary artery without angina pectoris: Secondary | ICD-10-CM | POA: Insufficient documentation

## 2013-04-15 DIAGNOSIS — Z79899 Other long term (current) drug therapy: Secondary | ICD-10-CM | POA: Insufficient documentation

## 2013-04-15 MED ORDER — ZOLPIDEM TARTRATE 5 MG PO TABS: 5.0000 mg | ORAL_TABLET | Freq: Every evening | ORAL | Status: DC | PRN

## 2013-04-15 NOTE — ED Provider Notes (Signed)
History     CSN: [id number]  Arrival date & time 04/15/13  4098   First MD Initiated Contact with Patient 04/15/13 1027      Chief Complaint  Patient presents with  . Hypertension    (Consider location/radiation/quality/duration/timing/severity/associated sxs/prior treatment) HPI Mark [name] is a 62 y.o. male former smoker (21pk yr hx) w PMHx of stroke, HTN, drug abuse and a liver lesion that presents to the ER c/o HTN. Pt was recently admitted from 6/4-6/8 for primarily combined systolic diastolic CHF, NYHA class 1. Follow up with Dr. Estrella Myrtle is scheduled for this Friday to possibly adjust BP medications (pt was started on HCTZ and clonidine in the hospital), however pt is concerned bc his BP the last couple of days has been around 185/90. Pt denies any new symptoms at this time including CP, SOB, leg swelling, HA, change in vision, unilateral weakness, back pain, abd pain, cough, fevers, night sweats or chills. In addition he states that he was given Ambien when he was dc from hospital and has had difficulty sleeping since this medication ran out. No other complaints at this time.    Past Medical History  Diagnosis Date  . Essential hypertension, benign 02/26/2013  . Liver lesion     a. Previously biopsy-proven to be a cyst (per records from a hospital in [place]).  . Hypoxia     a. Adm 02/2013, required home O2.  . Drug abuse   . Depression   . Seizure disorder     "after hip OR 02/2012 once I got home" (04/01/2013)  . GI bleed     a. 2009 at time of stroke - received 3 u blood, no source found per pt  . HTN (hypertension)   . On home oxygen therapy     2.5L "usually use it at night" (04/01/2013)  . Tuberculosis   . Positive TB test   . History of blood transfusion 2009    "don't know what it was related to" (04/01/2013)  . Stroke 2009    a. Pt reports several, last in 2009. b. Residual L sided weakness.  . Anxiety   . Adenocarcinoma, lung     a. Dx 2013, underwent left lower  lobectomy under the care of Dr.[place]unzina in [place] for a stage I non-small cell lung CA.; "went to [facility] ~ 06/2012 and they couldn't find anything" (04/01/2013)  . Pneumonia   . Coronary atherosclerosis of native coronary artery     Past Surgical History  Procedure Laterality Date  . Hip arthroplasty Left 02/2012  . Lung biopsy Left 07/15/2012    "LLL" (04/01/2013)  . Lung removal, partial Left     /notes 02/26/2013  (04/01/2013)    Family History  Problem Relation Age of Onset  . Stroke    . CAD Neg Hx     History  Substance Use Topics  . Smoking status: Former Smoker -- 0.50 packs/day for 42 years    Types: Cigarettes    Quit date: 02/27/2012  . Smokeless tobacco: Never Used  . Alcohol Use: 29.4 oz/week    14 Cans of beer, 35 Shots of liquor per week     Comment: 04/01/2013 "~ 1/2 pint /day of rum plus 2 16oz beers/day"      Review of Systems Ten systems reviewed and are negative for acute change, except as noted in the HPI.    Allergies  Review of patient's allergies indicates no known allergies.  Home Medications   Current  Outpatient Rx  Name  Route  Sig  Dispense  Refill  . aspirin EC 81 MG EC tablet   Oral   Take 1 tablet (81 mg total) by mouth daily.         . carvedilol (COREG) 25 MG tablet   Oral   Take 1 tablet (25 mg total) by mouth 2 (two) times daily with a meal.   60 tablet   1   . cloNIDine (CATAPRES) 0.1 MG tablet   Oral   Take 1 tablet (0.1 mg total) by mouth 2 (two) times daily.   60 tablet   1   . docusate sodium 100 MG CAPS   Oral   Take 100 mg by mouth 2 (two) times daily.   10 capsule      . folic acid (FOLVITE) 1 MG tablet   Oral   Take 1 tablet (1 mg total) by mouth daily.         . hydrALAZINE (APRESOLINE) 50 MG tablet   Oral   Take 1 tablet (50 mg total) by mouth every 8 (eight) hours.   90 tablet   1   . Multiple Vitamin (MULTIVITAMIN WITH MINERALS) TABS   Oral   Take 1 tablet by mouth daily.         Marland Kitchen zolpidem  (AMBIEN) 5 MG tablet   Oral   Take 1 tablet (5 mg total) by mouth at bedtime as needed for sleep.   10 tablet   0   . thiamine 100 MG tablet   Oral   Take 1 tablet (100 mg total) by mouth daily.           BP 181/95  Pulse 82  Temp(Src) 98.4 F (36.9 C) (Oral)  Resp 18  SpO2 95%  Physical Exam  Nursing note and vitals reviewed. Constitutional: He is oriented to person, place, and time. He appears well-developed and well-nourished. No distress.  hypertensive  HENT:  Head: Normocephalic and atraumatic.  Eyes: Conjunctivae and EOM are normal.  Neck: Normal range of motion.  Cardiovascular:  RRR, intact distal pulses. No pitting edema  Pulmonary/Chest: Effort normal.  LCAB   Abdominal:  Soft nonender  Musculoskeletal: Normal range of motion.  Neurological: He is alert and oriented to person, place, and time.  Skin: Skin is warm and dry. No rash noted. He is not diaphoretic.  Psychiatric: He has a normal mood and affect. His behavior is normal.    ED Course  Procedures (including critical care time)  Labs Reviewed - No data to display No results found.   No diagnosis found.    MDM  Benign essential hypertension Insomnia  Pt refused blood work and states that he needs to leave. Follow up scheduled for Friday. Will dc w short course of Ambien         Jaci Carrel, PA-C 04/15/13 1130

## 2013-04-15 NOTE — ED Notes (Signed)
Pt reports recent high readings of his systolic blood pressure recently this past week in the 200s. Pt denies dizziness or headache. Pt states has an appt with new PCP on Friday. Pts son also concerned bilateral leg swelling. Pt alert, oriented x4, VSS.

## 2013-04-15 NOTE — ED Provider Notes (Signed)
Medical screening examination/treatment/procedure(s) were performed by non-physician practitioner and as supervising physician I was immediately available for consultation/collaboration.  Dione Booze, MD 04/15/13 [phone]

## 2013-04-15 NOTE — ED Notes (Signed)
Pt discharged.Pt refused for blood test and wanting to go home.GCS 15

## 2013-04-17 ENCOUNTER — Ambulatory Visit: Payer: Self-pay | Admitting: Nurse Practitioner

## 2013-05-12 ENCOUNTER — Encounter: Payer: Self-pay | Admitting: Nurse Practitioner

## 2013-05-12 ENCOUNTER — Ambulatory Visit (INDEPENDENT_AMBULATORY_CARE_PROVIDER_SITE_OTHER): Payer: Self-pay | Admitting: Nurse Practitioner

## 2013-05-12 VITALS — BP 142/70 | HR 68 | Ht 71.5 in | Wt 153.0 lb

## 2013-05-12 DIAGNOSIS — I259 Chronic ischemic heart disease, unspecified: Secondary | ICD-10-CM

## 2013-05-12 DIAGNOSIS — I5022 Chronic systolic (congestive) heart failure: Secondary | ICD-10-CM

## 2013-05-12 DIAGNOSIS — R0609 Other forms of dyspnea: Secondary | ICD-10-CM

## 2013-05-12 DIAGNOSIS — R0600 Dyspnea, unspecified: Secondary | ICD-10-CM

## 2013-05-12 LAB — BASIC METABOLIC PANEL
BUN: 11 mg/dL (ref 6–23)
CO2: 32 mEq/L (ref 19–32)
Calcium: 8.8 mg/dL (ref 8.4–10.5)
Chloride: 99 mEq/L (ref 96–112)
Creatinine, Ser: 1.2 mg/dL (ref 0.4–1.5)
GFR: 79.41 mL/min (ref >60.00)
Glucose, Bld: 102 mg/dL — ABNORMAL HIGH (ref 70–99)
Potassium: 3.4 mEq/L — ABNORMAL LOW (ref 3.5–5.1)
Sodium: 139 mEq/L (ref 135–145)

## 2013-05-12 LAB — CBC WITH DIFFERENTIAL/PLATELET
Basophils Absolute: 0 10*3/uL (ref 0.0–0.1)
Basophils Relative: 0 % (ref 0.0–3.0)
Eosinophils Absolute: 0.4 10*3/uL (ref 0.0–0.7)
Eosinophils Relative: 4.4 % (ref 0.0–5.0)
HCT: 26.8 % — ABNORMAL LOW (ref 39.0–52.0)
Hemoglobin: 8.2 g/dL — ABNORMAL LOW (ref 13.0–17.0)
Lymphocytes Relative: 13.9 % (ref 12.0–46.0)
Lymphs Abs: 1.4 10*3/uL (ref 0.7–4.0)
MCHC: 30.6 g/dL (ref 30.0–36.0)
MCV: 75.8 fl — ABNORMAL LOW (ref 78.0–100.0)
Monocytes Absolute: 0.5 10*3/uL (ref 0.1–1.0)
Monocytes Relative: 5.2 % (ref 3.0–12.0)
Neutro Abs: 7.5 10*3/uL (ref 1.4–7.7)
Neutrophils Relative %: 76.5 % (ref 43.0–77.0)
Platelets: 331 10*3/uL (ref 150.0–400.0)
RBC: 3.54 Mil/uL — ABNORMAL LOW (ref 4.22–5.81)
RDW: 18.1 % — ABNORMAL HIGH (ref 11.5–14.6)
WBC: 9.8 10*3/uL (ref 4.5–10.5)

## 2013-05-12 LAB — BRAIN NATRIURETIC PEPTIDE: Pro B Natriuretic peptide (BNP): 775 pg/mL — ABNORMAL HIGH (ref 0.0–100.0)

## 2013-05-12 MED ORDER — FUROSEMIDE 20 MG PO TABS: 20.0000 mg | ORAL_TABLET | Freq: Every day | ORAL | Status: DC

## 2013-05-12 MED ORDER — LISINOPRIL 5 MG PO TABS: 5.0000 mg | ORAL_TABLET | Freq: Every day | ORAL | Status: DC

## 2013-05-12 NOTE — Progress Notes (Signed)
Mark [name] Date of Birth: 1951/02/06 Medical Record #[id number]  History of Present Illness: Mark [name] is seen back today for a post hospital visit. Seen for Mark [name]. Just moved to [place] about 3 months ago. He has a history of HTN, CVA, prior drug abuse (cocaine/heroin, currently on Methadone), remote seizures, ETOH abuse, remote GI bleed, HTN, depression, anemia of chronic disease and non small cell lung cancer with left lower lobectomy while living in [place] - now followed by Mark [name].   Recently admitted with progressive SOB and weight gain - echo showed EF down to 35 to 40% - in the setting of increased salt use and excessive alcohol use. The chronicity of the low EF was unclear. 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 He had worsening renal function post cath and his Lasix and ACE were held at discharge.   Comes in today. He is now 5 weeks post discharge. He is here alone. Says he is doing ok. Weight has gone up. He does have scales at home and is weighing daily. Says he is "slowing down" on the salt. Says he is no longer drinking alcohol. Some swelling. No recent labs since his discharge and creatinine was normal when he left. He does not sleep and is asking for Ambien. No PCP. No insurance. Trying to get medicaid/medicare arranged.   Current Outpatient Prescriptions  Medication Sig Dispense Refill  . aspirin EC 81 MG EC tablet Take 1 tablet (81 mg total) by mouth daily.      . carvedilol (COREG) 25 MG tablet Take 1 tablet (25 mg total) by mouth 2 (two) times daily with a meal.  60 tablet  1  . cloNIDine (CATAPRES) 0.1 MG tablet Take 1 tablet (0.1 mg total) by mouth 2 (two) times daily.  60 tablet  1  . folic acid (FOLVITE) 1 MG tablet Take 1 tablet (1 mg total) by mouth daily.      . hydrALAZINE (APRESOLINE) 50 MG tablet Take 1 tablet (50 mg total) by mouth every 8 (eight) hours.  90 tablet  1  . METHADONE HCL PO Take by mouth. As  directed      . Multiple Vitamin (MULTIVITAMIN WITH MINERALS) TABS Take 1 tablet by mouth daily.      Marland Kitchen docusate sodium 100 MG CAPS Take 100 mg by mouth 2 (two) times daily.  10 capsule    . furosemide (LASIX) 20 MG tablet Take 1 tablet (20 mg total) by mouth daily.  30 tablet  3  . lisinopril (PRINIVIL,ZESTRIL) 5 MG tablet Take 1 tablet (5 mg total) by mouth daily.  30 tablet  3   No current facility-administered medications for this visit.    No Known Allergies  Past Medical History  Diagnosis Date  . Essential hypertension, benign 02/26/2013  . Liver lesion     a. Previously biopsy-proven to be a cyst (per records from a hospital in [place]).  . Hypoxia     a. Adm 02/2013, required home O2.  . Drug abuse   . Depression   . Seizure disorder     "after hip OR 02/2012 once I got home" (04/01/2013)  . GI bleed     a. 2009 at time of stroke - received 3 u blood, no source found per pt  . HTN (hypertension)   . On home oxygen therapy     2.5L "usually use it at night" (04/01/2013)  . Tuberculosis   .  Positive TB test   . History of blood transfusion 2009    "don't know what it was related to" (04/01/2013)  . Stroke 2009    a. Pt reports several, last in 2009. b. Residual L sided weakness.  . Anxiety   . Adenocarcinoma, lung     a. Dx 2013, underwent left lower lobectomy under the care of Mark [name] in [place] for a stage I non-small cell lung CA.; "went to [facility] ~ 06/2012 and they couldn't find anything" (04/01/2013)  . Pneumonia   . Coronary atherosclerosis of native coronary artery     Past Surgical History  Procedure Laterality Date  . Hip arthroplasty Left 02/2012  . Lung biopsy Left 07/15/2012    "LLL" (04/01/2013)  . Lung removal, partial Left     /notes 02/26/2013  (04/01/2013)    History  Smoking status  . Former Smoker -- 0.50 packs/day for 42 years  . Types: Cigarettes  . Quit date: 02/27/2012  Smokeless tobacco  . Never Used    History  Alcohol Use  . 29.4  oz/week  . 14 Cans of beer, 35 Shots of liquor per week    Comment: 04/01/2013 "~ 1/2 pint /day of rum plus 2 16oz beers/day"    Family History  Problem Relation Age of Onset  . Stroke    . CAD Neg Hx     Review of Systems: The review of systems is per the HPI.  All other systems were reviewed and are negative.  Physical Exam: BP 142/70  Pulse 68  Ht 5' 11.5" (1.816 m)  Wt 153 lb (69.4 kg)  BMI 21.04 kg/m2  SpO2 97% Weight up 10 pounds since discharge.  Patient is alert and in no acute distress. He is disheveled. Skin is warm and dry. Color is normal.  HEENT is unremarkable. Normocephalic/atraumatic. PERRL. Sclera are nonicteric. Neck is supple. No masses. No JVD. Lungs are clear. Cardiac exam shows a regular rate and rhythm. Abdomen is soft. Extremities are with trace edema. Gait and ROM are intact. No gross neurologic deficits noted.  LABORATORY DATA: Labs are pending for today.    Lab Results  Component Value Date   WBC 7.8 04/03/2013   HGB 10.9* 04/03/2013   HCT 34.7* 04/03/2013   PLT 363 04/03/2013   GLUCOSE 143* 04/05/2013   CHOL 139 04/02/2013   TRIG 140 04/02/2013   HDL 60 04/02/2013   LDLCALC 51 04/02/2013   ALT 11 04/01/2013   AST 23 04/01/2013   NA 139 04/05/2013   K 4.0 04/05/2013   CL 105 04/05/2013   CREATININE 1.24 04/05/2013   BUN 28* 04/05/2013   CO2 29 04/05/2013   TSH 2.066 04/01/2013   INR 1.06 04/03/2013   HGBA1C 5.5 04/02/2013   Cardiac Cath Procedure Note   Left main: Normal.  LAD: Long vessel wraps apex. Gives off 2 small diagonals. 30% stenosis in proximal LAD  LCX: Codominant vessel. Gives off tiny OM-1. Small OM2, OM-3 and PDA. 30% ostial lesion in LCX. Diffuse mild plaquing through mid AV groove LCX and OMs. Diffuse 50-60% stenosis of distal LCX and L PDA.  RCA: Small to moderate codominant vessel. 70% long proximal to mid lesion. 30-40% distal RCA just after distal bend. In very distal vessel long area of 50-60% stenosis with 70-80% lesions on proximal and distal ends.    LV-gram done in the RAO projection: Ejection fraction = 25% with global HK   Assessment:  1. Diffuse moderate CAD as  described above with tandem 80% lesions in distal RCA  2. 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 Low filling pressures with preserved cardiac output  4. Severe HTN   Plan/Discussion:  He has significant CAD in distal in RCA but cardiomyopathy well out of proportion to CAD. Given small size of RCA and h/o GIB would favor medical management of CAD. Can consider PCI for intractable angina. Hold lasix today. Can switch to po tomorrow or Sunday. Switch metoprolol to carvedilol. Given severe LV dysfunction would attempt to wean clonidine in favor of more HF-friendly anti-hypertensives.   Arvilla Meres, MD  3:12 PM    Assessment / Plan: 1. 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 Will restart his ACE but at a lower dose. Weight is up 10 pounds since discharge. Will add back low dose Lasix. Check BMET today and on return. I encouraged him to continue to weigh daily and try to avoid salt.   2. Alcohol abuse - probably a contributing factor - he says he is not drinking.   3. CAD - to manage medically - no chest pain reported.   4. CKD - need to recheck his labs today.   I will see him back in about 2 to 3 weeks. Check BMET today. Hope he will be compliant.   Patient is agreeable to this plan and will call if any problems develop in the interim.   Rosalio Macadamia, RN, ANP-C [facility] [address] [place], [place]  [address]

## 2013-05-12 NOTE — Patient Instructions (Addendum)
Keep weighing each morning  I am adding back Lisinopril 5 mg a day and Lasix 20 mg daily each morning - these are at the drug store  We need to check labs today  I will see you in 2 to 3 weeks  We will try to get you a primary doctor  Call the [facility] office at [phone] if you have any questions, problems or concerns.

## 2013-05-15 ENCOUNTER — Encounter: Payer: Self-pay | Admitting: *Deleted

## 2013-05-15 ENCOUNTER — Encounter (HOSPITAL_COMMUNITY): Payer: Self-pay | Admitting: *Deleted

## 2013-05-15 ENCOUNTER — Emergency Department (HOSPITAL_COMMUNITY)
Admission: EM | Admit: 2013-05-15 | Discharge: 2013-05-15 | Disposition: A | Discharge status: Home / Self Care | Payer: Medicaid Other | Attending: Emergency Medicine | Admitting: Emergency Medicine

## 2013-05-15 DIAGNOSIS — Z7982 Long term (current) use of aspirin: Secondary | ICD-10-CM | POA: Insufficient documentation

## 2013-05-15 DIAGNOSIS — Z8611 Personal history of tuberculosis: Secondary | ICD-10-CM | POA: Insufficient documentation

## 2013-05-15 DIAGNOSIS — Z8669 Personal history of other diseases of the nervous system and sense organs: Secondary | ICD-10-CM | POA: Insufficient documentation

## 2013-05-15 DIAGNOSIS — Z8701 Personal history of pneumonia (recurrent): Secondary | ICD-10-CM | POA: Insufficient documentation

## 2013-05-15 DIAGNOSIS — Z87891 Personal history of nicotine dependence: Secondary | ICD-10-CM | POA: Insufficient documentation

## 2013-05-15 DIAGNOSIS — Z85118 Personal history of other malignant neoplasm of bronchus and lung: Secondary | ICD-10-CM | POA: Insufficient documentation

## 2013-05-15 DIAGNOSIS — I10 Essential (primary) hypertension: Secondary | ICD-10-CM | POA: Insufficient documentation

## 2013-05-15 DIAGNOSIS — Z8659 Personal history of other mental and behavioral disorders: Secondary | ICD-10-CM | POA: Insufficient documentation

## 2013-05-15 DIAGNOSIS — Z79899 Other long term (current) drug therapy: Secondary | ICD-10-CM | POA: Insufficient documentation

## 2013-05-15 DIAGNOSIS — R350 Frequency of micturition: Secondary | ICD-10-CM | POA: Insufficient documentation

## 2013-05-15 DIAGNOSIS — Z8719 Personal history of other diseases of the digestive system: Secondary | ICD-10-CM | POA: Insufficient documentation

## 2013-05-15 DIAGNOSIS — Z8673 Personal history of transient ischemic attack (TIA), and cerebral infarction without residual deficits: Secondary | ICD-10-CM | POA: Insufficient documentation

## 2013-05-15 DIAGNOSIS — F411 Generalized anxiety disorder: Secondary | ICD-10-CM | POA: Insufficient documentation

## 2013-05-15 DIAGNOSIS — I2510 Atherosclerotic heart disease of native coronary artery without angina pectoris: Secondary | ICD-10-CM | POA: Insufficient documentation

## 2013-05-15 LAB — URINALYSIS, ROUTINE W REFLEX MICROSCOPIC
Bilirubin Urine: NEGATIVE
Hgb urine dipstick: NEGATIVE
Nitrite: NEGATIVE
Protein, ur: NEGATIVE mg/dL
Urobilinogen, UA: 1 mg/dL (ref 0.0–1.0)

## 2013-05-15 NOTE — ED Provider Notes (Signed)
History    CSN: [id number] Arrival date & time 05/15/13  1026  First MD Initiated Contact with Patient 05/15/13 1134     Chief Complaint  Patient presents with  . Urinary Frequency   (Consider location/radiation/quality/duration/timing/severity/associated sxs/prior Treatment) HPI  [name] is a 62 year old male with multiple health problems who presents today with an episode of feeling like he needed to void but being unable to for one hour. This occurred after receiving his methadone this morning. The symptoms have since resulted. He has had episodes of urinary retention in the past. He did not have any burning with urination. He had to void 4 times an hour but was only able to void small amounts and then was able to empty his bladder. Upon arrival here he does not have any symptoms. He states he had to wait to give [place] a urine sample here. He has been eating and drinking as usual. He has not had problems with bowel movements. He has had no pain in his lower abdomen or rectal area. He has not had fever or chills. He has not had a history of prostatitis. He does not have a history of diabetes or hyperglycemia. At this time the patient feels that his symptoms have all resolved. Past Medical History  Diagnosis Date  . Essential hypertension, benign 02/26/2013  . Liver lesion     a. Previously biopsy-proven to be a cyst (per records from a hospital in [place]).  . Hypoxia     a. Adm 02/2013, required home O2.  . Drug abuse   . Depression   . Seizure disorder     "after hip OR 02/2012 once I got home" (04/01/2013)  . GI bleed     a. 2009 at time of stroke - received 3 u blood, no source found per pt  . HTN (hypertension)   . On home oxygen therapy     2.5L "usually use it at night" (04/01/2013)  . Tuberculosis   . Positive TB test   . History of blood transfusion 2009    "don't know what it was related to" (04/01/2013)  . Stroke 2009    a. Pt reports several, last in 2009. b. Residual L  sided weakness.  . Anxiety   . Adenocarcinoma, lung     a. Dx 2013, underwent left lower lobectomy under the care of Dr.Lapunzina in [place] for a stage I non-small cell lung CA.; "went to [facility] ~ 06/2012 and they couldn't find anything" (04/01/2013)  . Pneumonia   . Coronary atherosclerosis of native coronary artery    Past Surgical History  Procedure Laterality Date  . Hip arthroplasty Left 02/2012  . Lung biopsy Left 07/15/2012    "LLL" (04/01/2013)  . Lung removal, partial Left     /notes 02/26/2013  (04/01/2013)   Family History  Problem Relation Age of Onset  . Stroke    . CAD Neg Hx    History  Substance Use Topics  . Smoking status: Former Smoker -- 0.50 packs/day for 42 years    Types: Cigarettes    Quit date: 02/27/2012  . Smokeless tobacco: Never Used  . Alcohol Use: 29.4 oz/week    14 Cans of beer, 35 Shots of liquor per week     Comment: 04/01/2013 "~ 1/2 pint /day of rum plus 2 16oz beers/day"    Review of Systems  All other systems reviewed and are negative.    Allergies  Review of patient's allergies indicates  no known allergies.  Home Medications   Current Outpatient Rx  Name  Route  Sig  Dispense  Refill  . aspirin EC 81 MG EC tablet   Oral   Take 1 tablet (81 mg total) by mouth daily.         . carvedilol (COREG) 25 MG tablet   Oral   Take 1 tablet (25 mg total) by mouth 2 (two) times daily with a meal.   60 tablet   1   . cloNIDine (CATAPRES) 0.1 MG tablet   Oral   Take 1 tablet (0.1 mg total) by mouth 2 (two) times daily.   60 tablet   1   . docusate sodium 100 MG CAPS   Oral   Take 100 mg by mouth 2 (two) times daily.   10 capsule      . folic acid (FOLVITE) 1 MG tablet   Oral   Take 1 tablet (1 mg total) by mouth daily.         . furosemide (LASIX) 20 MG tablet   Oral   Take 1 tablet (20 mg total) by mouth daily.   30 tablet   3   . hydrALAZINE (APRESOLINE) 50 MG tablet   Oral   Take 1 tablet (50 mg total) by mouth every  8 (eight) hours.   90 tablet   1   . lisinopril (PRINIVIL,ZESTRIL) 5 MG tablet   Oral   Take 1 tablet (5 mg total) by mouth daily.   30 tablet   3   . METHADONE HCL PO   Oral   Take 29 mg by mouth daily. As directed         . Multiple Vitamin (MULTIVITAMIN WITH MINERALS) TABS   Oral   Take 1 tablet by mouth daily.          BP 149/67  Pulse 60  Temp(Src) 98 F (36.7 C) (Oral)  Resp 19  SpO2 94% Physical Exam  Nursing note and vitals reviewed. Constitutional: He is oriented to person, place, and time. He appears well-developed and well-nourished.  HENT:  Head: Normocephalic and atraumatic.  Right Ear: External ear normal.  Left Ear: External ear normal.  Nose: Nose normal.  Mouth/Throat: Oropharynx is clear and moist.  Eyes: Conjunctivae and EOM are normal. Pupils are equal, round, and reactive to light.  Neck: Normal range of motion. Neck supple.  Cardiovascular: Normal rate, regular rhythm, normal heart sounds and intact distal pulses.   Pulmonary/Chest: Effort normal and breath sounds normal. No respiratory distress. He has no wheezes. He exhibits no tenderness.  Abdominal: Soft. Bowel sounds are normal. He exhibits no distension and no mass. There is no tenderness. There is no guarding.  Abdomen is soft there is no tenderness and no distention is noted.  Genitourinary: Penis normal.  Genital exam reveals normal genitalia with no penile discharge. Testicles are palpated and normal bilaterally. There is no inguinal swelling.  Musculoskeletal: Normal range of motion.  Patient has some mild weakness of his left leg and left upper extremity.  Neurological: He is alert and oriented to person, place, and time. He has normal reflexes. He exhibits normal muscle tone. Coordination normal.  Skin: Skin is warm and dry.  Psychiatric: He has a normal mood and affect. His behavior is normal. Judgment and thought content normal.    ED Course  Procedures (including critical  care time) Labs Reviewed  URINALYSIS, ROUTINE W REFLEX MICROSCOPIC   Results for orders placed during  the hospital encounter of 05/15/13  URINALYSIS, ROUTINE W REFLEX MICROSCOPIC      Result Value Range   Color, Urine YELLOW  YELLOW   APPearance CLEAR  CLEAR   Specific Gravity, Urine 1.008  1.005 - 1.030   pH 6.5  5.0 - 8.0   Glucose, UA NEGATIVE  NEGATIVE mg/dL   Hgb urine dipstick NEGATIVE  NEGATIVE   Bilirubin Urine NEGATIVE  NEGATIVE   Ketones, ur NEGATIVE  NEGATIVE mg/dL   Protein, ur NEGATIVE  NEGATIVE mg/dL   Urobilinogen, UA 1.0  0.0 - 1.0 mg/dL   Nitrite NEGATIVE  NEGATIVE   Leukocytes, UA NEGATIVE  NEGATIVE    No results found. No diagnosis found.  MDM  Patient has a normal urinalysis. From his history it sounds like he has had some episodes of retention in the past. He does not appear to have urinary retention at this time. From his history it sounds like he may have had some urinary retention that lasted about an hour but then was able to void. I have discussed with the patient that there is no further apparent need for testing or treatment at this time. He feels his normal self. He is advised return if he has return of symptoms or is worsening with symptoms such as pain or fever. Otherwise he is advised to continue his usual medications and treatment plan.  Hilario Quarry, MD 05/15/13 [phone]

## 2013-05-15 NOTE — ED Notes (Signed)
Pt reports being dosed for methadone and took med this am and having urinary frequency since 0730 this am.

## 2013-05-21 ENCOUNTER — Other Ambulatory Visit: Payer: Self-pay

## 2013-05-21 ENCOUNTER — Telehealth: Payer: Self-pay | Admitting: Nurse Practitioner

## 2013-05-21 DIAGNOSIS — I10 Essential (primary) hypertension: Secondary | ICD-10-CM

## 2013-05-21 MED ORDER — POTASSIUM CHLORIDE CRYS ER 20 MEQ PO TBCR: 20.0000 meq | EXTENDED_RELEASE_TABLET | Freq: Every day | ORAL | Status: DC

## 2013-05-21 NOTE — Telephone Encounter (Signed)
Received call from patient lab results given.Start KDur 20 meq daily.Repeat bmet to be done at office visit with Norma Fredrickson NP 06/01/13.

## 2013-05-21 NOTE — Telephone Encounter (Signed)
New problem   Pt stated he received a letter from [place] and was told to call back to for test results. Please call pt

## 2013-06-01 ENCOUNTER — Other Ambulatory Visit: Payer: Self-pay

## 2013-06-01 ENCOUNTER — Ambulatory Visit (INDEPENDENT_AMBULATORY_CARE_PROVIDER_SITE_OTHER): Payer: Self-pay | Admitting: Nurse Practitioner

## 2013-06-01 ENCOUNTER — Encounter: Payer: Self-pay | Admitting: Nurse Practitioner

## 2013-06-01 VITALS — BP 158/50 | HR 60 | Ht 71.5 in | Wt 147.8 lb

## 2013-06-01 DIAGNOSIS — I5022 Chronic systolic (congestive) heart failure: Secondary | ICD-10-CM

## 2013-06-01 DIAGNOSIS — I10 Essential (primary) hypertension: Secondary | ICD-10-CM

## 2013-06-01 LAB — CBC WITH DIFFERENTIAL/PLATELET
Basophils Absolute: 0 10*3/uL (ref 0.0–0.1)
Basophils Relative: 0.1 % (ref 0.0–3.0)
Eosinophils Absolute: 0.8 10*3/uL — ABNORMAL HIGH (ref 0.0–0.7)
Eosinophils Relative: 12.2 % — ABNORMAL HIGH (ref 0.0–5.0)
HCT: 27.6 % — ABNORMAL LOW (ref 39.0–52.0)
Hemoglobin: 8.4 g/dL — ABNORMAL LOW (ref 13.0–17.0)
Lymphocytes Relative: 35.6 % (ref 12.0–46.0)
Lymphs Abs: 2.3 10*3/uL (ref 0.7–4.0)
MCHC: 30.3 g/dL (ref 30.0–36.0)
MCV: 72.1 fl — ABNORMAL LOW (ref 78.0–100.0)
Monocytes Absolute: 0.7 10*3/uL (ref 0.1–1.0)
Monocytes Relative: 10.8 % (ref 3.0–12.0)
Neutro Abs: 2.7 10*3/uL (ref 1.4–7.7)
Neutrophils Relative %: 41.3 % — ABNORMAL LOW (ref 43.0–77.0)
Platelets: 302 10*3/uL (ref 150.0–400.0)
RBC: 3.84 Mil/uL — ABNORMAL LOW (ref 4.22–5.81)
RDW: 17 % — ABNORMAL HIGH (ref 11.5–14.6)
WBC: 6.4 10*3/uL (ref 4.5–10.5)

## 2013-06-01 LAB — BASIC METABOLIC PANEL
BUN: 13 mg/dL (ref 6–23)
CO2: 31 mEq/L (ref 19–32)
Calcium: 9 mg/dL (ref 8.4–10.5)
Chloride: 100 mEq/L (ref 96–112)
Creatinine, Ser: 1.5 mg/dL (ref 0.4–1.5)
GFR: 60.78 mL/min (ref >60.00)
Glucose, Bld: 124 mg/dL — ABNORMAL HIGH (ref 70–99)
Potassium: 3.4 mEq/L — ABNORMAL LOW (ref 3.5–5.1)
Sodium: 139 mEq/L (ref 135–145)

## 2013-06-01 LAB — BRAIN NATRIURETIC PEPTIDE: Pro B Natriuretic peptide (BNP): 226 pg/mL — ABNORMAL HIGH (ref 0.0–100.0)

## 2013-06-01 MED ORDER — CLONIDINE HCL 0.1 MG PO TABS: 0.1000 mg | ORAL_TABLET | Freq: Every day | ORAL | Status: DC

## 2013-06-01 NOTE — Patient Instructions (Addendum)
We have to recheck your labs today  I am cutting the clonidine back to just one a day  Stay on all your other medicines  See Dr. Eden Emms in 4 to 6 weeks  Keep away from the salt  Call the [facility] office at [phone] if you have any questions, problems or concerns.

## 2013-06-01 NOTE — Progress Notes (Signed)
Mark [name] Date of Birth: 1951/10/18 Medical Record #[id number]  History of Present Illness: Mark [name] is seen back today for a 3 week check. Seen for Mark [name]. Just moved to [place] about 4 months ago. He has a history of HTN, CVA, prior drug abuse (cocaine/heroin, currently on Methadone), remote seizures, ETOH abuse, remote GI bleed, HTN, depression, anemia of chronic disease and non small cell lung cancer with left lower lobectomy while living in [place] - now followed by Mark [name].   Recently admitted with progressive SOB and weight gain - echo showed EF down to 35 to 40% - in the setting of increased salt use and excessive alcohol use. The chronicity of the low EF was unclear. 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 He had worsening renal function post cath and his Lasix and ACE were held at discharge.   I saw him 5 weeks post discharge. He was doing ok. I added back Lisinopril and Lasix. Kidney function had improved. Tried to get his a primary care doctor - this has yet to happen and will be quite difficult.   Comes back today. Here with his son in law who has been helping take care of him. Says he is ok for the most part. Biggest complaint is dizziness, especially with position changes. Not short of breath. Weight is down 4 pounds. No swelling. No cough. Just dizzy. No reports of bleeding. Blood count was down when I last saw him. Says he is not drinking - but I was not convinced. Remains on methadone as well.    Current Outpatient Prescriptions  Medication Sig Dispense Refill  . aspirin EC 81 MG EC tablet Take 1 tablet (81 mg total) by mouth daily.      . carvedilol (COREG) 25 MG tablet Take 1 tablet (25 mg total) by mouth 2 (two) times daily with a meal.  60 tablet  1  . cloNIDine (CATAPRES) 0.1 MG tablet Take 1 tablet (0.1 mg total) by mouth daily.  60 tablet  1  . docusate sodium 100 MG CAPS Take 100 mg by mouth 2 (two) times daily.  10 capsule    . folic  acid (FOLVITE) 1 MG tablet Take 1 tablet (1 mg total) by mouth daily.      . furosemide (LASIX) 20 MG tablet Take 1 tablet (20 mg total) by mouth daily.  30 tablet  3  . hydrALAZINE (APRESOLINE) 50 MG tablet Take 1 tablet (50 mg total) by mouth every 8 (eight) hours.  90 tablet  1  . lisinopril (PRINIVIL,ZESTRIL) 5 MG tablet Take 1 tablet (5 mg total) by mouth daily.  30 tablet  3  . METHADONE HCL PO Take 29 mg by mouth daily. As directed      . Multiple Vitamin (MULTIVITAMIN WITH MINERALS) TABS Take 1 tablet by mouth daily.      . potassium chloride SA (K-DUR,KLOR-CON) 20 MEQ tablet Take 1 tablet (20 mEq total) by mouth daily.  30 tablet  6   No current facility-administered medications for this visit.    No Known Allergies  Past Medical History  Diagnosis Date  . Essential hypertension, benign 02/26/2013  . Liver lesion     a. Previously biopsy-proven to be a cyst (per records from a hospital in [place]).  . Hypoxia     a. Adm 02/2013, required home O2.  . Drug abuse   . Depression   . Seizure disorder     "after  hip OR 02/2012 once I got home" (04/01/2013)  . GI bleed     a. 2009 at time of stroke - received 3 u blood, no source found per pt  . HTN (hypertension)   . On home oxygen therapy     2.5L "usually use it at night" (04/01/2013)  . Tuberculosis   . Positive TB test   . History of blood transfusion 2009    "don't know what it was related to" (04/01/2013)  . Stroke 2009    a. Pt reports several, last in 2009. b. Residual L sided weakness.  . Anxiety   . Adenocarcinoma, lung     a. Dx 2013, underwent left lower lobectomy under the care of MarkLapunzina in [place] for a stage I non-small cell lung CA.; "went to [facility] ~ 06/2012 and they couldn't find anything" (04/01/2013)  . Pneumonia   . Coronary atherosclerosis of native coronary artery     Past Surgical History  Procedure Laterality Date  . Hip arthroplasty Left 02/2012  . Lung biopsy Left 07/15/2012    "LLL" (04/01/2013)    . Lung removal, partial Left     /notes 02/26/2013  (04/01/2013)    History  Smoking status  . Former Smoker -- 0.50 packs/day for 42 years  . Types: Cigarettes  . Quit date: 02/27/2012  Smokeless tobacco  . Never Used    History  Alcohol Use  . 29.4 oz/week  . 14 Cans of beer, 35 Shots of liquor per week    Comment: 04/01/2013 "~ 1/2 pint /day of rum plus 2 16oz beers/day"    Family History  Problem Relation Age of Onset  . Stroke    . CAD Neg Hx     Review of Systems: The review of systems is per the HPI.  All other systems were reviewed and are negative.  Physical Exam: BP 158/50  Pulse 60  Ht 5' 11.5" (1.816 m)  Wt 147 lb 12.8 oz (67.042 kg)  BMI 20.33 kg/m2 BP by me is 120/70 Patient is alert and in no acute distress. In a wheelchair. Chronically ill. Weight is down. Skin is warm and dry. Color is normal.  HEENT is unremarkable. Normocephalic/atraumatic. PERRL. Sclera are nonicteric. Neck is supple. No masses. No JVD. Lungs are clear. Cardiac exam shows a regular rate and rhythm. Abdomen is soft. Extremities are without edema. Gait and ROM are intact. No gross neurologic deficits noted.  LABORATORY DATA: PENDING  Lab Results  Component Value Date   WBC 9.8 05/12/2013   HGB 8.2 Repeated and verified X2.* 05/12/2013   HCT 26.8 Repeated and verified X2.* 05/12/2013   PLT 331.0 05/12/2013   GLUCOSE 102* 05/12/2013   CHOL 139 04/02/2013   TRIG 140 04/02/2013   HDL 60 04/02/2013   LDLCALC 51 04/02/2013   ALT 11 04/01/2013   AST 23 04/01/2013   NA 139 05/12/2013   K 3.4* 05/12/2013   CL 99 05/12/2013   CREATININE 1.2 05/12/2013   BUN 11 05/12/2013   CO2 32 05/12/2013   TSH 2.066 04/01/2013   INR 1.06 04/03/2013   HGBA1C 5.5 04/02/2013     Assessment / Plan: 1. 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 Back on ACE and diuretic. Complaint of dizziness - I think this will prevent [place] from trying to titrate medicines further. Rechecking labs today as well.  2. Anemia  - rechecking today. No obvious signs of bleeding - he tells me he  has had prior transfusion in the past - not sure who we are going to be able to get him to without insurance/Medicaid. His son in law is trying to get the paper work for [facility] - this is going to be our limiting factor.   3. Alcohol abuse - I was not convinced that he is not drinking. Abstinence is encouraged.   4. CAD - to manage medically - no symptoms reported.   5. CKD - recheck labs today.   6. Past lung cancer - not seeing oncology anymore.   7. Dizziness - I suspect this is multifactorial - especially given his has substance abuse issues.   Patient is agreeable to this plan and will call if any problems develop in the interim.   Rosalio Macadamia, RN, ANP-C [facility] [address] [place], [place]  [address]

## 2013-06-02 ENCOUNTER — Other Ambulatory Visit: Payer: Self-pay | Admitting: Nurse Practitioner

## 2013-06-02 ENCOUNTER — Emergency Department (HOSPITAL_COMMUNITY)
Admission: EM | Admit: 2013-06-02 | Discharge: 2013-06-02 | Disposition: A | Discharge status: Home / Self Care | Payer: Medicaid Other | Attending: Emergency Medicine | Admitting: Emergency Medicine

## 2013-06-02 ENCOUNTER — Telehealth: Payer: Self-pay | Admitting: *Deleted

## 2013-06-02 ENCOUNTER — Encounter (HOSPITAL_COMMUNITY): Payer: Self-pay | Admitting: *Deleted

## 2013-06-02 DIAGNOSIS — R42 Dizziness and giddiness: Secondary | ICD-10-CM

## 2013-06-02 DIAGNOSIS — Z85118 Personal history of other malignant neoplasm of bronchus and lung: Secondary | ICD-10-CM | POA: Insufficient documentation

## 2013-06-02 DIAGNOSIS — Z9981 Dependence on supplemental oxygen: Secondary | ICD-10-CM | POA: Insufficient documentation

## 2013-06-02 DIAGNOSIS — Z87891 Personal history of nicotine dependence: Secondary | ICD-10-CM | POA: Insufficient documentation

## 2013-06-02 DIAGNOSIS — G40909 Epilepsy, unspecified, not intractable, without status epilepticus: Secondary | ICD-10-CM | POA: Insufficient documentation

## 2013-06-02 DIAGNOSIS — D649 Anemia, unspecified: Secondary | ICD-10-CM | POA: Diagnosis present

## 2013-06-02 DIAGNOSIS — Z8673 Personal history of transient ischemic attack (TIA), and cerebral infarction without residual deficits: Secondary | ICD-10-CM | POA: Insufficient documentation

## 2013-06-02 DIAGNOSIS — Z8719 Personal history of other diseases of the digestive system: Secondary | ICD-10-CM | POA: Insufficient documentation

## 2013-06-02 DIAGNOSIS — Z8611 Personal history of tuberculosis: Secondary | ICD-10-CM | POA: Insufficient documentation

## 2013-06-02 DIAGNOSIS — F1910 Other psychoactive substance abuse, uncomplicated: Secondary | ICD-10-CM | POA: Insufficient documentation

## 2013-06-02 DIAGNOSIS — I2510 Atherosclerotic heart disease of native coronary artery without angina pectoris: Secondary | ICD-10-CM | POA: Insufficient documentation

## 2013-06-02 DIAGNOSIS — Z8659 Personal history of other mental and behavioral disorders: Secondary | ICD-10-CM | POA: Insufficient documentation

## 2013-06-02 DIAGNOSIS — Z8701 Personal history of pneumonia (recurrent): Secondary | ICD-10-CM | POA: Insufficient documentation

## 2013-06-02 DIAGNOSIS — Z7982 Long term (current) use of aspirin: Secondary | ICD-10-CM | POA: Insufficient documentation

## 2013-06-02 DIAGNOSIS — I10 Essential (primary) hypertension: Secondary | ICD-10-CM | POA: Insufficient documentation

## 2013-06-02 DIAGNOSIS — Z79899 Other long term (current) drug therapy: Secondary | ICD-10-CM | POA: Insufficient documentation

## 2013-06-02 LAB — TYPE AND SCREEN: Antibody Screen: NEGATIVE

## 2013-06-02 LAB — CBC
Hemoglobin: 8.9 g/dL — ABNORMAL LOW (ref 13.0–17.0)
MCHC: 29.8 g/dL — ABNORMAL LOW (ref 30.0–36.0)
RBC: 4.07 MIL/uL — ABNORMAL LOW (ref 4.22–5.81)
WBC: 5.9 10*3/uL (ref 4.0–10.5)

## 2013-06-02 LAB — BASIC METABOLIC PANEL
CO2: 30 mEq/L (ref 19–32)
GFR calc non Af Amer: 53 mL/min — ABNORMAL LOW (ref >90)
Glucose, Bld: 129 mg/dL — ABNORMAL HIGH (ref 70–99)
Potassium: 3.6 mEq/L (ref 3.5–5.1)
Sodium: 138 mEq/L (ref 135–145)

## 2013-06-02 NOTE — ED Notes (Signed)
Pt saw MD yesterday to follow up and was told that he has low hemoglobin and needs 2 units of PRBCs.  PT reports weakness, hard to swallow, and dry mouth

## 2013-06-02 NOTE — Telephone Encounter (Signed)
S/w pts son in law he stated he was at the front of [facility] where does pt need to go I stated pt needs to get a PCP not go to the hospital son in law stated where do I go to get a PCP I asked to hold to go get a scheduler to help me we discussed where pt should go I picked up the phone and son in law hung phone up

## 2013-06-02 NOTE — ED Provider Notes (Signed)
CSN: [id number]     Arrival date & time 06/02/13  1517 History     First MD Initiated Contact with Patient 06/02/13 1628     Chief Complaint  Patient presents with  . Low hemoglobin    (Consider location/radiation/quality/duration/timing/severity/associated sxs/prior Treatment) Patient is a 62 y.o. male presenting with neurologic complaint. The history is provided by the patient.  Neurologic Problem This is a new problem. Episode onset: 2-3 days ago. The problem occurs daily. The problem has been resolved. Pertinent negatives include no chest pain, no abdominal pain, no headaches and no shortness of breath. The symptoms are aggravated by walking. The symptoms are relieved by rest. He has tried nothing for the symptoms. The treatment provided significant relief.    Past Medical History  Diagnosis Date  . Essential hypertension, benign 02/26/2013  . Liver lesion     a. Previously biopsy-proven to be a cyst (per records from a hospital in [place]).  . Hypoxia     a. Adm 02/2013, required home O2.  . Drug abuse   . Depression   . Seizure disorder     "after hip OR 02/2012 once I got home" (04/01/2013)  . GI bleed     a. 2009 at time of stroke - received 3 u blood, no source found per pt  . HTN (hypertension)   . On home oxygen therapy     2.5L "usually use it at night" (04/01/2013)  . Tuberculosis   . Positive TB test   . History of blood transfusion 2009    "don't know what it was related to" (04/01/2013)  . Stroke 2009    a. Pt reports several, last in 2009. b. Residual L sided weakness.  . Anxiety   . Adenocarcinoma, lung     a. Dx 2013, underwent left lower lobectomy under the care of Dr.Lapunzina in [place] for a stage I non-small cell lung CA.; "went to [facility] ~ 06/2012 and they couldn't find anything" (04/01/2013)  . Pneumonia   . Coronary atherosclerosis of native coronary artery    Past Surgical History  Procedure Laterality Date  . Hip arthroplasty Left 02/2012  . Lung  biopsy Left 07/15/2012    "LLL" (04/01/2013)  . Lung removal, partial Left     /notes 02/26/2013  (04/01/2013)   Family History  Problem Relation Age of Onset  . Stroke    . CAD Neg Hx    History  Substance Use Topics  . Smoking status: Former Smoker -- 0.50 packs/day for 42 years    Types: Cigarettes    Quit date: 02/27/2012  . Smokeless tobacco: Never Used  . Alcohol Use: 29.4 oz/week    14 Cans of beer, 35 Shots of liquor per week     Comment: 04/01/2013 "~ 1/2 pint /day of rum plus 2 16oz beers/day"    Review of Systems  Constitutional: Negative for fever.  HENT: Negative for rhinorrhea, drooling and neck pain.   Eyes: Negative for pain.  Respiratory: Negative for cough and shortness of breath.   Cardiovascular: Negative for chest pain and leg swelling.  Gastrointestinal: Negative for nausea, vomiting, abdominal pain and diarrhea.  Genitourinary: Negative for dysuria and hematuria.  Musculoskeletal: Negative for gait problem.  Skin: Negative for color change.  Neurological: Negative for numbness and headaches.  Hematological: Negative for adenopathy.  Psychiatric/Behavioral: Negative for behavioral problems.  All other systems reviewed and are negative.    Allergies  Review of patient's allergies indicates no known  allergies.  Home Medications   Current Outpatient Rx  Name  Route  Sig  Dispense  Refill  . aspirin EC 81 MG EC tablet   Oral   Take 1 tablet (81 mg total) by mouth daily.         . carvedilol (COREG) 25 MG tablet   Oral   Take 1 tablet (25 mg total) by mouth 2 (two) times daily with a meal.   60 tablet   1   . cloNIDine (CATAPRES) 0.1 MG tablet   Oral   Take 1 tablet (0.1 mg total) by mouth daily.   60 tablet   1   . docusate sodium 100 MG CAPS   Oral   Take 100 mg by mouth 2 (two) times daily.   10 capsule      . folic acid (FOLVITE) 1 MG tablet   Oral   Take 1 tablet (1 mg total) by mouth daily.         . furosemide (LASIX) 20 MG  tablet   Oral   Take 1 tablet (20 mg total) by mouth daily.   30 tablet   3   . hydrALAZINE (APRESOLINE) 50 MG tablet   Oral   Take 1 tablet (50 mg total) by mouth every 8 (eight) hours.   90 tablet   1   . lisinopril (PRINIVIL,ZESTRIL) 5 MG tablet   Oral   Take 1 tablet (5 mg total) by mouth daily.   30 tablet   3   . METHADONE HCL PO   Oral   Take 40 mg by mouth daily. As directed         . Multiple Vitamin (MULTIVITAMIN WITH MINERALS) TABS   Oral   Take 1 tablet by mouth daily.         . naproxen sodium (ANAPROX) 220 MG tablet   Oral   Take 440 mg by mouth daily as needed (for pain).         . potassium chloride SA (K-DUR,KLOR-CON) 20 MEQ tablet   Oral   Take 1 tablet (20 mEq total) by mouth daily.   30 tablet   6    BP 179/90  Pulse 62  Temp(Src) 98.9 F (37.2 C) (Oral)  Resp 18  SpO2 97% Physical Exam  Nursing note and vitals reviewed. Constitutional: He is oriented to person, place, and time. He appears well-developed and well-nourished.  HENT:  Head: Normocephalic and atraumatic.  Right Ear: External ear normal.  Left Ear: External ear normal.  Nose: Nose normal.  Mouth/Throat: Oropharynx is clear and moist. No oropharyngeal exudate.  Eyes: Conjunctivae and EOM are normal. Pupils are equal, round, and reactive to light.  Neck: Normal range of motion. Neck supple.  Cardiovascular: Normal rate, regular rhythm, normal heart sounds and intact distal pulses.  Exam reveals no gallop and no friction rub.   No murmur heard. Pulmonary/Chest: Effort normal and breath sounds normal. No respiratory distress. He has no wheezes.  Abdominal: Soft. Bowel sounds are normal. He exhibits no distension. There is no tenderness. There is no rebound and no guarding.  Musculoskeletal: Normal range of motion. He exhibits no edema and no tenderness.  Neurological: He is alert and oriented to person, place, and time. He has normal strength. No cranial nerve deficit or  sensory deficit. Coordination and gait normal.  Pt ambulating w/ cane at his baseline per him.   Skin: Skin is warm and dry.  Psychiatric: He has a normal mood  and affect. His behavior is normal.    ED Course   Procedures (including critical care time)  Labs Reviewed  CBC - Abnormal; Notable for the following:    RBC 4.07 (*)    Hemoglobin 8.9 (*)    HCT 29.9 (*)    MCV 73.5 (*)    MCH 21.9 (*)    MCHC 29.8 (*)    RDW 16.5 (*)    All other components within normal limits  BASIC METABOLIC PANEL - Abnormal; Notable for the following:    Glucose, Bld 129 (*)    Creatinine, Ser 1.38 (*)    GFR calc non Af Amer 53 (*)    GFR calc Af Amer 61 (*)    All other components within normal limits  OCCULT BLOOD, POC DEVICE  TYPE AND SCREEN  ABO/RH   No results found. 1. Anemia   2. Dizziness       Date: 06/02/2013  Rate: 61  Rhythm: normal sinus rhythm  QRS Axis: normal  Intervals: normal  ST/T Wave abnormalities: normal  Conduction Disutrbances:none  Narrative Interpretation: LVH w/ repolarization abnormality  Old EKG Reviewed: unchanged   MDM  8:53 AM 62 y.o. male here w/ c/o low Hgb and states he was sent for 2 Units Prbc's per nurse that saw him yesterday. In review of notes, pt seen yesterday, has known anemia which was not significantly changed. Pt notes mild dizziness w/ ambulation for last 2-3 days, but denies sx today. Neuro exam is at pt's baseline and he is ambulatory w/out difficulty. Labs cw previous and no new findings. Will hemoccult.   8:53 AM: Heme occult neg. Pt continues to appear well.  I have discussed the diagnosis/risks/treatment options with the patient and family and believe the pt to be eligible for discharge home to follow-up with and establish with a pcp which the son in law states he found one today. We also discussed returning to the ED immediately if new or worsening sx occur. We discussed the sx which are most concerning (e.g., return of  dizziness/weakness, any cp/sob/bloody stools) that necessitate immediate return. Any new prescriptions provided to the patient are listed below.  Discharge Medication List as of 06/02/2013  5:47 PM        Randa Spike Mort Sawyers, MD 06/03/13 [phone]

## 2013-06-02 NOTE — ED Notes (Signed)
Pt discharged.Vital signs stable and GCS 15 

## 2013-06-15 ENCOUNTER — Ambulatory Visit: Payer: Self-pay

## 2013-11-06 ENCOUNTER — Emergency Department (HOSPITAL_COMMUNITY): Payer: Medicaid Other

## 2013-11-06 ENCOUNTER — Emergency Department (HOSPITAL_COMMUNITY)
Admission: EM | Admit: 2013-11-06 | Discharge: 2013-11-06 | Disposition: A | Discharge status: Home / Self Care | Payer: Medicaid Other | Attending: Emergency Medicine | Admitting: Emergency Medicine

## 2013-11-06 ENCOUNTER — Encounter (HOSPITAL_COMMUNITY): Payer: Self-pay | Admitting: Emergency Medicine

## 2013-11-06 DIAGNOSIS — F1010 Alcohol abuse, uncomplicated: Secondary | ICD-10-CM | POA: Insufficient documentation

## 2013-11-06 DIAGNOSIS — K7689 Other specified diseases of liver: Secondary | ICD-10-CM | POA: Insufficient documentation

## 2013-11-06 DIAGNOSIS — R7611 Nonspecific reaction to tuberculin skin test without active tuberculosis: Secondary | ICD-10-CM | POA: Insufficient documentation

## 2013-11-06 DIAGNOSIS — M79609 Pain in unspecified limb: Secondary | ICD-10-CM | POA: Insufficient documentation

## 2013-11-06 DIAGNOSIS — Z8673 Personal history of transient ischemic attack (TIA), and cerebral infarction without residual deficits: Secondary | ICD-10-CM | POA: Insufficient documentation

## 2013-11-06 DIAGNOSIS — R3911 Hesitancy of micturition: Secondary | ICD-10-CM | POA: Insufficient documentation

## 2013-11-06 DIAGNOSIS — F1910 Other psychoactive substance abuse, uncomplicated: Secondary | ICD-10-CM | POA: Insufficient documentation

## 2013-11-06 DIAGNOSIS — R0902 Hypoxemia: Secondary | ICD-10-CM | POA: Insufficient documentation

## 2013-11-06 DIAGNOSIS — I10 Essential (primary) hypertension: Secondary | ICD-10-CM | POA: Insufficient documentation

## 2013-11-06 DIAGNOSIS — Z9189 Other specified personal risk factors, not elsewhere classified: Secondary | ICD-10-CM | POA: Insufficient documentation

## 2013-11-06 DIAGNOSIS — I2510 Atherosclerotic heart disease of native coronary artery without angina pectoris: Secondary | ICD-10-CM | POA: Insufficient documentation

## 2013-11-06 DIAGNOSIS — Z8709 Personal history of other diseases of the respiratory system: Secondary | ICD-10-CM | POA: Insufficient documentation

## 2013-11-06 DIAGNOSIS — Z760 Encounter for issue of repeat prescription: Secondary | ICD-10-CM

## 2013-11-06 DIAGNOSIS — F17200 Nicotine dependence, unspecified, uncomplicated: Secondary | ICD-10-CM | POA: Insufficient documentation

## 2013-11-06 DIAGNOSIS — Z9981 Dependence on supplemental oxygen: Secondary | ICD-10-CM | POA: Insufficient documentation

## 2013-11-06 DIAGNOSIS — M25552 Pain in left hip: Secondary | ICD-10-CM

## 2013-11-06 DIAGNOSIS — Z8659 Personal history of other mental and behavioral disorders: Secondary | ICD-10-CM | POA: Insufficient documentation

## 2013-11-06 DIAGNOSIS — A150 Tuberculosis of lung: Secondary | ICD-10-CM | POA: Insufficient documentation

## 2013-11-06 LAB — URINE MICROSCOPIC-ADD ON

## 2013-11-06 LAB — URINALYSIS, ROUTINE W REFLEX MICROSCOPIC
Glucose, UA: NEGATIVE mg/dL
HGB URINE DIPSTICK: NEGATIVE
Ketones, ur: NEGATIVE mg/dL
Leukocytes, UA: NEGATIVE
NITRITE: NEGATIVE
PH: 6.5 (ref 5.0–8.0)
Protein, ur: 30 mg/dL — AB
SPECIFIC GRAVITY, URINE: 1.024 (ref 1.005–1.030)
UROBILINOGEN UA: 1 mg/dL (ref 0.0–1.0)

## 2013-11-06 MED ORDER — POLYETHYLENE GLYCOL 3350 17 GM/SCOOP PO POWD: 17.0000 g | Freq: Two times a day (BID) | ORAL | Status: DC

## 2013-11-06 MED ORDER — POTASSIUM CHLORIDE ER 10 MEQ PO TBCR: 10.0000 meq | EXTENDED_RELEASE_TABLET | Freq: Once | ORAL | Status: DC

## 2013-11-06 MED ORDER — LISINOPRIL 10 MG PO TABS: 5.0000 mg | ORAL_TABLET | Freq: Every day | ORAL | Status: DC

## 2013-11-06 MED ORDER — HYDRALAZINE HCL 10 MG PO TABS: 10.0000 mg | ORAL_TABLET | Freq: Three times a day (TID) | ORAL | Status: DC

## 2013-11-06 NOTE — ED Provider Notes (Signed)
CSN: [id number]     Arrival date & time 11/06/13  1323 History  This chart was scribed for non-physician practitioner, Rosario Adie, PA-C, working with Houston Siren, MD by Roe Coombs, ED Scribe. This patient was seen in room TR10C/TR10C and the patient's care was started at 3:52 PM.    Chief Complaint  Patient presents with  . multiple complaints    The history is provided by the patient. No language interpreter was used.    HPI Comments: Mark [name] is a 63 y.o. male with medical history to include HTN, adenocarcinoma, stroke, atherosclerosis who presents to the Emergency Department with multiple complaints. Patient reports persistent difficulty voiding urinating for the past couple months. He further states that when he is able to void, he has burning with urination.  Patient also has a history of left hip surgery in 02/2013 and is complaining of moderate left hip pain. He says that last month, he exacerbated his usual pain after running into a stove. He states that when he wakes up in the mornings his hip is often sore and sometimes pain improves over the course of the day. Patient says that at this time, he is not having any hip pain. Patient states that he has baseline constipation. He denies associated fever, nausea, vomiting, diarrhea.  He has also run out of his blood pressure medication and took only a 1/2 tablet today. He does not have an appointment with his PCP until February.    Past Medical History  Diagnosis Date  . Essential hypertension, benign 02/26/2013  . Liver lesion     a. Previously biopsy-proven to be a cyst (per records from a hospital in [place]).  . Hypoxia     a. Adm 02/2013, required home O2.  . Drug abuse   . Depression   . Seizure disorder     "after hip OR 02/2012 once I got home" (04/01/2013)  . GI bleed     a. 2009 at time of stroke - received 3 u blood, no source found per pt  . HTN (hypertension)   . On home oxygen therapy     2.5L "usually use it  at night" (04/01/2013)  . Tuberculosis   . Positive TB test   . History of blood transfusion 2009    "don't know what it was related to" (04/01/2013)  . Stroke 2009    a. Pt reports several, last in 2009. b. Residual L sided weakness.  . Anxiety   . Adenocarcinoma, lung     a. Dx 2013, underwent left lower lobectomy under the care of Dr.Lapunzina in [place] for a stage I non-small cell lung CA.; "went to [facility] ~ 06/2012 and they couldn't find anything" (04/01/2013)  . Pneumonia   . Coronary atherosclerosis of native coronary artery    Past Surgical History  Procedure Laterality Date  . Hip arthroplasty Left 02/2012  . Lung biopsy Left 07/15/2012    "LLL" (04/01/2013)  . Lung removal, partial Left     /notes 02/26/2013  (04/01/2013)   Family History  Problem Relation Age of Onset  . Stroke    . CAD Neg Hx    History  Substance Use Topics  . Smoking status: Former Smoker -- 0.50 packs/day for 42 years    Types: Cigarettes    Quit date: 02/27/2012  . Smokeless tobacco: Never Used  . Alcohol Use: 29.4 oz/week    14 Cans of beer, 35 Shots of liquor per week  Comment: 04/01/2013 "~ 1/2 pint /day of rum plus 2 16oz beers/day"    Review of Systems A complete 10 system review of systems was obtained and all systems are negative except as noted in the HPI and PMH.   Allergies  Review of patient's allergies indicates no known allergies.  Home Medications   Current Outpatient Rx  Name  Route  Sig  Dispense  Refill  . aspirin EC 81 MG EC tablet   Oral   Take 1 tablet (81 mg total) by mouth daily.         . folic acid (FOLVITE) 1 MG tablet   Oral   Take 1 tablet (1 mg total) by mouth daily.         Marland Kitchen METHADONE HCL PO   Oral   Take 40 mg by mouth daily. As directed          Triage Vitals: BP 146/92  Pulse 80  Temp(Src) 98.3 F (36.8 C) (Oral)  Resp 20  Ht 5\' 11"  (1.803 m)  Wt 152 lb (68.947 kg)  BMI 21.21 kg/m2  SpO2 100% Physical Exam  Nursing note and vitals  reviewed. Constitutional: He is oriented to person, place, and time. He appears well-developed and well-nourished. No distress.  HENT:  Head: Normocephalic and atraumatic.  Eyes: EOM are normal.  Neck: Neck supple. No tracheal deviation present.  Cardiovascular: Normal rate.   Pulmonary/Chest: Effort normal. No respiratory distress.  Musculoskeletal: Normal range of motion.  Left hip ROM and strength 5/5, no bony abnormality or deformity.  Patient is ambulatory  Neurological: He is alert and oriented to person, place, and time.  Skin: Skin is warm and dry.  Psychiatric: He has a normal mood and affect. His behavior is normal.    ED Course  Procedures (including critical care time) DIAGNOSTIC STUDIES: Oxygen Saturation is 100% on room air, normal by my interpretation.    COORDINATION OF CARE: 3:56 PM- Patient informed of current plan for treatment and evaluation and agrees with plan at this time.     Labs Review Labs Reviewed - No data to display Imaging Review No results found.  EKG Interpretation   None       MDM   1. Hip pain, left   2. Urinary hesitancy   3. Medication refill    Patient with multiple complaints. Left hip has chronic pain. However, he states that he bumped his symptoms other day. He is still ambulatory.  He states that he has had urinary hesitancy times several months. No evidence of urinary tract infection.  Patient requests a refill of his blood pressure medications. Patient has been on several different blood pressure medicines in the past year. I will refill the hydralazine and lisinopril. Recommend followup with PCP. This is the patient's primary complaints. States that he is not concerned about urinary hesitancy, or. He only wants to have his blood pressure medicine refill. However, will check a plain film of the hip, and a UA. Both of these are normal. Will discharge to home with PCP followup. Patient caregiver understand and agree with the  plan. He is stable and ready for discharge.  I personally performed the services described in this documentation, which was scribed in my presence. The recorded information has been reviewed and is accurate.     Montine Circle, PA-C 11/06/13 1843

## 2013-11-06 NOTE — ED Notes (Signed)
The pt is now c/o lt hip pain .  He has difficulty voiding for the past several days and he has some difficulty swallowing

## 2013-11-06 NOTE — ED Notes (Signed)
He ran out of bp med yesterday.  He only took 1/2 pill this am.  He needs a rx

## 2013-11-06 NOTE — Discharge Instructions (Signed)
Arthralgia °Your caregiver has diagnosed you as suffering from an arthralgia. Arthralgia means there is pain in a joint. This can come from many reasons including: °· Bruising the joint which causes soreness (inflammation) in the joint. °· Wear and tear on the joints which occur as we grow older (osteoarthritis). °· Overusing the joint. °· Various forms of arthritis. °· Infections of the joint. °Regardless of the cause of pain in your joint, most of these different pains respond to anti-inflammatory drugs and rest. The exception to this is when a joint is infected, and these cases are treated with antibiotics, if it is a bacterial infection. °HOME CARE INSTRUCTIONS  °· Rest the injured area for as long as directed by your caregiver. Then slowly start using the joint as directed by your caregiver and as the pain allows. Crutches as directed may be useful if the ankles, knees or hips are involved. If the knee was splinted or casted, continue use and care as directed. If an stretchy or elastic wrapping bandage has been applied today, it should be removed and re-applied every 3 to 4 hours. It should not be applied tightly, but firmly enough to keep swelling down. Watch toes and feet for swelling, bluish discoloration, coldness, numbness or excessive pain. If any of these problems (symptoms) occur, remove the ace bandage and re-apply more loosely. If these symptoms persist, contact your caregiver or return to this location. °· For the first 24 hours, keep the injured extremity elevated on pillows while lying down. °· Apply ice for 15-20 minutes to the sore joint every couple hours while awake for the first half day. Then 03-04 times per day for the first 48 hours. Put the ice in a plastic bag and place a towel between the bag of ice and your skin. °· Wear any splinting, casting, elastic bandage applications, or slings as instructed. °· Only take over-the-counter or prescription medicines for pain, discomfort, or fever as  directed by your caregiver. Do not use aspirin immediately after the injury unless instructed by your physician. Aspirin can cause increased bleeding and bruising of the tissues. °· If you were given crutches, continue to use them as instructed and do not resume weight bearing on the sore joint until instructed. °Persistent pain and inability to use the sore joint as directed for more than 2 to 3 days are warning signs indicating that you should see a caregiver for a follow-up visit as soon as possible. Initially, a hairline fracture (break in bone) may not be evident on X-rays. Persistent pain and swelling indicate that further evaluation, non-weight bearing or use of the joint (use of crutches or slings as instructed), or further X-rays are indicated. X-rays may sometimes not show a small fracture until a week or 10 days later. Make a follow-up appointment with your own caregiver or one to whom we have referred you. A radiologist (specialist in reading X-rays) may read your X-rays. Make sure you know how you are to obtain your X-ray results. Do not assume everything is normal if you do not hear from us. °SEEK MEDICAL CARE IF: °Bruising, swelling, or pain increases. °SEEK IMMEDIATE MEDICAL CARE IF:  °· Your fingers or toes are numb or blue. °· The pain is not responding to medications and continues to stay the same or get worse. °· The pain in your joint becomes severe. °· You develop a fever over 102° F (38.9° C). °· It becomes impossible to move or use the joint. °MAKE SURE YOU:  °·   Understand these instructions.  Will watch your condition.  Will get help right away if you are not doing well or get worse. Document Released: 10/15/2005 Document Revised: 01/07/2012 Document Reviewed: 06/02/2008 [facility] Patient Information 2014 [place]. Hypertension As your heart beats, it forces blood through your arteries. This force is your blood pressure. If the pressure is too high, it is called hypertension  (HTN) or high blood pressure. HTN is dangerous because you may have it and not know it. High blood pressure may mean that your heart has to work harder to pump blood. Your arteries may be narrow or stiff. The extra work puts you at risk for heart disease, stroke, and other problems.  Blood pressure consists of two numbers, a higher number over a lower, 110/72, for example. It is stated as "110 over 72." The ideal is below 120 for the top number (systolic) and under 80 for the bottom (diastolic). Write down your blood pressure today. You should pay close attention to your blood pressure if you have certain conditions such as:  Heart failure.  Prior heart attack.  Diabetes  Chronic kidney disease.  Prior stroke.  Multiple risk factors for heart disease. To see if you have HTN, your blood pressure should be measured while you are seated with your arm held at the level of the heart. It should be measured at least twice. A one-time elevated blood pressure reading (especially in the Emergency Department) does not mean that you need treatment. There may be conditions in which the blood pressure is different between your right and left arms. It is important to see your caregiver soon for a recheck. Most people have essential hypertension which means that there is not a specific cause. This type of high blood pressure may be lowered by changing lifestyle factors such as:  Stress.  Smoking.  Lack of exercise.  Excessive weight.  Drug/tobacco/alcohol use.  Eating less salt. Most people do not have symptoms from high blood pressure until it has caused damage to the body. Effective treatment can often prevent, delay or reduce that damage. TREATMENT  When a cause has been identified, treatment for high blood pressure is directed at the cause. There are a large number of medications to treat HTN. These fall into several categories, and your caregiver will help you select the medicines that are best  for you. Medications may have side effects. You should review side effects with your caregiver. If your blood pressure stays high after you have made lifestyle changes or started on medicines,   Your medication(s) may need to be changed.  Other problems may need to be addressed.  Be certain you understand your prescriptions, and know how and when to take your medicine.  Be sure to follow up with your caregiver within the time frame advised (usually within two weeks) to have your blood pressure rechecked and to review your medications.  If you are taking more than one medicine to lower your blood pressure, make sure you know how and at what times they should be taken. Taking two medicines at the same time can result in blood pressure that is too low. SEEK IMMEDIATE MEDICAL CARE IF:  You develop a severe headache, blurred or changing vision, or confusion.  You have unusual weakness or numbness, or a faint feeling.  You have severe chest or abdominal pain, vomiting, or breathing problems. MAKE SURE YOU:   Understand these instructions.  Will watch your condition.  Will get help right away if  you are not doing well or get worse. Document Released: 10/15/2005 Document Revised: 01/07/2012 Document Reviewed: 06/04/2008 [facility] Patient Information 2014 [place]. Benign Prostatic Hyperplasia An enlarged prostate (benign prostatic hyperplasia) is common in older men. You may experience the following:  Weak urine stream.  Dribbling.  Feeling like the bladder has not emptied completely.  Difficulty starting urination.  Getting up frequently at night to urinate.  Urinating more frequently during the day. HOME CARE INSTRUCTIONS  Monitor your prostatic hyperplasia for any changes. The following actions may help to alleviate any discomfort you are experiencing:  Give yourself time when you urinate.  Stay away from alcohol.  Avoid beverages containing caffeine, such as coffee,  tea, and colas, because they can make the problem worse.  Avoid decongestants, antihistamines, and some prescription medicines that can make the problem worse.  Follow up with your health care provider for further treatment as recommended. SEEK MEDICAL CARE IF:  You are experiencing progressive difficulty voiding.  Your urine stream is progressively getting narrower.  You are awaking from sleep with the urge to void more frequently.  You are constantly feeling the need to void.  You experience loss of urine, especially in small amounts. SEEK IMMEDIATE MEDICAL CARE IF:   You develop increased pain with urination or are unable to urinate.  You develop severe abdominal pain, vomiting, a high fever, or fainting.  You develop back pain or blood in your urine. MAKE SURE YOU:   Understand these instructions.  Will watch your condition.  Will get help right away if you are not doing well or get worse. Document Released: 10/15/2005 Document Revised: 06/17/2013 Document Reviewed: 03/17/2013 [facility] Patient Information 2014 [place].

## 2013-11-07 NOTE — ED Provider Notes (Signed)
Medical screening examination/treatment/procedure(s) were performed by non-physician practitioner and as supervising physician I was immediately available for consultation/collaboration.  EKG Interpretation   None         Houston Siren, MD 11/07/13 1348

## 2013-11-18 ENCOUNTER — Encounter (HOSPITAL_COMMUNITY): Payer: Self-pay | Admitting: Emergency Medicine

## 2013-11-18 ENCOUNTER — Emergency Department (HOSPITAL_COMMUNITY): Payer: Medicaid Other

## 2013-11-18 DIAGNOSIS — Z85118 Personal history of other malignant neoplasm of bronchus and lung: Secondary | ICD-10-CM | POA: Insufficient documentation

## 2013-11-18 DIAGNOSIS — J159 Unspecified bacterial pneumonia: Secondary | ICD-10-CM | POA: Insufficient documentation

## 2013-11-18 DIAGNOSIS — I10 Essential (primary) hypertension: Secondary | ICD-10-CM | POA: Insufficient documentation

## 2013-11-18 DIAGNOSIS — Z8719 Personal history of other diseases of the digestive system: Secondary | ICD-10-CM | POA: Insufficient documentation

## 2013-11-18 DIAGNOSIS — Z7982 Long term (current) use of aspirin: Secondary | ICD-10-CM | POA: Insufficient documentation

## 2013-11-18 DIAGNOSIS — Z8669 Personal history of other diseases of the nervous system and sense organs: Secondary | ICD-10-CM | POA: Insufficient documentation

## 2013-11-18 DIAGNOSIS — Z79899 Other long term (current) drug therapy: Secondary | ICD-10-CM | POA: Insufficient documentation

## 2013-11-18 DIAGNOSIS — Z8673 Personal history of transient ischemic attack (TIA), and cerebral infarction without residual deficits: Secondary | ICD-10-CM | POA: Insufficient documentation

## 2013-11-18 DIAGNOSIS — Z902 Acquired absence of lung [part of]: Secondary | ICD-10-CM | POA: Insufficient documentation

## 2013-11-18 DIAGNOSIS — Z9981 Dependence on supplemental oxygen: Secondary | ICD-10-CM | POA: Insufficient documentation

## 2013-11-18 DIAGNOSIS — Z8659 Personal history of other mental and behavioral disorders: Secondary | ICD-10-CM | POA: Insufficient documentation

## 2013-11-18 DIAGNOSIS — F0390 Unspecified dementia without behavioral disturbance: Secondary | ICD-10-CM | POA: Insufficient documentation

## 2013-11-18 DIAGNOSIS — F411 Generalized anxiety disorder: Secondary | ICD-10-CM | POA: Insufficient documentation

## 2013-11-18 DIAGNOSIS — I2510 Atherosclerotic heart disease of native coronary artery without angina pectoris: Secondary | ICD-10-CM | POA: Insufficient documentation

## 2013-11-18 DIAGNOSIS — Z8611 Personal history of tuberculosis: Secondary | ICD-10-CM | POA: Insufficient documentation

## 2013-11-18 DIAGNOSIS — Z87891 Personal history of nicotine dependence: Secondary | ICD-10-CM | POA: Insufficient documentation

## 2013-11-18 LAB — BASIC METABOLIC PANEL
BUN: 17 mg/dL (ref 6–23)
CHLORIDE: 103 meq/L (ref 96–112)
CO2: 26 meq/L (ref 19–32)
Calcium: 8.9 mg/dL (ref 8.4–10.5)
Creatinine, Ser: 1.12 mg/dL (ref 0.50–1.35)
GFR calc non Af Amer: 68 mL/min — ABNORMAL LOW (ref >90)
GFR, EST AFRICAN AMERICAN: 79 mL/min — AB (ref >90)
Glucose, Bld: 89 mg/dL (ref 70–99)
POTASSIUM: 4 meq/L (ref 3.7–5.3)
Sodium: 143 mEq/L (ref 137–147)

## 2013-11-18 LAB — CBC
HEMATOCRIT: 27.9 % — AB (ref 39.0–52.0)
HEMOGLOBIN: 8.2 g/dL — AB (ref 13.0–17.0)
MCH: 20.1 pg — AB (ref 26.0–34.0)
MCHC: 29.4 g/dL — AB (ref 30.0–36.0)
MCV: 68.6 fL — AB (ref 78.0–100.0)
Platelets: 397 10*3/uL (ref 150–400)
RBC: 4.07 MIL/uL — AB (ref 4.22–5.81)
RDW: 17.8 % — ABNORMAL HIGH (ref 11.5–15.5)
WBC: 8.3 10*3/uL (ref 4.0–10.5)

## 2013-11-18 LAB — PRO B NATRIURETIC PEPTIDE: PRO B NATRI PEPTIDE: 1975 pg/mL — AB (ref 0–125)

## 2013-11-18 LAB — POCT I-STAT TROPONIN I: TROPONIN I, POC: 0.03 ng/mL (ref 0.00–0.08)

## 2013-11-18 NOTE — ED Notes (Signed)
Pt walks at home with a walker, and states that last 3 days he has been so weak he can not stand, Has SOB, difficulty breathing, sore throat, emesis (unable to keep anything down).

## 2013-11-19 ENCOUNTER — Emergency Department (HOSPITAL_COMMUNITY)
Admission: EM | Admit: 2013-11-19 | Discharge: 2013-11-19 | Disposition: A | Discharge status: Home / Self Care | Payer: Medicaid Other | Attending: Emergency Medicine | Admitting: Emergency Medicine

## 2013-11-19 ENCOUNTER — Emergency Department (HOSPITAL_COMMUNITY): Payer: Medicaid Other

## 2013-11-19 DIAGNOSIS — J189 Pneumonia, unspecified organism: Secondary | ICD-10-CM

## 2013-11-19 LAB — POCT I-STAT 3, VENOUS BLOOD GAS (G3P V)
Acid-Base Excess: 2 mmol/L (ref 0.0–2.0)
Bicarbonate: 29.5 mEq/L — ABNORMAL HIGH (ref 20.0–24.0)
O2 Saturation: 63 %
PCO2 VEN: 53.5 mmHg — AB (ref 45.0–50.0)
PH VEN: 7.349 — AB (ref 7.250–7.300)
PO2 VEN: 35 mmHg (ref 30.0–45.0)
TCO2: 31 mmol/L (ref 0–100)

## 2013-11-19 LAB — RAPID URINE DRUG SCREEN, HOSP PERFORMED
Amphetamines: NOT DETECTED
BARBITURATES: NOT DETECTED
Benzodiazepines: NOT DETECTED
Cocaine: NOT DETECTED
Opiates: NOT DETECTED
Tetrahydrocannabinol: NOT DETECTED

## 2013-11-19 LAB — ETHANOL: Alcohol, Ethyl (B): <11 mg/dL (ref 0–11)

## 2013-11-19 LAB — AMMONIA: Ammonia: 35 umol/L (ref 11–60)

## 2013-11-19 MED ORDER — LEVOFLOXACIN 750 MG PO TABS: 750.0000 mg | ORAL_TABLET | Freq: Every day | ORAL | Status: DC

## 2013-11-19 MED ORDER — DEXTROSE 5 % IV SOLN
500.0000 mg | Freq: Once | INTRAVENOUS | Status: AC
  Administered 2013-11-19: 500 mg via INTRAVENOUS

## 2013-11-19 MED ORDER — DEXTROSE 5 % IV SOLN
1.0000 g | Freq: Once | INTRAVENOUS | Status: AC
  Administered 2013-11-19: 1 g via INTRAVENOUS
  Filled 2013-11-19: qty 10

## 2013-11-19 NOTE — ED Notes (Signed)
Called 3 different people to attempt to have them come pick up patient from hospital and take him home. No answer. Patient states his family said they were coming back.

## 2013-11-19 NOTE — ED Notes (Signed)
Removed foley cather

## 2013-11-19 NOTE — ED Notes (Signed)
Blood gas reported to Dr.Manly

## 2013-11-19 NOTE — ED Notes (Signed)
During In and out cath, attempted successfully by Yvone Neu, Nurse Tech, and reports there was a blood clot in catheter. Dr. Cheri Guppy was notified and ordered for a foley catheter with small urine return but urine was not draining into the catheter into the bag. Unable to obtain sample for UA. Foley catheter was irrigated per order from Dr. Cheri Guppy with no success of urine flow. Yvone Neu, NT utilized the bladder scanner to determine if there was any remaining urine in his bladder since the In and Out was performed. The bladder scanner reported 50ml in pts bladder. Dr. Cheri Guppy made aware and ordered to remove foley catheter. Foley catheter was irrigated again by Isa Rankin, RN with 31ml and only 46ml returned with resistance.

## 2013-11-19 NOTE — ED Notes (Signed)
IV team paged.  

## 2013-11-19 NOTE — ED Provider Notes (Signed)
CSN: [id number]     Arrival date & time 11/18/13  2122 History   First MD Initiated Contact with Patient 11/19/13 0048     Chief Complaint  Patient presents with  . Shortness of Breath   (Consider location/radiation/quality/duration/timing/severity/associated sxs/prior Treatment) HPI This patient is a 63 yo man who is in generally poor health. He lives with his dtr and is BIB dtr and son in [employer]. The patient has been sleeping more than usual, has been confused more than usual and has had difficulty tolerating po intake. The patient has a history of dementia as well as multiple chronic medical problems. Dtr states that he was bed bound for several months, then was up walking with assistance but, she is concerned because he has been experiencing a general downhill course.   The patient says he feels generally weak but, is without focal complaints. Specifically, he denies chest and abdominal pain, cough and SOB. No recent fevers.   Past Medical History  Diagnosis Date  . Essential hypertension, benign 02/26/2013  . Liver lesion     a. Previously biopsy-proven to be a cyst (per records from a hospital in [place]).  . Hypoxia     a. Adm 02/2013, required home O2.  . Drug abuse   . Depression   . Seizure disorder     "after hip OR 02/2012 once I got home" (04/01/2013)  . GI bleed     a. 2009 at time of stroke - received 3 u blood, no source found per pt  . HTN (hypertension)   . On home oxygen therapy     2.5L "usually use it at night" (04/01/2013)  . Tuberculosis   . Positive TB test   . History of blood transfusion 2009    "don't know what it was related to" (04/01/2013)  . Stroke 2009    a. Pt reports several, last in 2009. b. Residual L sided weakness.  . Anxiety   . Adenocarcinoma, lung     a. Dx 2013, underwent left lower lobectomy under the care of Dr.Lapunzina in [place] for a stage I non-small cell lung CA.; "went to [facility] ~ 06/2012 and they couldn't find anything" (04/01/2013)  .  Pneumonia   . Coronary atherosclerosis of native coronary artery    Past Surgical History  Procedure Laterality Date  . Hip arthroplasty Left 02/2012  . Lung biopsy Left 07/15/2012    "LLL" (04/01/2013)  . Lung removal, partial Left     /notes 02/26/2013  (04/01/2013)   Family History  Problem Relation Age of Onset  . Stroke    . CAD Neg Hx    History  Substance Use Topics  . Smoking status: Former Smoker -- 0.50 packs/day for 42 years    Types: Cigarettes    Quit date: 02/27/2012  . Smokeless tobacco: Never Used  . Alcohol Use: 29.4 oz/week    14 Cans of beer, 35 Shots of liquor per week     Comment: 04/01/2013 "~ 1/2 pint /day of rum plus 2 16oz beers/day"    Review of Systems Ten point review of symptoms performed and is negative with the exception of symptoms noted above.   Allergies  Review of patient's allergies indicates no known allergies.  Home Medications   Current Outpatient Rx  Name  Route  Sig  Dispense  Refill  . aspirin EC 81 MG EC tablet   Oral   Take 1 tablet (81 mg total) by mouth daily.         Marland Kitchen  folic acid (FOLVITE) 1 MG tablet   Oral   Take 1 tablet (1 mg total) by mouth daily.         . hydrALAZINE (APRESOLINE) 10 MG tablet   Oral   Take 1 tablet (10 mg total) by mouth 3 (three) times daily.   90 tablet   1   . lisinopril (PRINIVIL,ZESTRIL) 10 MG tablet   Oral   Take 0.5 tablets (5 mg total) by mouth daily.   30 tablet   1   . METHADONE HCL PO   Oral   Take 40 mg by mouth daily. As directed         . polyethylene glycol powder (GLYCOLAX/MIRALAX) powder   Oral   Take 17 g by mouth 2 (two) times daily. Until daily soft stools  OTC   255 g   0   . potassium chloride (K-DUR) 10 MEQ tablet   Oral   Take 1 tablet (10 mEq total) by mouth once.   30 tablet   0    BP 157/94  Pulse 93  Temp(Src) 98.2 F (36.8 C) (Oral)  Resp 18  Ht 5\' 11"  (1.803 m)  Wt 152 lb (68.947 kg)  BMI 21.21 kg/m2  SpO2 99% Physical Exam Gen: well  developed and thin appearing Head: NCAT Eyes: PERL, EOMI Nose: no epistaixis or rhinorrhea Mouth/throat: mucosa is moist and pink Neck: supple, no stridor Lungs: CTA B, no wheezing, rhonchi or rales CV: RRR, no murmur, extremities appear well perfused.  Abd: soft, notender, nondistended Back: no ttp, no cva ttp Skin: warm and dry Ext: normal to inspection, no dependent edema Neuro: CN ii-xii grossly intact, no focal deficits Psyche; flat  affect,  calm and cooperative.   ED Course  Procedures (including critical care time) Labs Review Results for orders placed during the hospital encounter of 11/19/13 (from the past 24 hour(s))  CBC     Status: Abnormal   Collection Time    11/18/13 11:03 PM      Result Value Range   WBC 8.3  4.0 - 10.5 K/uL   RBC 4.07 (*) 4.22 - 5.81 MIL/uL   Hemoglobin 8.2 (*) 13.0 - 17.0 g/dL   HCT 27.9 (*) 39.0 - 52.0 %   MCV 68.6 (*) 78.0 - 100.0 fL   MCH 20.1 (*) 26.0 - 34.0 pg   MCHC 29.4 (*) 30.0 - 36.0 g/dL   RDW 17.8 (*) 11.5 - 15.5 %   Platelets 397  150 - 400 K/uL  BASIC METABOLIC PANEL     Status: Abnormal   Collection Time    11/18/13 11:03 PM      Result Value Range   Sodium 143  137 - 147 mEq/L   Potassium 4.0  3.7 - 5.3 mEq/L   Chloride 103  96 - 112 mEq/L   CO2 26  19 - 32 mEq/L   Glucose, Bld 89  70 - 99 mg/dL   BUN 17  6 - 23 mg/dL   Creatinine, Ser 1.12  0.50 - 1.35 mg/dL   Calcium 8.9  8.4 - 10.5 mg/dL   GFR calc non Af Amer 68 (*) >90 mL/min   GFR calc Af Amer 79 (*) >90 mL/min  PRO B NATRIURETIC PEPTIDE     Status: Abnormal   Collection Time    11/18/13 11:03 PM      Result Value Range   Pro B Natriuretic peptide (BNP) 1975.0 (*) 0 - 125 pg/mL  POCT I-STAT TROPONIN  I     Status: None   Collection Time    11/18/13 11:16 PM      Result Value Range   Troponin i, poc 0.03  0.00 - 0.08 ng/mL   Comment 3           AMMONIA     Status: None   Collection Time    11/19/13  1:39 AM      Result Value Range   Ammonia 35  11 - 60  umol/L  ETHANOL     Status: None   Collection Time    11/19/13  1:39 AM      Result Value Range   Alcohol, Ethyl (B) <11  0 - 11 mg/dL  POCT I-STAT 3, BLOOD GAS (G3P V)     Status: Abnormal   Collection Time    11/19/13  2:25 AM      Result Value Range   pH, Ven 7.349 (*) 7.250 - 7.300   pCO2, Ven 53.5 (*) 45.0 - 50.0 mmHg   pO2, Ven 35.0  30.0 - 45.0 mmHg   Bicarbonate 29.5 (*) 20.0 - 24.0 mEq/L   TCO2 31  0 - 100 mmol/L   O2 Saturation 63.0     Acid-Base Excess 2.0  0.0 - 2.0 mmol/L   Sample type VENOUS     Comment NOTIFIED PHYSICIAN    URINE RAPID DRUG SCREEN (HOSP PERFORMED)     Status: None   Collection Time    11/19/13  4:55 AM      Result Value Range   Opiates NONE DETECTED  NONE DETECTED   Cocaine NONE DETECTED  NONE DETECTED   Benzodiazepines NONE DETECTED  NONE DETECTED   Amphetamines NONE DETECTED  NONE DETECTED   Tetrahydrocannabinol NONE DETECTED  NONE DETECTED   Barbiturates NONE DETECTED  NONE DETECTED    Imaging Review Dg Chest 2 View  11/18/2013   CLINICAL DATA:  Shortness of breath. Congestion for 2 days. Ex-smoker. Hypertension and diabetes.  EXAM: CHEST  2 VIEW  COMPARISON:  DG CHEST 2 VIEW dated 04/01/2013; CT CHEST W/CM dated 02/26/2013; NM PET IMAGE INITIAL (PI) SKULL BASE TO THIGH dated 03/17/2013  FINDINGS: Hyperinflation. Midline trachea. Mild cardiomegaly. Can't exclude right hilar soft tissue fullness. Improved left-sided aeration. Persistent interstitial and airspace opacities on the right. Most confluent in the right lower lobe. Small bilateral pleural effusions. No pneumothorax.  IMPRESSION: Patchy right-sided interstitial and airspace disease, suspicious for infection. Recommend radiographic follow-up until clearing.  Cardiomegaly with small bilateral pleural effusions.  Cannot exclude right hilar soft tissue fullness. Recommend attention on follow-up.   Electronically Signed   By: Abigail Miyamoto M.D.   On: 11/18/2013 22:25   EKG: nsr, no acute ischemic  changes, normal intervals, normal axis, normal qrs complex  MDM  Although the patient's CC per triage was SOB, his chief complaint during my interview actually seems to be generalized weakness. He does not have any focal neurologic deficits. His CXR is concerning for patchy right sided interstitial airspace disease. We have treated with abx here in the ED. Patient's VS have remained wnl and he has received IVF. I do not see an indication for admission. I believe he is stable for discharge with plan for outpatient f/u and empiric coverage with Levaquin for CAP.    Elyn Peers, MD 11/19/13 [phone]

## 2013-11-19 NOTE — ED Notes (Signed)
In and Out cath performed by this RN unsuccessfully. Another staff member to attempt in and out cath.

## 2013-11-19 NOTE — ED Notes (Signed)
Patient discharged to home with family. NAD.  

## 2013-11-21 ENCOUNTER — Emergency Department (HOSPITAL_COMMUNITY): Payer: Medicaid Other

## 2013-11-21 ENCOUNTER — Encounter (HOSPITAL_COMMUNITY): Payer: Self-pay | Admitting: Emergency Medicine

## 2013-11-21 ENCOUNTER — Inpatient Hospital Stay (HOSPITAL_COMMUNITY)
Admission: EM | Admit: 2013-11-21 | Discharge: 2013-11-28 | DRG: 177 | Disposition: A | Discharge status: Skilled Nursing Facility | Payer: Medicaid Other | Attending: Internal Medicine | Admitting: Internal Medicine

## 2013-11-21 ENCOUNTER — Observation Stay (HOSPITAL_COMMUNITY): Payer: Medicaid Other

## 2013-11-21 DIAGNOSIS — R9431 Abnormal electrocardiogram [ECG] [EKG]: Secondary | ICD-10-CM | POA: Diagnosis present

## 2013-11-21 DIAGNOSIS — F0150 Vascular dementia without behavioral disturbance: Secondary | ICD-10-CM | POA: Diagnosis present

## 2013-11-21 DIAGNOSIS — Z902 Acquired absence of lung [part of]: Secondary | ICD-10-CM

## 2013-11-21 DIAGNOSIS — J90 Pleural effusion, not elsewhere classified: Secondary | ICD-10-CM

## 2013-11-21 DIAGNOSIS — R531 Weakness: Secondary | ICD-10-CM | POA: Diagnosis present

## 2013-11-21 DIAGNOSIS — R29898 Other symptoms and signs involving the musculoskeletal system: Secondary | ICD-10-CM | POA: Diagnosis present

## 2013-11-21 DIAGNOSIS — F1910 Other psychoactive substance abuse, uncomplicated: Secondary | ICD-10-CM | POA: Diagnosis present

## 2013-11-21 DIAGNOSIS — I10 Essential (primary) hypertension: Secondary | ICD-10-CM | POA: Diagnosis present

## 2013-11-21 DIAGNOSIS — K5900 Constipation, unspecified: Secondary | ICD-10-CM | POA: Diagnosis present

## 2013-11-21 DIAGNOSIS — Z8611 Personal history of tuberculosis: Secondary | ICD-10-CM

## 2013-11-21 DIAGNOSIS — R740 Nonspecific elevation of levels of transaminase and lactic acid dehydrogenase [LDH]: Secondary | ICD-10-CM

## 2013-11-21 DIAGNOSIS — J189 Pneumonia, unspecified organism: Secondary | ICD-10-CM

## 2013-11-21 DIAGNOSIS — D509 Iron deficiency anemia, unspecified: Secondary | ICD-10-CM | POA: Diagnosis present

## 2013-11-21 DIAGNOSIS — R7401 Elevation of levels of liver transaminase levels: Secondary | ICD-10-CM

## 2013-11-21 DIAGNOSIS — I5042 Chronic combined systolic (congestive) and diastolic (congestive) heart failure: Secondary | ICD-10-CM | POA: Diagnosis present

## 2013-11-21 DIAGNOSIS — K224 Dyskinesia of esophagus: Secondary | ICD-10-CM | POA: Diagnosis present

## 2013-11-21 DIAGNOSIS — D638 Anemia in other chronic diseases classified elsewhere: Secondary | ICD-10-CM

## 2013-11-21 DIAGNOSIS — I672 Cerebral atherosclerosis: Secondary | ICD-10-CM | POA: Diagnosis present

## 2013-11-21 DIAGNOSIS — I2589 Other forms of chronic ischemic heart disease: Secondary | ICD-10-CM | POA: Diagnosis present

## 2013-11-21 DIAGNOSIS — I5041 Acute combined systolic (congestive) and diastolic (congestive) heart failure: Secondary | ICD-10-CM

## 2013-11-21 DIAGNOSIS — R591 Generalized enlarged lymph nodes: Secondary | ICD-10-CM

## 2013-11-21 DIAGNOSIS — R933 Abnormal findings on diagnostic imaging of other parts of digestive tract: Secondary | ICD-10-CM

## 2013-11-21 DIAGNOSIS — Z7982 Long term (current) use of aspirin: Secondary | ICD-10-CM

## 2013-11-21 DIAGNOSIS — G40909 Epilepsy, unspecified, not intractable, without status epilepticus: Secondary | ICD-10-CM | POA: Diagnosis present

## 2013-11-21 DIAGNOSIS — D649 Anemia, unspecified: Secondary | ICD-10-CM

## 2013-11-21 DIAGNOSIS — R1310 Dysphagia, unspecified: Secondary | ICD-10-CM | POA: Diagnosis present

## 2013-11-21 DIAGNOSIS — C3490 Malignant neoplasm of unspecified part of unspecified bronchus or lung: Secondary | ICD-10-CM | POA: Diagnosis present

## 2013-11-21 DIAGNOSIS — J9601 Acute respiratory failure with hypoxia: Secondary | ICD-10-CM

## 2013-11-21 DIAGNOSIS — F1010 Alcohol abuse, uncomplicated: Secondary | ICD-10-CM

## 2013-11-21 DIAGNOSIS — Z21 Asymptomatic human immunodeficiency virus [HIV] infection status: Secondary | ICD-10-CM | POA: Diagnosis present

## 2013-11-21 DIAGNOSIS — I69998 Other sequelae following unspecified cerebrovascular disease: Secondary | ICD-10-CM

## 2013-11-21 DIAGNOSIS — F1110 Opioid abuse, uncomplicated: Secondary | ICD-10-CM | POA: Diagnosis present

## 2013-11-21 DIAGNOSIS — J690 Pneumonitis due to inhalation of food and vomit: Principal | ICD-10-CM | POA: Diagnosis present

## 2013-11-21 DIAGNOSIS — Z96649 Presence of unspecified artificial hip joint: Secondary | ICD-10-CM

## 2013-11-21 DIAGNOSIS — I428 Other cardiomyopathies: Secondary | ICD-10-CM | POA: Diagnosis present

## 2013-11-21 DIAGNOSIS — I2510 Atherosclerotic heart disease of native coronary artery without angina pectoris: Secondary | ICD-10-CM | POA: Diagnosis present

## 2013-11-21 DIAGNOSIS — G9340 Encephalopathy, unspecified: Secondary | ICD-10-CM | POA: Diagnosis present

## 2013-11-21 DIAGNOSIS — Z85118 Personal history of other malignant neoplasm of bronchus and lung: Secondary | ICD-10-CM

## 2013-11-21 DIAGNOSIS — R911 Solitary pulmonary nodule: Secondary | ICD-10-CM

## 2013-11-21 LAB — BASIC METABOLIC PANEL
BUN: 17 mg/dL (ref 6–23)
CALCIUM: 8.5 mg/dL (ref 8.4–10.5)
CO2: 26 meq/L (ref 19–32)
Chloride: 104 mEq/L (ref 96–112)
Creatinine, Ser: 1.01 mg/dL (ref 0.50–1.35)
GFR calc Af Amer: 89 mL/min — ABNORMAL LOW (ref >90)
GFR calc non Af Amer: 77 mL/min — ABNORMAL LOW (ref >90)
GLUCOSE: 82 mg/dL (ref 70–99)
Potassium: 4.2 mEq/L (ref 3.7–5.3)
SODIUM: 142 meq/L (ref 137–147)

## 2013-11-21 LAB — CBC
HCT: 28.4 % — ABNORMAL LOW (ref 39.0–52.0)
Hemoglobin: 8.5 g/dL — ABNORMAL LOW (ref 13.0–17.0)
MCH: 20 pg — ABNORMAL LOW (ref 26.0–34.0)
MCHC: 29.9 g/dL — ABNORMAL LOW (ref 30.0–36.0)
MCV: 67 fL — ABNORMAL LOW (ref 78.0–100.0)
Platelets: 319 10*3/uL (ref 150–400)
RBC: 4.24 MIL/uL (ref 4.22–5.81)
RDW: 17.9 % — ABNORMAL HIGH (ref 11.5–15.5)
WBC: 7.9 10*3/uL (ref 4.0–10.5)

## 2013-11-21 LAB — DIFFERENTIAL
BASOS PCT: 0 % (ref 0–1)
Basophils Absolute: 0 10*3/uL (ref 0.0–0.1)
EOS PCT: 1 % (ref 0–5)
Eosinophils Absolute: 0.1 10*3/uL (ref 0.0–0.7)
LYMPHS PCT: 17 % (ref 12–46)
Lymphs Abs: 1.3 10*3/uL (ref 0.7–4.0)
MONOS PCT: 8 % (ref 3–12)
Monocytes Absolute: 0.6 10*3/uL (ref 0.1–1.0)
NEUTROS PCT: 74 % (ref 43–77)
Neutro Abs: 5.9 10*3/uL (ref 1.7–7.7)

## 2013-11-21 LAB — URINALYSIS, ROUTINE W REFLEX MICROSCOPIC
Glucose, UA: NEGATIVE mg/dL
Hgb urine dipstick: NEGATIVE
Ketones, ur: NEGATIVE mg/dL
Leukocytes, UA: NEGATIVE
NITRITE: NEGATIVE
Protein, ur: NEGATIVE mg/dL
SPECIFIC GRAVITY, URINE: 1.029 (ref 1.005–1.030)
Urobilinogen, UA: 2 mg/dL — ABNORMAL HIGH (ref 0.0–1.0)
pH: 5.5 (ref 5.0–8.0)

## 2013-11-21 LAB — POCT I-STAT TROPONIN I: Troponin i, poc: 0.03 ng/mL (ref 0.00–0.08)

## 2013-11-21 LAB — TROPONIN I

## 2013-11-21 LAB — CG4 I-STAT (LACTIC ACID): LACTIC ACID, VENOUS: 1.56 mmol/L (ref 0.5–2.2)

## 2013-11-21 LAB — GLUCOSE, CAPILLARY: GLUCOSE-CAPILLARY: 104 mg/dL — AB (ref 70–99)

## 2013-11-21 MED ORDER — DEXTROSE 5 % IV SOLN
500.0000 mg | INTRAVENOUS | Status: DC
  Administered 2013-11-21: 500 mg via INTRAVENOUS

## 2013-11-21 MED ORDER — METHADONE HCL 10 MG PO TABS: 60.0000 mg | ORAL_TABLET | Freq: Every day | ORAL | Status: DC

## 2013-11-21 MED ORDER — IPRATROPIUM BROMIDE 0.02 % IN SOLN
0.5000 mg | Freq: Once | RESPIRATORY_TRACT | Status: AC
  Administered 2013-11-21: 0.5 mg via RESPIRATORY_TRACT
  Filled 2013-11-21: qty 2.5

## 2013-11-21 MED ORDER — THIAMINE HCL 100 MG/ML IJ SOLN
100.0000 mg | Freq: Every day | INTRAMUSCULAR | Status: DC
  Administered 2013-11-21: 100 mg via INTRAVENOUS
  Filled 2013-11-21: qty 1

## 2013-11-21 MED ORDER — METHADONE HCL 40 MG PO TBSO: 63.0000 mg | ORAL_TABLET | Freq: Every day | ORAL | Status: DC

## 2013-11-21 MED ORDER — DEXTROSE 5 % IV SOLN
1.0000 g | INTRAVENOUS | Status: DC
  Administered 2013-11-21: 1 g via INTRAVENOUS
  Filled 2013-11-21 (×2): qty 10

## 2013-11-21 MED ORDER — IPRATROPIUM-ALBUTEROL 0.5-2.5 (3) MG/3ML IN SOLN
3.0000 mL | Freq: Four times a day (QID) | RESPIRATORY_TRACT | Status: DC
Start: 2013-11-21 — End: 2013-11-21
  Administered 2013-11-21: 3 mL via RESPIRATORY_TRACT
  Filled 2013-11-21: qty 3

## 2013-11-21 MED ORDER — SODIUM CHLORIDE 0.9 % IJ SOLN
3.0000 mL | Freq: Two times a day (BID) | INTRAMUSCULAR | Status: DC
  Administered 2013-11-24: 3 mL via INTRAVENOUS

## 2013-11-21 MED ORDER — LORAZEPAM 2 MG/ML IJ SOLN
1.0000 mg | Freq: Four times a day (QID) | INTRAMUSCULAR | Status: DC | PRN
  Administered 2013-11-23: 1 mg via INTRAVENOUS
  Filled 2013-11-21: qty 1

## 2013-11-21 MED ORDER — DOXYCYCLINE HYCLATE 100 MG PO TABS
100.0000 mg | ORAL_TABLET | Freq: Two times a day (BID) | ORAL | Status: DC
  Administered 2013-11-21 – 2013-11-22 (×2): 100 mg via ORAL
  Filled 2013-11-21 (×3): qty 1

## 2013-11-21 MED ORDER — SODIUM CHLORIDE 0.9 % IV SOLN
INTRAVENOUS | Status: DC
  Administered 2013-11-21 – 2013-11-22 (×2): via INTRAVENOUS

## 2013-11-21 MED ORDER — FOLIC ACID 1 MG PO TABS: 1.0000 mg | ORAL_TABLET | Freq: Every day | ORAL | Status: DC

## 2013-11-21 MED ORDER — IPRATROPIUM-ALBUTEROL 0.5-2.5 (3) MG/3ML IN SOLN
3.0000 mL | Freq: Three times a day (TID) | RESPIRATORY_TRACT | Status: DC
  Administered 2013-11-22 – 2013-11-25 (×9): 3 mL via RESPIRATORY_TRACT
  Filled 2013-11-21 (×10): qty 3

## 2013-11-21 MED ORDER — METHADONE HCL 10 MG PO TABS
50.0000 mg | ORAL_TABLET | Freq: Every day | ORAL | Status: DC
  Administered 2013-11-22: 50 mg via ORAL
  Filled 2013-11-21: qty 5

## 2013-11-21 MED ORDER — FOLIC ACID 1 MG PO TABS
1.0000 mg | ORAL_TABLET | Freq: Every day | ORAL | Status: DC
Start: 2013-11-21 — End: 2013-11-23
  Administered 2013-11-21 – 2013-11-22 (×2): 1 mg via ORAL
  Filled 2013-11-21 (×3): qty 1

## 2013-11-21 MED ORDER — VITAMIN B-1 100 MG PO TABS
100.0000 mg | ORAL_TABLET | Freq: Every day | ORAL | Status: DC
  Administered 2013-11-22: 100 mg via ORAL
  Filled 2013-11-21: qty 1

## 2013-11-21 MED ORDER — LORAZEPAM 1 MG PO TABS
1.0000 mg | ORAL_TABLET | Freq: Four times a day (QID) | ORAL | Status: DC | PRN
  Administered 2013-11-21: 1 mg via ORAL
  Filled 2013-11-21: qty 1

## 2013-11-21 MED ORDER — IOHEXOL 300 MG/ML  SOLN
80.0000 mL | Freq: Once | INTRAMUSCULAR | Status: AC | PRN
  Administered 2013-11-21: 80 mL via INTRAVENOUS

## 2013-11-21 MED ORDER — SODIUM CHLORIDE 0.9 % IV SOLN
INTRAVENOUS | Status: DC
Start: 2013-11-21 — End: 2013-11-21
  Administered 2013-11-21: 10 mL/h via INTRAVENOUS

## 2013-11-21 MED ORDER — IPRATROPIUM-ALBUTEROL 0.5-2.5 (3) MG/3ML IN SOLN
3.0000 mL | RESPIRATORY_TRACT | Status: DC
  Administered 2013-11-21: 3 mL via RESPIRATORY_TRACT
  Filled 2013-11-21: qty 3

## 2013-11-21 MED ORDER — ALBUTEROL SULFATE (2.5 MG/3ML) 0.083% IN NEBU
5.0000 mg | INHALATION_SOLUTION | Freq: Once | RESPIRATORY_TRACT | Status: AC
  Administered 2013-11-21: 5 mg via RESPIRATORY_TRACT
  Filled 2013-11-21: qty 6

## 2013-11-21 MED ORDER — ADULT MULTIVITAMIN W/MINERALS CH
1.0000 | ORAL_TABLET | Freq: Every day | ORAL | Status: DC
  Administered 2013-11-21 – 2013-11-28 (×7): 1 via ORAL
  Filled 2013-11-21 (×8): qty 1

## 2013-11-21 MED ORDER — LISINOPRIL 10 MG PO TABS
10.0000 mg | ORAL_TABLET | Freq: Every day | ORAL | Status: DC
  Administered 2013-11-21 – 2013-11-22 (×2): 10 mg via ORAL
  Filled 2013-11-21 (×2): qty 1

## 2013-11-21 MED ORDER — ASPIRIN EC 81 MG PO TBEC
81.0000 mg | DELAYED_RELEASE_TABLET | Freq: Every day | ORAL | Status: DC
  Administered 2013-11-21 – 2013-11-22 (×2): 81 mg via ORAL
  Filled 2013-11-21 (×3): qty 1

## 2013-11-21 MED ORDER — POLYETHYLENE GLYCOL 3350 17 GM/SCOOP PO POWD
17.0000 g | Freq: Two times a day (BID) | ORAL | Status: DC | PRN
  Filled 2013-11-21: qty 255

## 2013-11-21 MED ORDER — HYDRALAZINE HCL 10 MG PO TABS
10.0000 mg | ORAL_TABLET | Freq: Three times a day (TID) | ORAL | Status: DC
  Administered 2013-11-21 – 2013-11-22 (×3): 10 mg via ORAL
  Filled 2013-11-21 (×5): qty 1

## 2013-11-21 MED ORDER — HEPARIN SODIUM (PORCINE) 5000 UNIT/ML IJ SOLN
5000.0000 [IU] | Freq: Three times a day (TID) | INTRAMUSCULAR | Status: DC
  Administered 2013-11-21 – 2013-11-28 (×21): 5000 [IU] via SUBCUTANEOUS
  Filled 2013-11-21 (×23): qty 1

## 2013-11-21 NOTE — ED Notes (Signed)
IV attempted x 2 by 2 nurses . IV team called.

## 2013-11-21 NOTE — H&P (Signed)
Date: 11/21/2013               Patient Name:  Mark [name] MRN: [id number]  DOB: 1951/09/07 Age / Sex: 63 y.o., male   PCP: No Pcp Per Patient         Medical Service: Internal Medicine Teaching Service         Attending Physician: Dr. Leota Jacobsen, MD    First Contact: Tawni Levy Pager:  [phone]  Second Contact: Dr. 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 The daughter Baxter Flattery) and son-in-law (Rod) report that for the past week he has been more lethargic, decreased appetite, confused and weak.  Rod also reports that recently Mark [name] methadone dose was increased to 63mg  from the 50's 1-2 weeks ago and since then he has been noticing more of these changes.  Mark [name] was initially sleeping but then woke up to answer our questions. He was awake, alert and oriented. He reports feeling more tired lately and has not been eating much.  He also reports falling a couple of days ago on the side of his bed but denies hitting his head, stating he had his head up the whole time, and denies loss of consciousness.  In regards to his cough, he has been taking his levaquin as prescribed by the ED two days prior but says it has not helped much and that he still has a productive cough with white sputum. He denies shortness of breath, fever, or chills, no chest pain, nausea or vomiting and no diarrhea.  He is constipated and has not had a  BM in 5 days.   Meds: Current Facility-Administered Medications  Medication Dose Route Frequency Provider Last Rate Last Dose  . 0.9 %  sodium chloride infusion   Intravenous Continuous Leota Jacobsen, MD 10 mL/hr at 11/21/13 1149 10 mL/hr at 11/21/13 1149  . cefTRIAXone (ROCEPHIN) 1 g in dextrose 5 % 50 mL IVPB  1 g Intravenous Q24H Leota Jacobsen, MD 100 mL/hr at 11/21/13 1303 1 g at 11/21/13 1303   Current Outpatient Prescriptions  Medication Sig Dispense Refill  . aspirin EC 81 MG EC tablet Take 1 tablet (81 mg total) by mouth daily.      . folic acid (FOLVITE) 1 MG tablet Take 1 tablet (1 mg total) by mouth daily.      . hydrALAZINE (APRESOLINE) 10 MG tablet Take 1 tablet (10 mg total) by mouth 3 (three) times daily.  90 tablet  1  . levofloxacin (LEVAQUIN) 750 MG tablet Take 1 tablet (750 mg total) by mouth daily. X 5 days  7 tablet  0  . lisinopril (PRINIVIL,ZESTRIL) 10 MG tablet Take 10 mg by mouth daily.      Marland Kitchen METHADONE HCL PO Take 63 mg by mouth daily. As directed      . polyethylene glycol powder (GLYCOLAX/MIRALAX) powder Take 17 g by mouth 2 (two) times daily. Until daily soft stools  OTC  255 g  0  . potassium chloride (K-DUR) 10 MEQ tablet Take 1 tablet (10 mEq total) by mouth once.  30 tablet  0   Allergies: Allergies as of 11/21/2013  . (No Known Allergies)   Past Medical History  Diagnosis Date  . Essential hypertension, benign 02/26/2013  . Liver lesion     a. Previously biopsy-proven to be a cyst (per records from a hospital in [place]).  . Hypoxia     a. Adm 02/2013, required home O2.  . Drug abuse   . Depression   . Seizure disorder     "after hip OR 02/2012 once I got home" (04/01/2013)  . GI bleed     a. 2009 at time of stroke - received 3 u blood, no source found per pt  . HTN (hypertension)   . On home oxygen therapy     2.5L "usually use it at night" (04/01/2013)  . Tuberculosis   . Positive TB test   . History of blood transfusion 2009    "don't know  what it was related to" (04/01/2013)  . Stroke 2009    a. Pt reports several, last in 2009. b. Residual L sided weakness.  . Anxiety   . Adenocarcinoma, lung     a. Dx 2013, underwent left lower lobectomy under the care of Dr.Lapunzina in [place] for a stage I non-small cell lung CA.; "went to [facility] ~ 06/2012 and they couldn't find anything" (04/01/2013)  . Pneumonia   . Coronary atherosclerosis of native coronary artery    Past Surgical History  Procedure Laterality Date  . Hip arthroplasty Left 02/2012  . Lung biopsy Left 07/15/2012    "LLL" (04/01/2013)  . Lung removal, partial Left     /notes 02/26/2013  (04/01/2013)   Family History  Problem Relation Age of Onset  . Stroke    . CAD Neg Hx    History   Social History  . Marital Status: Widowed    Spouse Name: N/A    Number of Children: N/A  . Years of Education: N/A   Occupational History  . Not on file.   Social History Main Topics  . Smoking status: Former Smoker -- 0.50 packs/day for 42 years    Types: Cigarettes    Quit date: 02/27/2012  . Smokeless tobacco: Never Used  . Alcohol Use: 29.4 oz/week    14 Cans of beer, 35 Shots of liquor per week     Comment: 04/01/2013 "~ 1/2 pint /day of rum plus 2 16oz beers/day"  . Drug Use: No     Comment: Prior h/o cocaine and heroin. Currently on methadone.  . Sexual Activity: Not Currently   Other Topics Concern  . Not on file   Social History Narrative  . No narrative on file   Review of Systems:  Constitutional:  Decreased appetite and fatigue. Denies fever, chills, diaphoresis.   HEENT:  Denies congestion, sore throat.   Respiratory:  SOB, DOE, productive cough. Denies wheezing.   Cardiovascular:  Denies palpitations and leg swelling.   Gastrointestinal:  Constipation.  Denies nausea, vomiting, abdominal pain, diarrhea, blood in stool and abdominal distention.   Genitourinary:  Decreased urine output, difficulty urinating at times. Denies dysuria, urgency, frequency,  hematuria.   Musculoskeletal:  Leg pain, gait problem.  Denies myalgias.  Skin:  Denies pallor, rash and wound.   Neurological:  Weakness, occasional dizziness, light-headedness, ?seizures.    Physical Exam: Blood pressure 153/97, pulse  81, temperature 98.5 F (36.9 C), temperature source Oral, resp. rate 12, SpO2 100.00%. Vitals reviewed. General: resting in bed, sleeping, NAD HEENT: PERRL, EOMI Cardiac: RRR, loud S1 Pulm: clear to auscultation bilaterally, decreased breath sounds b/l bases Abd: soft, nontender, nondistended, BS present Ext: warm and well perfused, no pedal edema, +2dp b/l, darkened and hardened toe-nails Neuro: alert and oriented X3, cranial nerves II-XII grossly intact, strength and sensation to light touch equal in bilateral upper and lower extremities, gait: slow but steady, right knee stays bent, mild rocking on standing but no pronator drift, slight past pointing with right hand on finger to nose, heel to shin in tact and slower with left hand on rapid alternating movements.  Following all commands.  Skin: dry  Lab results: Basic Metabolic Panel:  Recent Labs  11/18/13 2303 11/21/13 0942  NA 143 142  K 4.0 4.2  CL 103 104  CO2 26 26  GLUCOSE 89 82  BUN 17 17  CREATININE 1.12 1.01  CALCIUM 8.9 8.5    Recent Labs  11/19/13 0139  AMMONIA 35   CBC:  Recent Labs  11/18/13 2303 11/21/13 0942  WBC 8.3 7.9  HGB 8.2* 8.5*  HCT 27.9* 28.4*  MCV 68.6* 67.0*  PLT 397 319   Cardiac Enzymes:  Recent Labs  11/21/13 1145  TROPONINI <0.30   BNP:  Recent Labs  11/18/13 2303  PROBNP 1975.0*   Urine Drug Screen: Drugs of Abuse     Component Value Date/Time   LABOPIA NONE DETECTED 11/19/2013 0455   COCAINSCRNUR NONE DETECTED 11/19/2013 0455   LABBENZ NONE DETECTED 11/19/2013 0455   AMPHETMU NONE DETECTED 11/19/2013 0455   THCU NONE DETECTED 11/19/2013 0455   LABBARB NONE DETECTED 11/19/2013 0455    Alcohol Level:  Recent Labs   11/19/13 0139  ETH <11   Urinalysis:  Recent Labs  11/21/13 1236  COLORURINE AMBER*  LABSPEC 1.029  PHURINE 5.5  GLUCOSEU NEGATIVE  HGBUR NEGATIVE  BILIRUBINUR SMALL*  KETONESUR NEGATIVE  PROTEINUR NEGATIVE  UROBILINOGEN 2.0*  NITRITE NEGATIVE  LEUKOCYTESUR NEGATIVE   Imaging results:  Dg Chest 2 View  11/21/2013   CLINICAL DATA:  One week history of shortness of breath  EXAM: CHEST  2 VIEW  COMPARISON:  Prior chest x-ray 11/18/2013 ; prior CT chest/abdomen/ pelvis 02/26/2013  FINDINGS: Slightly increased ill-defined patchy airspace opacities predominantly in the right lung base but also in the bilateral mid lungs. A persistent moderate right layering pleural effusion. Also likely a small left pleural effusion versus pleural thickening. Unchanged cardiac and mediastinal contours. Atherosclerotic calcification noted in the transverse aorta. No acute osseous abnormality.  IMPRESSION: 1. Similar to slight interval progression of bilateral patchy airspace disease worst in the right lower lobe. Findings remain concerning for a multifocal infectious/inflammatory process. 2. Small bilateral pleural effusions.   Electronically Signed   By: Jacqulynn Cadet M.D.   On: 11/21/2013 13:07   Other results: EKG: 81bpm, NSR, LVH, prolonged qtc 579  Assessment & Plan by Problem: Principal Problem:   CAP (community acquired pneumonia) Active Problems:   Anemia   Acute encephalopathy   Generalized weakness   Adenocarcinoma, lung s/p partial lobectomy   Dysphagia, unspecified(787.20)   Chronic combined systolic and diastolic congestive heart failure   Substance abuse   Essential hypertension, benign   Unspecified constipation  Mark [name] is a 63 year old African American male with combined CHF, lung adenocarcinoma s/p partial lobectomy, recurrent pneumonias, and substance abuse admitted for CAP.  CAP--was prescribed levaquin for outpatient treatment in ED visit on 11/18/13 for R sided  infection.   Returns today for productive cough and lack of improvement of symptoms.  CXR today with slight interval progression of b/l patchy airspace disease worse in RLL concerning for multifocal infectious/inflammatory process with persistent small b/l effusions.  QTC prolonged on EKG, thus will need to change from levaquin. ED started Rocephin and Azithromycin, although Azithromycin can also prolong QT and thus we will change to Rocephin and Doxycycline.  Reviewed CXR's and prior CT scans with radiology on the phone given history of lung cancer and concern of recurring infections, not much changed but possible recurrent malignancy?  Reviewing prior oncology records with Dr. Inda Merlin in May 2014 who at that time recommended follow up scans in October after May 2014 PET and CT scans.  Thus, we discussed with on-call oncology, Dr. Juliann Mule, about the case, and he also recommended CT chest with contrast at this time.  -admit to tele -continue Rocephin and start doxycyline for CAP -oxygen prn, keep o2 sats >92% -tylenol prn fever -blood cultures x2  -Respiratory virus panel -HIV -legionella and strep pneumoniae urinary antigen -sputum cx -AM labs: cbc, cmet -cycle CE -CT chest with contrast -NPO for now pending SLP eval--dysphagia ?aspiration risk -duoneb q6h -check pulse ox with ambulation  Acute encephalopathy--likely multifactorial given history of dementia, recent PNA, hx of prior CVA's, and on medications including recent increased dose of Methadone and levaquin.  Mark [name] was initially sleeping but then woke up, was alert, awake, and oriented, following all commands and responding appropriately.  Does report a fall at home several days ago, but denies hitting his head and no LOC. CT head on 11/19/13: no acute intracranial pathology, cerebellar atrophy, and scattered small vessel ischemic microangiopathy with scattered chronic lacunar infarcts within the BG. Family dose describe ?seizure like  activity with mild shaking of extremities and blank staring but not on any medications. Patient says he has been told in the past but does not recall being on any medications -neuro checks -decrease dose of methadone to 50mg , will need to verify methadone dose from [facility] (no answer on weekends) -fall and seizure precautions -consider EEG -d/c levaquin -NPO for now, pending SLP eval -PT/OT  Iron deficiency anemia--Hb 8.5 with MCV 67 on admission, appears stable from 04/2013.  Denies any hematuria or bloody BM. Not on iron supplementation at home.  -trend cbc -recommend starting iron supplementation if patient agrees  Adenocarcinoma of lung s/p left partial lobectomy in [place] (Dr. Cydney Ok once by Dr. Julien Nordmann at [place] [facility] in May 2014 for ?recurrency.  At that time, he was recommended for PET scan for further evaluation and was to follow up in 2 weeks (looks like was not done) and if no evidence of disease recurrence on PET was recommended to repeat imaging in 6 months (also not done).  Discussed with on-call oncology on the phone today, Dr. Juliann Mule, who did recommend proceeding with CT chest with contrast at this time.  -follow up CT chest with contrast -will need outpatient oncology follow up -does not smoke cigarettes anymore, but does smoke hookah. Counseled on cessation of smoking  Dysphagia--ongoing for several weeks, occasional trouble swallowing and pain and regurgitates undigested food at times. Aspiration risk. Hx of CVA's and malignancy lost to follow up.  -NPO for now -SLP evaluation  Chronic combined systolic and diastolic heart failure--per echo 03/2013: EF 35-40%, diffuse hypokinesis, moderately reduced systolic function, grade 2 diastolic dysfunction,  diffuse hypokinesis.  Home medications include ASA 81mg  qd, hydralazine 10mg  tid, and lisinopril 10mg  qd. Weight today 154lb's.  Weight in August noted to be 147lb's.  Does not appear volume overloaded on exam today, no lower  extremity edema. CXR with chronic b/l small effusions.  -continue home medications for now -cycle CE  HTN--BP on admission 153/97. Home medications include lisinopril 10mg  qd and hydralazine 10mg  TID.  -will continue home meds for now  Hx of substance abuse--hx of heroin use, now on methadone (dose recently increased to 63mg  qd per family, still smokes hookah but denies recent cigarette, or other illicit drug use.  Occasional alcohol use per patient, family states drinks every night but not heavy drinker anymore.  -HIV -UDS -counseled on cessation -continue methadone, need to verify dose, but will reduce dose to 50mg  for now given increased confusion with recent dose increase -CIWA protocol   Constipation--reports lack of BM for 5 days but also has had decreased appetite. -miralax  Diet: NPO for now pending SLP eval DVT Ppx: Heparin Dispo: Disposition is deferred at this time, awaiting improvement of current medical problems. Anticipated discharge in approximately 1-2 day(s).   [facility] and has [facility] appointment on 12/15/13 and does not need an [facility] hospital follow-up appointment after discharge.  The patient does not have transportation limitations that hinder transportation to clinic appointments.  Signed: Jerene Pitch, MD 11/21/2013, 1:46 PM

## 2013-11-21 NOTE — H&P (Signed)
  I have seen and examined the patient, and reviewed the H&P note by Corey Skains, MS 4 and discussed the care of the patient with them. Please see my progress note from 11/21/2013 for further details regarding assessment and plan.    Signed:  Jerene Pitch, MD 11/21/2013, 7:54 PM

## 2013-11-21 NOTE — ED Notes (Signed)
Pt stated that he urinates 1X/day.

## 2013-11-21 NOTE — ED Notes (Signed)
Lab results given to EDP. 

## 2013-11-21 NOTE — ED Notes (Signed)
Family states the pt was seen here 2 days ago and diagnosed with pneumonia, sent home on oral abx and has been taking as instructed. They said the pt seems to be getting worse and had a productive cough throughout the night last night and could hardly walk when he got out of the bed this am. Pt states his chest hurts.

## 2013-11-21 NOTE — H&P (Signed)
[facility] Admission Note Date: 11/21/2013  Patient name: Mark [name] Medical record number: [id number] Date of birth: 1951/03/04 Age: 63 y.o. Gender: male PCP: No PCP Per Patient  Medical Service: Internal Medicine Teaching Service, [place]  Attending physician: Joines     Chief Complaint: generalized weakness  History of Present Illness: This is a 63 year old man with a PMH of L lung adenocarcinoma s/p partial lobectomy without chemo/radiation in 2009 and history of TB, as well as multiple lacunar strokes with mild vascular dementia, CAD, HTN, previous GI bleed, depression and anxiety, and polysubstance abuse now on methadone, who presents with his daughter and son-in-law complaining of progressively worsening generalized weakness, sleep difficulty, and waxing and waning confusion and gait instability for the past two weeks. The history is per the patient and his family members. These symptoms have been associated with a cough productive of green and reddish sputum and mild shortness of breath at night over the same time period.   He was seen two days ago in the ED and was treated with Levaquin for community-acquired pneumonia on an outpatient basis; however, the ED provider at that time also felt that the family's chief complaint was generalized weakness. Since that admission, the family says that his episodes of confusion have gotten worse and that he frequently forgets where he is or what day it is. Other times, he is cognitively at his baseline. According to his son-in-law, the downturn in his functioning seems to have occurred a couple of weeks ago when his methadone dose was increased.  He has had one fall during this time period, about a week ago. He fell beside his bed but states that he did not lose consciousness and was able to avoid hitting his head; nothing was injured at that time.  Of note, the patient saw Dr. Julien Nordmann of [facility] in follow up of his  previous lung cancer last May 2014, at which time the patient was planned to undergo a PET scan on 5/20 and follow up two weeks later. It appears the patient did not follow up after the PET and instead was hospitalized for CHF in July 2014 and underwent cardiac catheterization.  Please see ROS for a list of other symptoms reported by the patient and family.  Meds: Current Outpatient Rx  Name  Route  Sig  Dispense  Refill  . aspirin EC 81 MG EC tablet   Oral   Take 1 tablet (81 mg total) by mouth daily.         . folic acid (FOLVITE) 1 MG tablet   Oral   Take 1 tablet (1 mg total) by mouth daily.         . hydrALAZINE (APRESOLINE) 10 MG tablet   Oral   Take 1 tablet (10 mg total) by mouth 3 (three) times daily.   90 tablet   1   . levofloxacin (LEVAQUIN) 750 MG tablet   Oral   Take 1 tablet (750 mg total) by mouth daily. X 5 days   7 tablet   0   . lisinopril (PRINIVIL,ZESTRIL) 10 MG tablet   Oral   Take 10 mg by mouth daily.         Marland Kitchen METHADONE HCL PO   Oral   Take 63 mg by mouth daily. As directed         . polyethylene glycol powder (GLYCOLAX/MIRALAX) powder   Oral   Take 17 g by mouth 2 (  two) times daily. Until daily soft stools  OTC   255 g   0   . potassium chloride (K-DUR) 10 MEQ tablet   Oral   Take 1 tablet (10 mEq total) by mouth once.   30 tablet   0     Allergies: Allergies as of 11/21/2013  . (No Known Allergies)   Past Medical History  Diagnosis Date  . Essential hypertension, benign 02/26/2013  . Liver lesion     a. Previously biopsy-proven to be a cyst (per records from a hospital in [place]).  . Hypoxia     a. Adm 02/2013, required home O2.  . Drug abuse   . Depression   . Seizure disorder     "after hip OR 02/2012 once I got home" (04/01/2013)  . GI bleed     a. 2009 at time of stroke - received 3 u blood, no source found per pt  . HTN (hypertension)   . On home oxygen therapy     2.5L "usually use it at night" (04/01/2013)  .  Tuberculosis   . Positive TB test   . History of blood transfusion 2009    "don't know what it was related to" (04/01/2013)  . Stroke 2009    a. Pt reports several, last in 2009. b. Residual L sided weakness.  . Anxiety   . Adenocarcinoma, lung     a. Dx 2013, underwent left lower lobectomy under the care of Dr.Lapunzina in [place] for a stage I non-small cell lung CA.; "went to [facility] ~ 06/2012 and they couldn't find anything" (04/01/2013)  . Pneumonia   . Coronary atherosclerosis of native coronary artery    Past Surgical History  Procedure Laterality Date  . Hip arthroplasty Left 02/2012  . Lung biopsy Left 07/15/2012    "LLL" (04/01/2013)  . Lung removal, partial Left     /notes 02/26/2013  (04/01/2013)   Family History  Problem Relation Age of Onset  . Stroke    . CAD Neg Hx    History   Social History  . Marital Status: Widowed    Spouse Name: N/A    Number of Children: N/A  . Years of Education: N/A   Occupational History  . Not on file.   Social History Main Topics  . Smoking status: Former Smoker -- 0.50 packs/day for 42 years    Types: Cigarettes    Quit date: 02/27/2012  . Smokeless tobacco: Never Used  . Alcohol Use: 29.4 oz/week    14 Cans of beer, 35 Shots of liquor per week     Comment: 04/01/2013 "~ 1/2 pint /day of rum plus 2 16oz beers/day"  . Drug Use: No     Comment: Prior h/o cocaine and heroin. Currently on methadone.  . Sexual Activity: Not Currently   Other Topics Concern  . Not on file   Social History Narrative  . No narrative on file    Review of Systems: Review of Systems  Constitutional: Positive for malaise/fatigue. Negative for fever, chills and diaphoresis.  HENT: Positive for tinnitus. Negative for hearing loss and sore throat.   Eyes: Negative for blurred vision and photophobia.  Respiratory: Positive for cough, hemoptysis (reddish sputum), sputum production and shortness of breath. Negative for wheezing.   Cardiovascular: Negative  for chest pain, palpitations, orthopnea, claudication and leg swelling.  Gastrointestinal: Positive for constipation (last BM 5 days ago). Negative for nausea, abdominal pain and blood in stool. Vomiting: Patient notes regurgitation (  not true vomiting) of food and a globus sensation.  Genitourinary: Negative for dysuria and hematuria.       Difficulty urinating  Musculoskeletal: Positive for falls and joint pain.  Skin: Negative for rash.  Neurological: Positive for dizziness and weakness. Negative for focal weakness and headaches. Speech change: hoarseness. Seizures: Family reports "seizure" / "tremor" episodes that have recently been less frequent, maybe 1-2x/month, during which the patient stops talking and becomes stiff, stares, and trembles; he does not remember these episodes after they happen.   Psychiatric/Behavioral: Positive for depression, hallucinations (Patient reports that sometimes he sees or hears his deceased wife and mother speaking to him but that he knows they are not really there.), memory loss and substance abuse (Drinking 1-2 drinks per day now). Negative for suicidal ideas. The patient has insomnia.     Physical Exam: Blood pressure 120/77, pulse 79, temperature 98.5 F (36.9 C), temperature source Oral, resp. rate 15, SpO2 100.00%.  Physical Exam: GENERAL: in NAD NEURO: Stuporous on initial exam, but 30 mins later arousable and cooperative with exam. Alert and oriented to person, place, and situation. Gives what seems to be a reliable history though with some mild difficulty word finding. Cranial nerves II-XII intact in detail. 5-/5 strength in upper and lower extremities. Normal biceps and achilles reflexes. Downgoing Babinski bilaterally. Slow on rapid alternating motion test with upper extremities as well as on finger to nose, R slower than L. Normal heel to shin. No pronator drift. Stands cautiously; able to take two small, wobbly steps but very unstable gait. Patient  does use a cane at baseline. Equal sensation in face, UE, LE bilaterally. HEENT: Temporal wasting. PERRLA. TMs clear bilaterally with no tenderness in the external auditory canal. Oropharynx without erythema or exudate. HEART: RRR, crisp S1, normal S2, no murmurs, rubs, or gallops.  LUNGS: No breath sounds in LLL. Good air movement in LUL. RLL with very diminished breath sounds. RUL with slightly diminished breath sounds. RML with good air movement. No wheezes, rales, or rhonchi. Normal work of breathing on room air with sat 90%; satting 95% on 2 L. ABDOMEN: NABS. Soft, nontender, and nondistended. EXTREMITIES: Scaly xerosis on bilateral legs and feet. Onychomycosis on toenails bilaterally. Old-appearing petechiae on soles of both feet. No pretibial edema. 1+ distal pulses.  Lab results: Results for orders placed during the hospital encounter of 11/21/13 (from the past 24 hour(s))  CBC     Status: Abnormal   Collection Time    11/21/13  9:42 AM      Result Value Range   WBC 7.9  4.0 - 10.5 K/uL   RBC 4.24  4.22 - 5.81 MIL/uL   Hemoglobin 8.5 (*) 13.0 - 17.0 g/dL   HCT 28.4 (*) 39.0 - 52.0 %   MCV 67.0 (*) 78.0 - 100.0 fL   MCH 20.0 (*) 26.0 - 34.0 pg   MCHC 29.9 (*) 30.0 - 36.0 g/dL   RDW 17.9 (*) 11.5 - 15.5 %   Platelets 319  150 - 400 K/uL  BASIC METABOLIC PANEL     Status: Abnormal   Collection Time    11/21/13  9:42 AM      Result Value Range   Sodium 142  137 - 147 mEq/L   Potassium 4.2  3.7 - 5.3 mEq/L   Chloride 104  96 - 112 mEq/L   CO2 26  19 - 32 mEq/L   Glucose, Bld 82  70 - 99 mg/dL   BUN 17  6 - 23 mg/dL   Creatinine, Ser 1.01  0.50 - 1.35 mg/dL   Calcium 8.5  8.4 - 10.5 mg/dL   GFR calc non Af Amer 77 (*) >90 mL/min   GFR calc Af Amer 89 (*) >90 mL/min  TROPONIN I     Status: None   Collection Time    11/21/13 11:45 AM      Result Value Range   Troponin I <0.30  <0.30 ng/mL  POCT I-STAT TROPONIN I     Status: None   Collection Time    11/21/13 12:00 PM       Result Value Range   Troponin i, poc 0.03  0.00 - 0.08 ng/mL   Comment 3           CG4 I-STAT (LACTIC ACID)     Status: None   Collection Time    11/21/13 12:03 PM      Result Value Range   Lactic Acid, Venous 1.56  0.5 - 2.2 mmol/L  URINALYSIS, ROUTINE W REFLEX MICROSCOPIC     Status: Abnormal   Collection Time    11/21/13 12:36 PM      Result Value Range   Color, Urine AMBER (*) YELLOW   APPearance CLEAR  CLEAR   Specific Gravity, Urine 1.029  1.005 - 1.030   pH 5.5  5.0 - 8.0   Glucose, UA NEGATIVE  NEGATIVE mg/dL   Hgb urine dipstick NEGATIVE  NEGATIVE   Bilirubin Urine SMALL (*) NEGATIVE   Ketones, ur NEGATIVE  NEGATIVE mg/dL   Protein, ur NEGATIVE  NEGATIVE mg/dL   Urobilinogen, UA 2.0 (*) 0.0 - 1.0 mg/dL   Nitrite NEGATIVE  NEGATIVE   Leukocytes, UA NEGATIVE  NEGATIVE     Imaging results:  Dg Chest 2 View  11/21/2013   CLINICAL DATA:  One week history of shortness of breath  EXAM: CHEST  2 VIEW  COMPARISON:  Prior chest x-ray 11/18/2013 ; prior CT chest/abdomen/ pelvis 02/26/2013  FINDINGS: Slightly increased ill-defined patchy airspace opacities predominantly in the right lung base but also in the bilateral mid lungs. A persistent moderate right layering pleural effusion. Also likely a small left pleural effusion versus pleural thickening. Unchanged cardiac and mediastinal contours. Atherosclerotic calcification noted in the transverse aorta. No acute osseous abnormality.  IMPRESSION: 1. Similar to slight interval progression of bilateral patchy airspace disease worst in the right lower lobe. Findings remain concerning for a multifocal infectious/inflammatory process. 2. Small bilateral pleural effusions.   Electronically Signed   By: Jacqulynn Cadet M.D.   On: 11/21/2013 13:07    Other results: EKG: LVH, long QT, otherwise unremarkable  Assessment & Plan by Problem:  Weakness, Confusion, Gait instability: Most likely mild delirium 2/2 recent increase of methadone dose,  now superimposed on concomitant illness and with levofloxacin which can be psychoactive in older or medically frail individuals. We considered CVA given the patient's history but in the absence of focal neurologic deficits and with a waxing and waning course, this is unlikely, particularly with a normal noncontrasted head CT two days ago. Normal pressure hydrocephalus is also in the differential but less likely given waxing and waning course, lack of urinary incontinence. Patient with baseline mental status on exam this evening. - will reduce methadone dose to 50 mg daily - DC levaquin - re-orientation as needed - minimize psychoactive meds - PT/OT to eval - fall precautions  Cough/SOB: will continue treatment for CAP with ceftriaxone + doxycycline given prolonged QT. However, not  clear that this is the only etiology of his symptoms. Patient also has chronic heart failure with systolic and diastolic dysfunction and an EF of 25% in August, so pulmonary edema could be playing a role, although patient actually appears mildly volume depleted on exam. In May 2014 he had a chest CT with bilateral PNA with consolidation on the R and also with bilateral partially loculated pleural effusions; these findings appeared to persist on CXR a month later in June 2014. On 11/19/13 CXR description of bilateral patchy airspace disease worst in RLL with bilateral pleural effusions appears to represent a similar pattern. Most worrying, he has not had the recommended imaging follow up with oncology for his history of lung cancer and appears to never have followed up with Dr. Julien Nordmann after his PET. - HIV ELISA - ceftriaxone 1g IV q24 - doxycycline 100 mg po q12 - chest CT with contrast to assess for bronchial obstruction, loculation of pleural effusion, ?recurrence of malignancy particularly with described right hilar soft tissue fullness on CXR. - respiratory virus panel - urine legionella and strep pneumo antigens -  Duonebs Q6 PRN dyspnea or hypoxia  Dysphagia/Odynophagia: Patient has history of similar dysphagia several years ago that resolved with treatment of his lung cancer. Unclear that these two things are related, however. Differential includes malignant processes vs esophagitis vs esophageal dysmotility or stricture. - SLP speech and swallow study - NPO pending swallow study results - may need outpatient GI follow up for consideration of endoscopy if symptoms persist  Shaking episodes: Per family's description, somewhat concerned that the patient is having complex partial vs simple partial seizures; however, these episodes have been much less frequent lately thus are not likely related to his functional decline. - outpatient neurology follow up  CHF/CAD/HTN: currently stable. Patient does not appear volume overloaded. - aspirin 81 mg daily - hydralazine 10 mg TID - lisinopril 10 mg daily - telemetry - cycling troponins 2/2 c/o chest pain at initial presentation (now resolved)  Urinary hesitancy: patient urinating infrequently due to urinary hesitancy. Had some pelvic adenopathy on his prior PET that was thought to be secondary to his preceding hip surgery. - urinating normally here - follow I/O - likely needs outpatient urology follow up for evaluation of his urinary hesitancy.  Polysubstance use including alcohol, methadone: - continue methadone at reduced dose, 50mg  po daily - will need to touch base with methadone clinic re: dosing on Monday - thiamine 100 mg daily - lorazepam 1mg  po q6 PRN anxiety - CIWA protocol  Anemia: Hgb 8.5, up from 8.2 on 1/22. MCV 67 with polychromasia on smear, strongly suggestive of iron deficiency anemia. - will consider oral iron, but holding off for now pending speech evaluation - a.m. CBC  FEN/GI: - Speech/language pathology formal swallow eval - Low sodium diet - IV fluids at 75 cc/hr - Miralax and docusate for constipation - MVI - am  CMET  PPx: - SQH - ambulation with assistance - aspiration precautions  This is a Careers information officer Note.  The care of the patient was discussed with Dr. Eula Fried and the assessment and plan was formulated with their assistance.  Please see their note for official documentation of the patient encounter.   SignedCorey Skains 11/21/2013, 3:44 PM

## 2013-11-21 NOTE — ED Notes (Signed)
Pt aware of need for urine  

## 2013-11-21 NOTE — Progress Notes (Signed)
Received report from ED by [facility] RN

## 2013-11-21 NOTE — ED Provider Notes (Addendum)
CSN: [id number]     Arrival date & time 11/21/13  0930 History   First MD Initiated Contact with Patient 11/21/13 [phone]     Chief Complaint  Patient presents with  . Cough   (Consider location/radiation/quality/duration/timing/severity/associated sxs/prior Treatment) Patient is a 63 y.o. male presenting with cough. The history is provided by the patient and a caregiver.  Cough Cough characteristics:  Productive Sputum characteristics:  Green Severity:  Severe Onset quality:  Gradual Timing:  Constant Progression:  Worsening Chronicity:  New Smoker: no   Context: upper respiratory infection   Associated symptoms: chest pain, chills, fever and wheezing   Associated symptoms: no rash    patient seen here 2 days ago and treated with Levaquin for community acquired pneumonia. Since that time he is more short of breath. He has been compliant with his medications. He has also had increased weakness with nausea and no vomiting. No diarrhea are appreciated. No syncope or near-syncope. Denies any urinary symptoms. Nothing makes her symptoms worse.  Past Medical History  Diagnosis Date  . Essential hypertension, benign 02/26/2013  . Liver lesion     a. Previously biopsy-proven to be a cyst (per records from a hospital in [place]).  . Hypoxia     a. Adm 02/2013, required home O2.  . Drug abuse   . Depression   . Seizure disorder     "after hip OR 02/2012 once I got home" (04/01/2013)  . GI bleed     a. 2009 at time of stroke - received 3 u blood, no source found per pt  . HTN (hypertension)   . On home oxygen therapy     2.5L "usually use it at night" (04/01/2013)  . Tuberculosis   . Positive TB test   . History of blood transfusion 2009    "don't know what it was related to" (04/01/2013)  . Stroke 2009    a. Pt reports several, last in 2009. b. Residual L sided weakness.  . Anxiety   . Adenocarcinoma, lung     a. Dx 2013, underwent left lower lobectomy under the care of Dr.Lapunzina in [place] for a stage I non-small cell lung CA.; "went to [facility] ~ 06/2012 and they couldn't find anything" (04/01/2013)  . Pneumonia   . Coronary atherosclerosis of native coronary artery    Past Surgical History  Procedure Laterality Date  . Hip arthroplasty Left 02/2012  . Lung biopsy Left 07/15/2012    "LLL" (04/01/2013)  . Lung removal, partial Left     /notes 02/26/2013  (04/01/2013)   Family History  Problem Relation Age of Onset  . Stroke    . CAD Neg Hx    History  Substance Use Topics  . Smoking status: Former Smoker -- 0.50 packs/day for 42 years    Types: Cigarettes    Quit date: 02/27/2012  . Smokeless tobacco: Never Used  . Alcohol Use: 29.4 oz/week    14 Cans of beer, 35 Shots of liquor per week     Comment: 04/01/2013 "~ 1/2 pint /day of rum plus 2 16oz beers/day"    Review of Systems  Constitutional: Positive for fever and chills.  Respiratory: Positive for cough and wheezing.   Cardiovascular: Positive for chest pain.  Skin: Negative for rash.  All other systems reviewed and are negative.    Allergies  Review of patient's allergies indicates no known allergies.  Home Medications   Current Outpatient Rx  Name  Route  Sig  Dispense  Refill  . aspirin EC 81 MG EC tablet   Oral   Take 1 tablet (81 mg total) by mouth daily.         . folic acid (FOLVITE) 1 MG tablet   Oral   Take 1 tablet (1 mg total) by mouth daily.         . hydrALAZINE (APRESOLINE) 10 MG tablet   Oral   Take 1 tablet (10 mg total) by mouth 3 (three) times daily.   90 tablet   1   . levofloxacin (LEVAQUIN) 750 MG tablet   Oral   Take 1 tablet (750 mg total) by mouth daily. X 5 days   7 tablet   0   . lisinopril (PRINIVIL,ZESTRIL) 10 MG tablet   Oral   Take 10 mg by mouth daily.         Marland Kitchen METHADONE HCL PO   Oral   Take 63 mg by mouth daily. As directed         . polyethylene glycol powder (GLYCOLAX/MIRALAX) powder   Oral   Take 17 g by mouth 2 (two) times daily. Until  daily soft stools  OTC   255 g   0   . potassium chloride (K-DUR) 10 MEQ tablet   Oral   Take 1 tablet (10 mEq total) by mouth once.   30 tablet   0    BP 135/102  Pulse 88  Temp(Src) 98.5 F (36.9 C) (Oral)  Resp 20  SpO2 93% Physical Exam  Nursing note and vitals reviewed. Constitutional: He is oriented to person, place, and time. He appears well-developed and well-nourished.  Non-toxic appearance. No distress.  HENT:  Head: Normocephalic and atraumatic.  Eyes: Conjunctivae, EOM and lids are normal. Pupils are equal, round, and reactive to light.  Neck: Normal range of motion. Neck supple. No tracheal deviation present. No mass present.  Cardiovascular: Normal rate, regular rhythm and normal heart sounds.  Exam reveals no gallop.   No murmur heard. Pulmonary/Chest: Effort normal. No stridor. No respiratory distress. He has decreased breath sounds. He has wheezes. He has no rhonchi. He has no rales.  Abdominal: Soft. Normal appearance and bowel sounds are normal. He exhibits no distension. There is no tenderness. There is no rebound and no CVA tenderness.  Musculoskeletal: Normal range of motion. He exhibits no edema and no tenderness.  Neurological: He is alert and oriented to person, place, and time. He has normal strength. No cranial nerve deficit or sensory deficit. GCS eye subscore is 4. GCS verbal subscore is 5. GCS motor subscore is 6.  Skin: Skin is warm and dry. No abrasion and no rash noted.  Psychiatric: His speech is normal and behavior is normal. His affect is blunt.    ED Course  Angiocath insertion Date/Time: 11/21/2013 12:51 PM Performed by: Leota Jacobsen Authorized by: Lacretia Leigh T Consent: Verbal consent obtained. Consent given by: patient Patient identity confirmed: verbally with patient Time out: Immediately prior to procedure a "time out" was called to verify the correct patient, procedure, equipment, support staff and site/side marked as  required. Preparation: Patient was prepped and draped in the usual sterile fashion. Local anesthesia used: no Patient sedated: no Patient tolerance: Patient tolerated the procedure well with no immediate complications.   (including critical care time) Labs Review Labs Reviewed  CULTURE, BLOOD (ROUTINE X 2)  CULTURE, BLOOD (ROUTINE X 2)  CBC  BASIC METABOLIC PANEL  TROPONIN I  URINALYSIS, ROUTINE W  REFLEX MICROSCOPIC   Imaging Review No results found.  EKG Interpretation   None      Date: 11/21/2013  Rate: 88  Rhythm: normal sinus rhythm  QRS Axis: left  Intervals: normal  ST/T Wave abnormalities: nonspecific ST changes  Conduction Disutrbances:none  Narrative Interpretation:   Old EKG Reviewed: none available    MDM  No diagnosis found.  Patient's chest x-ray showed worsening pneumonia. He will be started on antibiotics and admitted to medicine service.    Leota Jacobsen, MD 11/21/13 1252  Leota Jacobsen, MD 11/21/13 1325

## 2013-11-22 LAB — CBC
HEMATOCRIT: 27.5 % — AB (ref 39.0–52.0)
Hemoglobin: 8.2 g/dL — ABNORMAL LOW (ref 13.0–17.0)
MCH: 19.8 pg — AB (ref 26.0–34.0)
MCHC: 29.8 g/dL — AB (ref 30.0–36.0)
MCV: 66.4 fL — ABNORMAL LOW (ref 78.0–100.0)
Platelets: 336 10*3/uL (ref 150–400)
RBC: 4.14 MIL/uL — ABNORMAL LOW (ref 4.22–5.81)
RDW: 17.9 % — AB (ref 11.5–15.5)
WBC: 7.7 10*3/uL (ref 4.0–10.5)

## 2013-11-22 LAB — RAPID URINE DRUG SCREEN, HOSP PERFORMED
AMPHETAMINES: NOT DETECTED
BARBITURATES: NOT DETECTED
Benzodiazepines: NOT DETECTED
Cocaine: NOT DETECTED
Opiates: NOT DETECTED
Tetrahydrocannabinol: NOT DETECTED

## 2013-11-22 LAB — COMPREHENSIVE METABOLIC PANEL
ALK PHOS: 146 U/L — AB (ref 39–117)
ALT: 112 U/L — AB (ref 0–53)
AST: 211 U/L — ABNORMAL HIGH (ref 0–37)
Albumin: 2.5 g/dL — ABNORMAL LOW (ref 3.5–5.2)
BUN: 15 mg/dL (ref 6–23)
CO2: 24 meq/L (ref 19–32)
Calcium: 8.2 mg/dL — ABNORMAL LOW (ref 8.4–10.5)
Chloride: 104 mEq/L (ref 96–112)
Creatinine, Ser: 0.89 mg/dL (ref 0.50–1.35)
GFR calc Af Amer: >90 mL/min (ref >90)
GFR, EST NON AFRICAN AMERICAN: 89 mL/min — AB (ref >90)
Glucose, Bld: 85 mg/dL (ref 70–99)
POTASSIUM: 3.6 meq/L — AB (ref 3.7–5.3)
SODIUM: 141 meq/L (ref 137–147)
Total Bilirubin: 0.7 mg/dL (ref 0.3–1.2)
Total Protein: 7.7 g/dL (ref 6.0–8.3)

## 2013-11-22 LAB — GLUCOSE, CAPILLARY
GLUCOSE-CAPILLARY: 65 mg/dL — AB (ref 70–99)
GLUCOSE-CAPILLARY: 79 mg/dL (ref 70–99)
Glucose-Capillary: 123 mg/dL — ABNORMAL HIGH (ref 70–99)
Glucose-Capillary: 70 mg/dL (ref 70–99)
Glucose-Capillary: 91 mg/dL (ref 70–99)

## 2013-11-22 LAB — HEPATITIS PANEL, ACUTE
HCV Ab: REACTIVE — AB
Hep A IgM: NONREACTIVE
Hep B C IgM: NONREACTIVE
Hepatitis B Surface Ag: NEGATIVE

## 2013-11-22 LAB — HIV ANTIBODY (ROUTINE TESTING W REFLEX): HIV: NONREACTIVE

## 2013-11-22 LAB — MAGNESIUM: Magnesium: 1.9 mg/dL (ref 1.5–2.5)

## 2013-11-22 LAB — TROPONIN I: Troponin I: <0.3 ng/mL (ref <0.30)

## 2013-11-22 LAB — STREP PNEUMONIAE URINARY ANTIGEN: STREP PNEUMO URINARY ANTIGEN: NEGATIVE

## 2013-11-22 MED ORDER — DEXTROSE 50 % IV SOLN
INTRAVENOUS | Status: AC
Start: 2013-11-22 — End: 2013-11-22
  Filled 2013-11-22: qty 50

## 2013-11-22 MED ORDER — POTASSIUM CHLORIDE 10 MEQ/100ML IV SOLN
10.0000 meq | INTRAVENOUS | Status: AC
  Administered 2013-11-22 (×2): 10 meq via INTRAVENOUS
  Filled 2013-11-22 (×2): qty 100

## 2013-11-22 MED ORDER — DEXTROSE 50 % IV SOLN
25.0000 mL | Freq: Once | INTRAVENOUS | Status: AC | PRN
  Administered 2013-11-22: 25 mL via INTRAVENOUS

## 2013-11-22 MED ORDER — PIPERACILLIN-TAZOBACTAM 3.375 G IVPB
3.3750 g | Freq: Three times a day (TID) | INTRAVENOUS | Status: DC
  Administered 2013-11-22 – 2013-11-27 (×14): 3.375 g via INTRAVENOUS
  Filled 2013-11-22 (×17): qty 50

## 2013-11-22 MED ORDER — DOCUSATE SODIUM 50 MG/5ML PO LIQD
50.0000 mg | Freq: Every day | ORAL | Status: DC
  Filled 2013-11-22 (×2): qty 10

## 2013-11-22 MED ORDER — CHLORHEXIDINE GLUCONATE 0.12 % MT SOLN
15.0000 mL | Freq: Two times a day (BID) | OROMUCOSAL | Status: DC
  Administered 2013-11-22 – 2013-11-27 (×10): 15 mL via OROMUCOSAL
  Filled 2013-11-22 (×15): qty 15

## 2013-11-22 MED ORDER — CLONIDINE HCL 0.2 MG/24HR TD PTWK
0.2000 mg | MEDICATED_PATCH | TRANSDERMAL | Status: DC
  Administered 2013-11-22: 0.2 mg via TRANSDERMAL
  Filled 2013-11-22: qty 1

## 2013-11-22 MED ORDER — BIOTENE DRY MOUTH MT LIQD
15.0000 mL | Freq: Two times a day (BID) | OROMUCOSAL | Status: DC
  Administered 2013-11-22 – 2013-11-27 (×8): 15 mL via OROMUCOSAL

## 2013-11-22 MED ORDER — DEXTROSE-NACL 5-0.45 % IV SOLN
INTRAVENOUS | Status: DC
  Administered 2013-11-22: 14:00:00 via INTRAVENOUS

## 2013-11-22 MED ORDER — POTASSIUM CHLORIDE CRYS ER 20 MEQ PO TBCR
40.0000 meq | EXTENDED_RELEASE_TABLET | Freq: Once | ORAL | Status: DC
  Filled 2013-11-22: qty 2

## 2013-11-22 NOTE — Progress Notes (Signed)
ANTIBIOTIC CONSULT NOTE - INITIAL  Pharmacy Consult for Zosyn Indication: pneumonia multifocal per CT  No Known Allergies  Patient Measurements: Height: 5\' 11"  (180.3 cm) Weight: 154 lb 8.7 oz (70.1 kg) IBW/kg (Calculated) : 75.3   Vital Signs: Temp: 97.5 F (36.4 C) (01/25 1326) Temp src: Oral (01/25 1326) BP: 180/120 mmHg (01/25 1326) Pulse Rate: 94 (01/25 1326) Intake/Output from previous day: 01/24 0701 - 01/25 0700 In: 950 [I.V.:950] Out: 275 [Urine:275] Intake/Output from this shift: Total I/O In: -  Out: 150 [Urine:150]  Labs:  Recent Labs  11/21/13 0942 11/22/13 0112  WBC 7.9 7.7  HGB 8.5* 8.2*  PLT 319 336  CREATININE 1.01 0.89   Estimated Creatinine Clearance: 84.2 ml/min (by C-G formula based on Cr of 0.89). No results found for this basename: VANCOTROUGH, VANCOPEAK, VANCORANDOM, [place], [place], [place], [place], [place], TOBRARND, AMIKACINPEAK, AMIKACINTROU, AMIKACIN,  in the last 72 hours   Microbiology: Recent Results (from the past 720 hour(s))  CULTURE, BLOOD (ROUTINE X 2)     Status: None   Collection Time    11/21/13 11:45 AM      Result Value Range Status   Specimen Description BLOOD   Final   Special Requests     Final   Value: BOTTLES DRAWN AEROBIC AND ANAEROBIC 5C FROM RIGHT EJ   Culture  Setup Time     Final   Value: 11/21/2013 18:05     Performed at [facility]   Culture     Final   Value:        BLOOD CULTURE RECEIVED NO GROWTH TO DATE CULTURE WILL BE HELD FOR 5 DAYS BEFORE ISSUING A FINAL NEGATIVE REPORT     Performed at [facility]   Report Status PENDING   Incomplete  CULTURE, BLOOD (ROUTINE X 2)     Status: None   Collection Time    11/21/13 11:45 AM      Result Value Range Status   Specimen Description BLOOD   Final   Special Requests BOTTLES DRAWN AEROBIC ONLY 5CC FROM RIGHT EJ   Final   Culture  Setup Time     Final   Value: 11/21/2013 18:05     Performed at [facility]   Culture     Final   Value:        BLOOD CULTURE RECEIVED NO GROWTH TO DATE CULTURE WILL BE HELD FOR 5 DAYS BEFORE ISSUING A FINAL NEGATIVE REPORT     Performed at [facility]   Report Status PENDING   Incomplete    Medical History: Past Medical History  Diagnosis Date  . Essential hypertension, benign 02/26/2013  . Liver lesion     a. Previously biopsy-proven to be a cyst (per records from a hospital in [place]).  . Hypoxia     a. Adm 02/2013, required home O2.  . Drug abuse   . Depression   . Seizure disorder     "after hip OR 02/2012 once I got home" (04/01/2013)  . GI bleed     a. 2009 at time of stroke - received 3 u blood, no source found per pt  . HTN (hypertension)   . On home oxygen therapy     2.5L "usually use it at night" (04/01/2013)  . Tuberculosis   . Positive TB test   . History of blood transfusion 2009    "don't know what it was related to" (04/01/2013)  . Stroke 2009    a.  Pt reports several, last in 2009. b. Residual L sided weakness.  . Anxiety   . Adenocarcinoma, lung     a. Dx 2013, underwent left lower lobectomy under the care of Dr.Lapunzina in [place] for a stage I non-small cell lung CA.; "went to [facility] ~ 06/2012 and they couldn't find anything" (04/01/2013)  . Pneumonia   . Coronary atherosclerosis of native coronary artery      Assessment: 62yom with Hx lung Ca s/p lobectomy admitted with multifocal pna per chest CT.  Afebrile, WBC wnl, CrCl > 87ml/min.  Begin Zosyn for possible aspiration.    Goal of Therapy:  irradication of infection  Plan:  Zosyn 3.375 GM IV q8 EI    Bonnita Nasuti Pharm.D. CPP, BCPS Clinical Pharmacist [phone] 11/22/2013 2:06 PM

## 2013-11-22 NOTE — Progress Notes (Signed)
Utilization Review Completed.   Laryn Venning, RN, BSN Nurse Case Manager  

## 2013-11-22 NOTE — Evaluation (Addendum)
Clinical/Bedside Swallow Evaluation Patient Details  Name: Mark [name] MRN: [id number] Date of Birth: 09-03-1951  Today's Date: 11/22/2013 Time: 1100-1130 SLP Time Calculation (min): 30 min  Past Medical History:  Past Medical History  Diagnosis Date  . Essential hypertension, benign 02/26/2013  . Liver lesion     a. Previously biopsy-proven to be a cyst (per records from a hospital in [place]).  . Hypoxia     a. Adm 02/2013, required home O2.  . Drug abuse   . Depression   . Seizure disorder     "after hip OR 02/2012 once I got home" (04/01/2013)  . GI bleed     a. 2009 at time of stroke - received 3 u blood, no source found per pt  . HTN (hypertension)   . On home oxygen therapy     2.5L "usually use it at night" (04/01/2013)  . Tuberculosis   . Positive TB test   . History of blood transfusion 2009    "don't know what it was related to" (04/01/2013)  . Stroke 2009    a. Pt reports several, last in 2009. b. Residual L sided weakness.  . Anxiety   . Adenocarcinoma, lung     a. Dx 2013, underwent left lower lobectomy under the care of Dr.Lapunzina in [place] for a stage I non-small cell lung CA.; "went to [facility] ~ 06/2012 and they couldn't find anything" (04/01/2013)  . Pneumonia   . Coronary atherosclerosis of native coronary artery    Past Surgical History:  Past Surgical History  Procedure Laterality Date  . Hip arthroplasty Left 02/2012  . Lung biopsy Left 07/15/2012    "LLL" (04/01/2013)  . Lung removal, partial Left     /notes 02/26/2013  (04/01/2013)   HPI:  This is a 63 year old man with a PMH of L lung adenocarcinoma s/p partial lobectomy without chemo/radiation in 2009 and history of TB, as well as multiple lacunar strokes with mild vascular dementia, CAD, HTN, previous GI bleed, depression and anxiety, and polysubstance abuse now on methadone, who presents with his daughter and son-in-law complaining of progressively worsening generalized weakness, sleep difficulty, and  waxing and waning confusion and gait instability for the past two weeks. The history is per the patient and his family members. These symptoms have been associated with a cough productive of green and reddish sputum and mild shortness of breath at night over the same time period.  BSE ordered to assess risk for aspiration and recommend safest, PO diet.    Assessment / Plan / Recommendation Clinical Impression  BSE completed but limited to ice chips, thin liquid, and puree due to patient requrgitating trials.  Oropharyngeal swallow slightly delayed but appears functional with adequate laryngeal elevation.  S/s more suspected primary esophageal based dysphagia.  Observed RN administering medication with patient reporting globus sensation requiring several sips of water to complete transit.  Appeared to tolerate medication.  Recommend NPO status with exception of necessary medication s/p oral care.  Patient to benefit from GI consult to further assess esophageal functioning.  ST to f/u after results of GI consult to determine safest, PO diet.    Aspiration Risk  Moderate    Diet Recommendation NPO except meds   Liquid Administration via: Cup Medication Administration: Whole meds with liquid    Other  Recommendations Recommended Consults: Consider GI evaluation Oral Care Recommendations: Oral care Q4 per protocol   Follow Up Recommendations   (TBD)    Frequency and  Duration min 2x/week  2 weeks       SLP Swallow Goals Please refer to Care Plans  Swallow Study Prior Functional Status   Tolerated regular consistency and thin liquids     General Date of Onset: 11/21/13 HPI: This is a 63 year old man with a PMH of L lung adenocarcinoma s/p partial lobectomy without chemo/radiation in 2009 and history of TB, as well as multiple lacunar strokes with mild vascular dementia, CAD, HTN, previous GI bleed, depression and anxiety, and polysubstance abuse now on methadone, who presents with his  daughter and son-in-law complaining of progressively worsening generalized weakness, sleep difficulty, and waxing and waning confusion and gait instability for the past two weeks. The history is per the patient and his family members. These symptoms have been associated with a cough productive of green and reddish sputum and mild shortness of breath at night over the same time period.  Type of Study: Bedside swallow evaluation Diet Prior to this Study: NPO Temperature Spikes Noted: No Respiratory Status: Nasal cannula History of Recent Intubation: No Behavior/Cognition: Alert;Cooperative;Confused;Pleasant mood Oral Cavity - Dentition: Dentures, top;Dentures, bottom Self-Feeding Abilities: Able to feed self Patient Positioning: Upright in bed Baseline Vocal Quality: Clear Volitional Cough: Strong;Wet Volitional Swallow: Able to elicit    Oral/Motor/Sensory Function Overall Oral Motor/Sensory Function: Impaired at baseline Labial ROM: Reduced left Labial Symmetry: Abnormal symmetry left Labial Strength: Reduced Labial Sensation: Reduced Lingual ROM: Reduced left Lingual Symmetry: Abnormal symmetry left Lingual Strength: Reduced Lingual Sensation: Reduced Facial ROM: Reduced left Facial Strength: Reduced Facial Sensation: Reduced Velum: Within Functional Limits Mandible: Within Functional Limits   Ice Chips Ice chips: Within functional limits   Thin Liquid Thin Liquid: Within functional limits Presentation: Cup;Spoon    Nectar Thick Nectar Thick Liquid: Not tested   Honey Thick Honey Thick Liquid: Not tested   Puree Puree: Within functional limits   Solid   GO    Solid: Not tested      Sharman Crate [place], CCC-SLP [phone] [facility] 11/22/2013,12:45 PM

## 2013-11-22 NOTE — Progress Notes (Signed)
  I have seen and examined the patient, and reviewed the daily progress note by Corey Skains, MS 4 and discussed the care of the patient with them. Please see my progress note from 11/22/2013 for further details regarding assessment and plan.    Signed:  Jerene Pitch, MD 11/22/2013, 2:57 PM

## 2013-11-22 NOTE — Evaluation (Signed)
Physical Therapy Evaluation Patient Details Name: Mark [name] MRN: [id number] DOB: [birth year] Today's Date: 11/22/2013 Time: 5053-9767 PT Time Calculation (min): 30 min  PT Assessment / Plan / Recommendation History of Present Illness  HPI: This is a 63 year old man with a PMH of L lung adenocarcinoma s/p partial lobectomy without chemo/radiation in 2009 and history of TB, as well as multiple lacunar strokes with mild vascular dementia, CAD, HTN, previous GI bleed, depression and anxiety, and polysubstance abuse now on methadone, who presents with his daughter and son-in-law complaining of progressively worsening generalized weakness, sleep difficulty, and waxing and waning confusion and gait instability for the past two weeks. The history is per the patient and his family members. These symptoms have been associated with a cough productive of green and reddish sputum and mild shortness of breath at night over the same time period.      Clinical Impression  Pt admitted with pneumonia. Pt currently with functional limitations due to the deficits listed below (see PT Problem List).  Pt will benefit from skilled PT to increase their independence and safety with mobility to allow discharge to the venue listed below.      PT Assessment  Patient needs continued PT services    Follow Up Recommendations  CIR;SNF    Does the patient have the potential to tolerate intense rehabilitation      Barriers to Discharge Decreased caregiver support;Inaccessible home environment Need mroe info re: home situation    Equipment Recommendations  3in1 (PT)    Recommendations for Other Services Rehab consult;OT consult   Frequency Min 3X/week    Precautions / Restrictions Precautions Precautions: Fall   Pertinent Vitals/Pain no apparent distress Session conducted on supplemental O2       Mobility  Bed Mobility Overal bed mobility: Needs Assistance;+ 2 for safety/equipment Bed Mobility: Supine to  Sit Supine to sit: Mod assist General bed mobility comments: Slow movig; requriing assist to elevate trunk from semi-sidelying to upright Transfers Overall transfer level: Needs assistance Equipment used: Rolling walker (2 wheeled) Transfers: Sit to/from Stand Sit to Stand: Mod assist General transfer comment: Mod assist to power-up, especially from low toilet; Cues for safety, hand placement and technique Ambulation/Gait Ambulation/Gait assistance: Min assist Ambulation Distance (Feet): 20 Feet Assistive device: Rolling walker (2 wheeled) Gait Pattern/deviations: Decreased stride length General Gait Details: Cues for safety and RW proximity and upright posture; Tending to keep RW too far in front of him, predisposing him to falls    Exercises     PT Diagnosis: Difficulty walking;Generalized weakness  PT Problem List: Decreased strength;Decreased range of motion;Decreased activity tolerance;Decreased balance;Decreased mobility;Decreased coordination;Decreased cognition;Decreased knowledge of use of DME;Decreased safety awareness PT Treatment Interventions: DME instruction;Gait training;Stair training;Functional mobility training;Therapeutic activities;Therapeutic exercise;Balance training;Neuromuscular re-education;Patient/family education     PT Goals(Current goals can be found in the care plan section) Acute Rehab PT Goals Patient Stated Goal: did not state PT Goal Formulation: With patient Time For Goal Achievement: 12/06/13 Potential to Achieve Goals: Good  Visit Information  Last PT Received On: 11/22/13 Assistance Needed: +1 History of Present Illness: HPI: This is a 63 year old man with a PMH of L lung adenocarcinoma s/p partial lobectomy without chemo/radiation in 2009 and history of TB, as well as multiple lacunar strokes with mild vascular dementia, CAD, HTN, previous GI bleed, depression and anxiety, and polysubstance abuse now on methadone, who presents with his daughter  and son-in-law complaining of progressively worsening generalized weakness, sleep difficulty, and waxing and  waning confusion and gait instability for the past two weeks. The history is per the patient and his family members. These symptoms have been associated with a cough productive of green and reddish sputum and mild shortness of breath at night over the same time period.         Prior [place] expects to be discharged to:: Private residence Living Arrangements: Other (Comment) (Need clarification; did not indicate he has a spouse on eval) Available Help at Discharge: Family;Available PRN/intermittently (Daughter lives "across the hall") Type of Home: Apartment Home Access: Stairs to enter [employer] of Steps: flight (there may be a back entrance without rails) Entrance Stairs-Rails: Right Home Layout: One level Home Equipment: Walker - 2 wheels;Cane - single point Additional Comments: At times pt is difficult to understand; he did not mention a spouse to this therapist on PT eval; He indicated he has given care to his mother until her death, which was within the past few weeks according to pt; Will need clarification on all aspects of his home environment, including available assist Prior Function Level of Independence: Needs assistance Gait / Transfers Assistance Needed: with cane Communication Communication: Expressive difficulties;Other (comment) (difficult to understand)    Cognition  Cognition Arousal/Alertness: Awake/alert Behavior During Therapy: WFL for tasks assessed/performed Overall Cognitive Status: No family/caregiver present to determine baseline cognitive functioning    Extremity/Trunk Assessment Upper Extremity Assessment Upper Extremity Assessment: Defer to OT evaluation Lower Extremity Assessment Lower Extremity Assessment: Generalized weakness   Balance Balance Overall balance assessment: Needs  assistance Sitting-balance support: Bilateral upper extremity supported Sitting balance-Leahy Scale: Good Standing balance support: Bilateral upper extremity supported Standing balance-Leahy Scale: Fair  End of Session PT - End of Session Equipment Utilized During Treatment: Oxygen Activity Tolerance: Patient tolerated treatment well Patient left: in bed;with call bell/phone within reach;with family/visitor present Nurse Communication: Mobility status  GP     Mark [name], [place]  11/22/2013, 7:52 PM

## 2013-11-22 NOTE — H&P (Signed)
Internal Medicine Attending Admission Note Date: 11/22/2013  Patient name: Mark [name] Medical record number: [id number] Date of birth: 1951/08/09 Age: 63 y.o. Gender: male  I saw and evaluated the patient. I reviewed the resident's note and I agree with the resident's findings and plan as documented in the resident's note, with the following additional comments.  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  Patient also complains of food sticking in his mid-chest area when he swallows.   Physical Exam - key components related to admission:  Filed Vitals:   11/22/13 0431 11/22/13 0850 11/22/13 0857 11/22/13 1326  BP: 174/111 165/115 165/110 180/120  Pulse: 86 90  94  Temp: 98.2 F (36.8 C)  96.6 F (35.9 C) 97.5 F (36.4 C)  TempSrc: Oral  Axillary Oral  Resp: 18 18  18   Height:      Weight:      SpO2: 100%  100% 97%   General: Alert, no acute distress; oriented x3 Lungs: Decreased breath sounds in right base; bibasilar crackles Heart: Regular; no extra sounds or murmurs Abdomen: Bowel sounds present, soft, nontender Extremities: No edema   Lab results:   Basic Metabolic Panel:  Recent Labs  11/21/13 0942 11/22/13 0112 11/22/13 1000  NA 142 141  --   K 4.2 3.6*  --   CL 104 104  --   CO2 26 24  --   GLUCOSE 82 85  --   BUN 17 15  --   CREATININE 1.01 0.89  --   CALCIUM 8.5 8.2*  --   MG  --   --   1.9    Liver Function Tests:  Recent Labs  11/22/13 0112  AST 211*  ALT 112*  ALKPHOS 146*  BILITOT 0.7  PROT 7.7  ALBUMIN 2.5*     CBC:  Recent Labs  11/21/13 0942 11/22/13 0112  WBC 7.9 7.7  HGB 8.5* 8.2*  HCT 28.4* 27.5*  MCV 67.0* 66.4*  PLT 319 336    Recent Labs  11/21/13 0942  NEUTROABS 5.9  LYMPHSABS 1.3  MONOABS 0.6  EOSABS 0.1  BASOSABS 0.0    Cardiac Enzymes:  Recent Labs  11/21/13 1145 11/22/13 0112  TROPONINI <0.30 <0.30      CBG:  Recent Labs  11/21/13 2141 11/22/13 0745 11/22/13 0828 11/22/13 1230  GLUCAP 104* 65* 123* 70      Urine Drug Screen: Drugs of Abuse     Component Value Date/Time   LABOPIA NONE DETECTED 11/22/2013 0702   COCAINSCRNUR NONE DETECTED 11/22/2013 0702   LABBENZ NONE DETECTED 11/22/2013 0702   AMPHETMU NONE DETECTED 11/22/2013 0702   THCU NONE DETECTED 11/22/2013 0702   LABBARB NONE DETECTED 11/22/2013 0702       Urinalysis    Component Value Date/Time   COLORURINE AMBER* 11/21/2013 1236   APPEARANCEUR CLEAR 11/21/2013 1236   LABSPEC 1.029 11/21/2013 1236   PHURINE 5.5 11/21/2013 1236   GLUCOSEU NEGATIVE 11/21/2013 1236   HGBUR NEGATIVE 11/21/2013 1236   BILIRUBINUR SMALL*  11/21/2013 1236   [place] 11/21/2013 1236   PROTEINUR NEGATIVE 11/21/2013 1236   UROBILINOGEN 2.0* 11/21/2013 1236   NITRITE NEGATIVE 11/21/2013 1236   LEUKOCYTESUR NEGATIVE 11/21/2013 1236     Imaging results:  Dg Chest 2 View  11/21/2013   CLINICAL DATA:  One week history of shortness of breath  EXAM: CHEST  2 VIEW  COMPARISON:  Prior chest x-ray 11/18/2013 ; prior CT chest/abdomen/ pelvis 02/26/2013  FINDINGS: Slightly increased ill-defined patchy airspace opacities predominantly in the right lung base but also in the bilateral mid lungs. A persistent moderate right layering pleural effusion. Also likely a small left pleural effusion versus pleural thickening. Unchanged cardiac and mediastinal contours. Atherosclerotic  calcification noted in the transverse aorta. No acute osseous abnormality.  IMPRESSION: 1. Similar to slight interval progression of bilateral patchy airspace disease worst in the right lower lobe. Findings remain concerning for a multifocal infectious/inflammatory process. 2. Small bilateral pleural effusions.   Electronically Signed   By: Jacqulynn Cadet M.D.   On: 11/21/2013 13:07   Ct Chest W Contrast  11/21/2013   CLINICAL DATA:  Known pneumonia, with productive cough and generalized weakness.  EXAM: CT CHEST WITH CONTRAST  TECHNIQUE: Multidetector CT imaging of the chest was performed during intravenous contrast administration.  CONTRAST:  43mL OMNIPAQUE IOHEXOL 300 MG/ML  SOLN  COMPARISON:  Chest radiograph performed earlier today at 12:07 p.m., and CT of the chest performed 02/26/2013  FINDINGS: Hazy peribronchial opacities are seen bilaterally, more prominent at the right lung base, with associated bronchiectasis. More peripheral opacities are seen within the left lung. Small to moderate right and small left pleural effusions are seen, mildly loculated on the left. There is associated scarring at the lung bases, with scattered blebs seen at the lung apices. Some of the opacities are relatively nodular in appearance. A 4 mm nodule is seen in the left lower lobe (image 46 of 69). There is no evidence of pneumothorax.  Scattered coronary artery calcifications are seen. The esophagus is partially filled with fluid, raising concern for esophageal dysmotility. Mildly enlarged right paratracheal, subcarinal and azygoesophageal recess nodes are seen, measuring up to 1.3 cm in short axis. Trace pericardial fluid remains within normal limits. The great vessels are grossly unremarkable in appearance. The thyroid gland is unremarkable in appearance. No axillary lymphadenopathy is seen.  A 2.9 x 1.2 cm collection of fluid attenuation along the anterior aspect of the liver is only minimally changed from prior study,  and is likely benign. Trace fluid is seen tracking about the liver. The visualized portions of the liver and spleen are otherwise unremarkable.  IMPRESSION: 1. Hazy bilateral peribronchial opacities, more prominent at the right lung base, with associated bronchiectasis. More peripheral opacities seen within the left lung. Small to moderate right and small left pleural effusions seen, mildly loculated on left. This raises concern for multifocal pneumonia, possibly atypical in nature. 2. Scarring at the lung bases, with scattered blebs at the lung apices. 4 mm nodule at the left lower lung lobe; this region was not well assessed on prior studies due to airspace opacification. If the patient is at high risk for bronchogenic carcinoma, follow-up chest CT at 1 year is recommended. If the patient is at low risk, no follow-up is needed. This recommendation follows the consensus statement: Guidelines for Management of Small Pulmonary Nodules Detected on CT Scans: A Statement from the [place] as published in Radiology 2005; 237:395-400. 3. Scattered coronary artery calcifications seen. 4. Esophagus  partially filled with fluid, raising concern for esophageal dysmotility. 5. Mildly enlarged mediastinal nodes seen, measuring up to 1.3 cm in short axis. Several of these are similar in size on the prior study, and are nonspecific in appearance. 6. 2.9 x 1.2 cm collection of fluid attenuation along the anterior aspect of the liver is only minimally changed from the prior study, and likely reflects a cyst. 7. Trace ascites noted about the liver.   Electronically Signed   By: Garald Balding M.D.   On: 11/21/2013 21:43    Other results: EKG 1/24: Sinus rhythm; probable left atrial enlargement; left ventricular hypertrophy; anterior Q waves, possibly due to LVH; prolonged QT interval (579 ms)  EKG 1/25: Normal sinus rhythm; possible Left atrial enlargement; left ventricular hypertrophy with repolarization;  abnormality; cannot rule out Septal infarct , age undetermined; prolonged QT (537 ms)  Assessment & Plan by Problem:  1.  Pulmonary opacities with bilateral pleural effusions by chest CT scan.  Patient has bilateral peribronchial pulmonary opacities and bilateral pleural effusions by chest CT scan, concerning for pneumonia.  He is at risk for aspiration given his dysphagia, and this may be a contributing factor.  Other possibilities include atypical pneumonia or recurrence of lung cancer.  A PET scan done 03/17/2013 (ordered by oncologist Dr. Earlie Server) showed a hypermetabolic left pulmonary process favored to represent an infectious process including atypical etiologies; recurrent or metachronous tumor could not be entirely excluded but was felt to be less likely.  Plan is culture blood and sputum; empiric IV Zosyn; diagnostic thoracentesis by interventional radiology; pulmonary consultation.  2. Dysphagia.  Bedside swallowing evaluation today showed esophageal based dysphagia.  Plan is n.p.o.; barium swallow; GI consult.     3.  Altered mental status.  This may be due to medical illness, with contribution from medications including methadone.  Patient's mental status is clear today.  4.  Other problems and plans as per the resident physician's note.

## 2013-11-22 NOTE — Progress Notes (Addendum)
Subjective: Mr. Cafiero was seen and examined at bedside. He is complaining of thirst, dry lips, and chronic left hip pain.   Objective: Vital signs in last 24 hours: Filed Vitals:   11/22/13 0422 11/22/13 0431 11/22/13 0850 11/22/13 0857  BP:  174/111 165/115 165/110  Pulse:  86 90   Temp:  98.2 F (36.8 C)  96.6 F (35.9 C)  TempSrc:  Oral  Axillary  Resp:  18 18   Height:      Weight: 154 lb 8.7 oz (70.1 kg)     SpO2:  100%  100%   Weight change:   Intake/Output Summary (Last 24 hours) at 11/22/13 1141 Last data filed at 11/22/13 0914  Gross per 24 hour  Intake    950 ml  Output    425 ml  Net    525 ml   Vitals reviewed. General: resting in bed, NAD HEENT: PERRL, EOMI Cardiac: RRR Pulm: b/l mid to basilar crackles, decreased breath sounds bases Abd: soft, nontender, nondistended, BS present Ext: warm and well perfused, no pedal edema, darkened petechia on plantar surface of b/l feet, palpable crepitus like feel on left hip/upper thigh but non-tender to deep palpation Neuro: alert and oriented X3, cranial nerves grossly intact, strength equal in bilateral upper and lower extremities  Lab Results: Basic Metabolic Panel:  Recent Labs Lab 11/21/13 0942 11/22/13 0112  NA 142 141  K 4.2 3.6*  CL 104 104  CO2 26 24  GLUCOSE 82 85  BUN 17 15  CREATININE 1.01 0.89  CALCIUM 8.5 8.2*   Liver Function Tests:  Recent Labs Lab 11/22/13 0112  AST 211*  ALT 112*  ALKPHOS 146*  BILITOT 0.7  PROT 7.7  ALBUMIN 2.5*    Recent Labs Lab 11/19/13 0139  AMMONIA 35   CBC:  Recent Labs Lab 11/21/13 0942 11/22/13 0112  WBC 7.9 7.7  NEUTROABS 5.9  --   HGB 8.5* 8.2*  HCT 28.4* 27.5*  MCV 67.0* 66.4*  PLT 319 336   Cardiac Enzymes:  Recent Labs Lab 11/21/13 1145 11/22/13 0112  TROPONINI <0.30 <0.30   BNP:  Recent Labs Lab 11/18/13 2303  PROBNP 1975.0*   CBG:  Recent Labs Lab 11/21/13 2141 11/22/13 0745 11/22/13 0828  GLUCAP 104* 65*  123*   Urine Drug Screen: Drugs of Abuse     Component Value Date/Time   LABOPIA NONE DETECTED 11/22/2013 0702   COCAINSCRNUR NONE DETECTED 11/22/2013 0702   LABBENZ NONE DETECTED 11/22/2013 0702   AMPHETMU NONE DETECTED 11/22/2013 0702   THCU NONE DETECTED 11/22/2013 0702   LABBARB NONE DETECTED 11/22/2013 0702    Alcohol Level:  Recent Labs Lab 11/19/13 0139  ETH <11   Urinalysis:  Recent Labs Lab 11/21/13 1236  COLORURINE AMBER*  LABSPEC 1.029  PHURINE 5.5  GLUCOSEU NEGATIVE  HGBUR NEGATIVE  BILIRUBINUR SMALL*  KETONESUR NEGATIVE  PROTEINUR NEGATIVE  UROBILINOGEN 2.0*  NITRITE NEGATIVE  LEUKOCYTESUR NEGATIVE   Studies/Results: Dg Chest 2 View  11/21/2013   CLINICAL DATA:  One week history of shortness of breath  EXAM: CHEST  2 VIEW  COMPARISON:  Prior chest x-ray 11/18/2013 ; prior CT chest/abdomen/ pelvis 02/26/2013  FINDINGS: Slightly increased ill-defined patchy airspace opacities predominantly in the right lung base but also in the bilateral mid lungs. A persistent moderate right layering pleural effusion. Also likely a small left pleural effusion versus pleural thickening. Unchanged cardiac and mediastinal contours. Atherosclerotic calcification noted in the transverse aorta. No acute osseous  abnormality.  IMPRESSION: 1. Similar to slight interval progression of bilateral patchy airspace disease worst in the right lower lobe. Findings remain concerning for a multifocal infectious/inflammatory process. 2. Small bilateral pleural effusions.   Electronically Signed   By: Jacqulynn Cadet M.D.   On: 11/21/2013 13:07   Ct Chest W Contrast  11/21/2013   CLINICAL DATA:  Known pneumonia, with productive cough and generalized weakness.  EXAM: CT CHEST WITH CONTRAST  TECHNIQUE: Multidetector CT imaging of the chest was performed during intravenous contrast administration.  CONTRAST:  13mL OMNIPAQUE IOHEXOL 300 MG/ML  SOLN  COMPARISON:  Chest radiograph performed earlier today at  12:07 p.m., and CT of the chest performed 02/26/2013  FINDINGS: Hazy peribronchial opacities are seen bilaterally, more prominent at the right lung base, with associated bronchiectasis. More peripheral opacities are seen within the left lung. Small to moderate right and small left pleural effusions are seen, mildly loculated on the left. There is associated scarring at the lung bases, with scattered blebs seen at the lung apices. Some of the opacities are relatively nodular in appearance. A 4 mm nodule is seen in the left lower lobe (image 46 of 69). There is no evidence of pneumothorax.  Scattered coronary artery calcifications are seen. The esophagus is partially filled with fluid, raising concern for esophageal dysmotility. Mildly enlarged right paratracheal, subcarinal and azygoesophageal recess nodes are seen, measuring up to 1.3 cm in short axis. Trace pericardial fluid remains within normal limits. The great vessels are grossly unremarkable in appearance. The thyroid gland is unremarkable in appearance. No axillary lymphadenopathy is seen.  A 2.9 x 1.2 cm collection of fluid attenuation along the anterior aspect of the liver is only minimally changed from prior study, and is likely benign. Trace fluid is seen tracking about the liver. The visualized portions of the liver and spleen are otherwise unremarkable.  IMPRESSION: 1. Hazy bilateral peribronchial opacities, more prominent at the right lung base, with associated bronchiectasis. More peripheral opacities seen within the left lung. Small to moderate right and small left pleural effusions seen, mildly loculated on left. This raises concern for multifocal pneumonia, possibly atypical in nature. 2. Scarring at the lung bases, with scattered blebs at the lung apices. 4 mm nodule at the left lower lung lobe; this region was not well assessed on prior studies due to airspace opacification. If the patient is at high risk for bronchogenic carcinoma, follow-up  chest CT at 1 year is recommended. If the patient is at low risk, no follow-up is needed. This recommendation follows the consensus statement: Guidelines for Management of Small Pulmonary Nodules Detected on CT Scans: A Statement from the [place] as published in Radiology 2005; 237:395-400. 3. Scattered coronary artery calcifications seen. 4. Esophagus partially filled with fluid, raising concern for esophageal dysmotility. 5. Mildly enlarged mediastinal nodes seen, measuring up to 1.3 cm in short axis. Several of these are similar in size on the prior study, and are nonspecific in appearance. 6. 2.9 x 1.2 cm collection of fluid attenuation along the anterior aspect of the liver is only minimally changed from the prior study, and likely reflects a cyst. 7. Trace ascites noted about the liver.   Electronically Signed   By: Garald Balding M.D.   On: 11/21/2013 21:43   Medications: I have reviewed the patient's current medications. Scheduled Meds: . antiseptic oral rinse  15 mL Mouth Rinse q12n4p  . aspirin EC  81 mg Oral Daily  . cefTRIAXone (ROCEPHIN)  IV  1 g Intravenous Q24H  . chlorhexidine  15 mL Mouth Rinse BID  . dextrose      . docusate  50 mg Oral Daily  . doxycycline  100 mg Oral Q12H  . folic acid  1 mg Oral Daily  . heparin  5,000 Units Subcutaneous Q8H  . hydrALAZINE  10 mg Oral TID  . ipratropium-albuterol  3 mL Nebulization TID  . lisinopril  10 mg Oral Daily  . methadone  50 mg Oral Daily  . multivitamin with minerals  1 tablet Oral Daily  . potassium chloride  40 mEq Oral Once  . sodium chloride  3 mL Intravenous Q12H  . thiamine  100 mg Oral Daily   Or  . thiamine  100 mg Intravenous Daily   Continuous Infusions:  PRN Meds:.LORazepam, LORazepam, polyethylene glycol powder Assessment/Plan: Principal Problem:   CAP (community acquired pneumonia) Active Problems:   Anemia   Acute encephalopathy   Generalized weakness   Adenocarcinoma, lung s/p partial  lobectomy   Dysphagia, unspecified(787.20)   Chronic combined systolic and diastolic congestive heart failure   Substance abuse   Essential hypertension, benign   Unspecified constipation Mr. Swatzell is a 63 year old African American male with combined CHF, lung adenocarcinoma s/p partial lobectomy, recurrent pneumonias, and substance abuse admitted for CAP with no improvement on outpatient therapy.   ?CAP with concern of possible aspiration given dysphagia and limited swallowing ability.  Per review of scans and xray's appears to be recurrent infections and chronic effusions in setting of hx of lung adenocarcinoma lost to follow up.  treated with IV levaquin for 2 days as an outpatient, transitioned to ceftriaxone and doxycycline on admission (for qt prolongation CAP coverage).  He remains afebrile, no leukocytosis, and improved cough.  Unable to provide sputum sample thus far.  Given his history, there is concern for recurrent malignancy.  We discussed case with pulmonary who also recommends thoracentesis for further evaluation. CT chest yesterday with hazy b/l peribronchial opacities with associated bronchiectasis, small-moderate right effusion, left small pleural effusion with mild loculation; ?multifocal PNA.  Scarring at b/l lung bases with new 28mm nodule at LLL lobe.  -d/c Rocephin and doxycyline for CAP, start zosyn (coverage for ?aspiration) -oxygen prn, keep o2 sats >92%  -tylenol prn fever  -blood cultures x2 pending -sputum culture to be collected -pending thoracentesis, discussed with IR, to be done tomorrow and will order corresponding labs -Respiratory virus panel pending -HIV neg -legionella antigen pending and strep pneumoniae urinary antigen negative  -CE negative  -based on CT findings and patient complicated respiratory history, will consult pulmonary for possible bronchoscopy and further evaluation of effusions -duoneb q8h  -check pulse ox with ambulation   Acute  encephalopathy--appears back to baseline.  Likely multifactorial given history of dementia, recent PNA, hx of prior CVA's, and on medications including recent increased dose of Methadone and levaquin.  -neuro checks  -continue methadone 50mg  -fall and seizure precautions  -consider EEG if reported "shaking episodes" -NPO for now, pending SLP eval  -PT/OT   Iron deficiency anemia--Hb 8.5 with MCV 67 on admission, appears stable from 04/2013. Denies any hematuria or bloody BM. Not on iron supplementation at home.  -trend cbc  -recommend starting iron supplementation when evaluated by speech   Adenocarcinoma of lung s/p left partial lobectomy in [place] (Dr. Cydney Ok once by Dr. Julien Nordmann at [place] [facility] in May 2014 for ?recurrency and did not follow up for recurrent imaging. CT chest on admission shows  b/l effusions, mild loculation on left ?multifocal PNA, 10mm nodule at LLL lobe that will need to be follow up in 1 year, mildly enlarged mediastinal nodes.  -pulmonary consultation today, may need bronch -will need outpatient oncology follow up   Dysphagia--ongoing for several weeks, occasional trouble swallowing and pain and regurgitates undigested food at times. Aspiration risk. Hx of CVA's and malignancy lost to follow up. Per CT chest on admission: esophagus partially filled with fluid raising concern for esophageal dysmotility.  Evaluated by SLP who recommended GI evaluation.  Discussed with GI who recommended barium swallow.  -NPO for now -barium swallow -appreciate GI following--discussed with Dr. Henrene Pastor today who will see tomorrow  Chronic combined systolic and diastolic heart failure--per echo 03/2013: EF 35-40%, diffuse hypokinesis, moderately reduced systolic function, grade 2 diastolic dysfunction, diffuse hypokinesis. Home medications include ASA 81mg  qd, hydralazine 10mg  tid, and lisinopril 10mg  qd. Weight today 154lb's. Weight in August noted to be 147lb's. Does not appear volume  overloaded on exam today, no lower extremity edema. CXR and CT with b/l small effusions. CE negative.  -continue home medications for now   HTN--BP on admission 153/97. Home medications include lisinopril 10mg  qd and hydralazine 10mg  TID.  BP elevated this morning. Will need to remain NPO given SLP eval. -start clonidine patch -prn hydralazine 5mg  iv q6h  Hx of substance abuse--hx of heroin and alcohol use, now on methadone. -HIV neg -UDS neg -continue methadone, need to verify dose, but will continue 50mg  for now -CIWA protocol, has not required ativan yet  Constipation--reports lack of BM for several days -miralax -may need suppository  Diet: NPO for now pending SLP eval  DVT Ppx: Heparin Dispo: Disposition is deferred at this time, awaiting improvement of current medical problems.  Anticipated discharge in approximately 2-3 day(s).   The patient does have a current PCP (No Pcp Per Patient) and does not need an [facility] hospital follow-up appointment after discharge. Will follow [facility] with [facility].    The patient does not have transportation limitations that hinder transportation to clinic appointments.  Services Needed at time of discharge: Y = Yes, Blank = No PT:   OT:   RN:   Equipment:   Other:     LOS: 1 day   Jerene Pitch, MD 11/22/2013, 11:41 AM

## 2013-11-22 NOTE — Progress Notes (Signed)
Medical Student Daily Progress Note  This is a 63 year old man with a PMH of L lung adenocarcinoma s/p partial lobectomy without chemo/radiation in 2009 and history of TB, as well as multiple lacunar strokes with mild vascular dementia, CAD, HTN, previous GI bleed, depression and anxiety, and polysubstance abuse now on methadone, admitted for acute encephalopathy and multilobar CAP.  Subjective: Patient reports feeling better today than yesterday, except for increased pain in his L hip at his prior surgical site. Denies chest pain, shortness of breath, headache, nausea or vomiting, and abdominal pain. Still has not had a BM (6 days now).  Objective: Vital signs in last 24 hours: Filed Vitals:   11/22/13 0422 11/22/13 0431 11/22/13 0850 11/22/13 0857  BP:  174/111 165/115 165/110  Pulse:  86 90   Temp:  98.2 F (36.8 C)  96.6 F (35.9 C)  TempSrc:  Oral  Axillary  Resp:  18 18   Height:      Weight: 70.1 kg (154 lb 8.7 oz)     SpO2:  100%  100%   Filed Weights   11/21/13 1637 11/22/13 0422  Weight: 69.99 kg (154 lb 4.8 oz) 70.1 kg (154 lb 8.7 oz)     Intake/Output Summary (Last 24 hours) at 11/22/13 1003 Last data filed at 11/22/13 0914  Gross per 24 hour  Intake    950 ml  Output    425 ml  Net    525 ml    Physical Exam: GENERAL: lying in bed, in NAD  NEURO: Alert, oriented, and cooperative with exam.  HEENT: Lips dry and cracked. HEART: RRR, crisp S1, normal S2, no murmurs, rubs, or gallops.  LUNGS: No breath sounds in LLL. Good air movement in LUL and RUL. RLL with very diminished breath sounds. RML with decreased breath sounds. No wheezes, rales, or rhonchi. Normal work of breathing on room air. ABDOMEN: NABS. Soft, nontender, and nondistended.  EXTREMITIES: No pretibial edema. L lateral thigh with ~8cmx2cm scar over greater trochanter, with ~2-3 cm area of palpable air crepitation. Hip pain not reproducible with palpation.  Lab Results: Results for orders placed  during the hospital encounter of 11/21/13 (from the past 24 hour(s))  TROPONIN I     Status: None   Collection Time    11/21/13 11:45 AM      Result Value Range   Troponin I <0.30  <0.30 ng/mL  POCT I-STAT TROPONIN I     Status: None   Collection Time    11/21/13 12:00 PM      Result Value Range   Troponin i, poc 0.03  0.00 - 0.08 ng/mL   Comment 3           CG4 I-STAT (LACTIC ACID)     Status: None   Collection Time    11/21/13 12:03 PM      Result Value Range   Lactic Acid, Venous 1.56  0.5 - 2.2 mmol/L  URINALYSIS, ROUTINE W REFLEX MICROSCOPIC     Status: Abnormal   Collection Time    11/21/13 12:36 PM      Result Value Range   Color, Urine AMBER (*) YELLOW   APPearance CLEAR  CLEAR   Specific Gravity, Urine 1.029  1.005 - 1.030   pH 5.5  5.0 - 8.0   Glucose, UA NEGATIVE  NEGATIVE mg/dL   Hgb urine dipstick NEGATIVE  NEGATIVE   Bilirubin Urine SMALL (*) NEGATIVE   Ketones, ur NEGATIVE  NEGATIVE mg/dL  Protein, ur NEGATIVE  NEGATIVE mg/dL   Urobilinogen, UA 2.0 (*) 0.0 - 1.0 mg/dL   Nitrite NEGATIVE  NEGATIVE   Leukocytes, UA NEGATIVE  NEGATIVE  HIV ANTIBODY (ROUTINE TESTING)     Status: None   Collection Time    11/21/13  4:20 PM      Result Value Range   HIV NON REACTIVE  NON REACTIVE  GLUCOSE, CAPILLARY     Status: Abnormal   Collection Time    11/21/13  9:41 PM      Result Value Range   Glucose-Capillary 104 (*) 70 - 99 mg/dL  TROPONIN I     Status: None   Collection Time    11/22/13  1:12 AM      Result Value Range   Troponin I <0.30  <0.30 ng/mL  COMPREHENSIVE METABOLIC PANEL     Status: Abnormal   Collection Time    11/22/13  1:12 AM      Result Value Range   Sodium 141  137 - 147 mEq/L   Potassium 3.6 (*) 3.7 - 5.3 mEq/L   Chloride 104  96 - 112 mEq/L   CO2 24  19 - 32 mEq/L   Glucose, Bld 85  70 - 99 mg/dL   BUN 15  6 - 23 mg/dL   Creatinine, Ser 0.89  0.50 - 1.35 mg/dL   Calcium 8.2 (*) 8.4 - 10.5 mg/dL   Total Protein 7.7  6.0 - 8.3 g/dL    Albumin 2.5 (*) 3.5 - 5.2 g/dL   AST 211 (*) 0 - 37 U/L   ALT 112 (*) 0 - 53 U/L   Alkaline Phosphatase 146 (*) 39 - 117 U/L   Total Bilirubin 0.7  0.3 - 1.2 mg/dL   GFR calc non Af Amer 89 (*) >90 mL/min   GFR calc Af Amer >90  >90 mL/min  CBC     Status: Abnormal   Collection Time    11/22/13  1:12 AM      Result Value Range   WBC 7.7  4.0 - 10.5 K/uL   RBC 4.14 (*) 4.22 - 5.81 MIL/uL   Hemoglobin 8.2 (*) 13.0 - 17.0 g/dL   HCT 27.5 (*) 39.0 - 52.0 %   MCV 66.4 (*) 78.0 - 100.0 fL   MCH 19.8 (*) 26.0 - 34.0 pg   MCHC 29.8 (*) 30.0 - 36.0 g/dL   RDW 17.9 (*) 11.5 - 15.5 %   Platelets 336  150 - 400 K/uL  URINE RAPID DRUG SCREEN (HOSP PERFORMED)     Status: None   Collection Time    11/22/13  7:02 AM      Result Value Range   Opiates NONE DETECTED  NONE DETECTED   Cocaine NONE DETECTED  NONE DETECTED   Benzodiazepines NONE DETECTED  NONE DETECTED   Amphetamines NONE DETECTED  NONE DETECTED   Tetrahydrocannabinol NONE DETECTED  NONE DETECTED   Barbiturates NONE DETECTED  NONE DETECTED  GLUCOSE, CAPILLARY     Status: Abnormal   Collection Time    11/22/13  7:45 AM      Result Value Range   Glucose-Capillary 65 (*) 70 - 99 mg/dL  GLUCOSE, CAPILLARY     Status: Abnormal   Collection Time    11/22/13  8:28 AM      Result Value Range   Glucose-Capillary 123 (*) 70 - 99 mg/dL    Micro Results: No results found for this or any previous visit (from the  past 240 hour(s)).  Studies/Results: Dg Chest 2 View  11/21/2013   CLINICAL DATA:  One week history of shortness of breath  EXAM: CHEST  2 VIEW  COMPARISON:  Prior chest x-ray 11/18/2013 ; prior CT chest/abdomen/ pelvis 02/26/2013  FINDINGS: Slightly increased ill-defined patchy airspace opacities predominantly in the right lung base but also in the bilateral mid lungs. A persistent moderate right layering pleural effusion. Also likely a small left pleural effusion versus pleural thickening. Unchanged cardiac and mediastinal  contours. Atherosclerotic calcification noted in the transverse aorta. No acute osseous abnormality.  IMPRESSION: 1. Similar to slight interval progression of bilateral patchy airspace disease worst in the right lower lobe. Findings remain concerning for a multifocal infectious/inflammatory process. 2. Small bilateral pleural effusions.   Electronically Signed   By: Jacqulynn Cadet M.D.   On: 11/21/2013 13:07   Ct Chest W Contrast  11/21/2013   CLINICAL DATA:  Known pneumonia, with productive cough and generalized weakness.  EXAM: CT CHEST WITH CONTRAST  TECHNIQUE: Multidetector CT imaging of the chest was performed during intravenous contrast administration.  CONTRAST:  89mL OMNIPAQUE IOHEXOL 300 MG/ML  SOLN  COMPARISON:  Chest radiograph performed earlier today at 12:07 p.m., and CT of the chest performed 02/26/2013  FINDINGS: Hazy peribronchial opacities are seen bilaterally, more prominent at the right lung base, with associated bronchiectasis. More peripheral opacities are seen within the left lung. Small to moderate right and small left pleural effusions are seen, mildly loculated on the left. There is associated scarring at the lung bases, with scattered blebs seen at the lung apices. Some of the opacities are relatively nodular in appearance. A 4 mm nodule is seen in the left lower lobe (image 46 of 69). There is no evidence of pneumothorax.  Scattered coronary artery calcifications are seen. The esophagus is partially filled with fluid, raising concern for esophageal dysmotility. Mildly enlarged right paratracheal, subcarinal and azygoesophageal recess nodes are seen, measuring up to 1.3 cm in short axis. Trace pericardial fluid remains within normal limits. The great vessels are grossly unremarkable in appearance. The thyroid gland is unremarkable in appearance. No axillary lymphadenopathy is seen.  A 2.9 x 1.2 cm collection of fluid attenuation along the anterior aspect of the liver is only minimally  changed from prior study, and is likely benign. Trace fluid is seen tracking about the liver. The visualized portions of the liver and spleen are otherwise unremarkable.  IMPRESSION: 1. Hazy bilateral peribronchial opacities, more prominent at the right lung base, with associated bronchiectasis. More peripheral opacities seen within the left lung. Small to moderate right and small left pleural effusions seen, mildly loculated on left. This raises concern for multifocal pneumonia, possibly atypical in nature. 2. Scarring at the lung bases, with scattered blebs at the lung apices. 4 mm nodule at the left lower lung lobe; this region was not well assessed on prior studies due to airspace opacification. If the patient is at high risk for bronchogenic carcinoma, follow-up chest CT at 1 year is recommended. If the patient is at low risk, no follow-up is needed. This recommendation follows the consensus statement: Guidelines for Management of Small Pulmonary Nodules Detected on CT Scans: A Statement from the [employer] as published in Radiology 2005; 237:395-400. 3. Scattered coronary artery calcifications seen. 4. Esophagus partially filled with fluid, raising concern for esophageal dysmotility. 5. Mildly enlarged mediastinal nodes seen, measuring up to 1.3 cm in short axis. Several of these are similar in size on the prior study,  and are nonspecific in appearance. 6. 2.9 x 1.2 cm collection of fluid attenuation along the anterior aspect of the liver is only minimally changed from the prior study, and likely reflects a cyst. 7. Trace ascites noted about the liver.   Electronically Signed   By: Garald Balding M.D.   On: 11/21/2013 21:43    Medications:  Scheduled Meds: . antiseptic oral rinse  15 mL Mouth Rinse q12n4p  . aspirin EC  81 mg Oral Daily  . cefTRIAXone (ROCEPHIN)  IV  1 g Intravenous Q24H  . chlorhexidine  15 mL Mouth Rinse BID  . dextrose      . docusate  50 mg Oral Daily  . doxycycline  100  mg Oral Q12H  . folic acid  1 mg Oral Daily  . heparin  5,000 Units Subcutaneous Q8H  . hydrALAZINE  10 mg Oral TID  . ipratropium-albuterol  3 mL Nebulization TID  . lisinopril  10 mg Oral Daily  . methadone  50 mg Oral Daily  . multivitamin with minerals  1 tablet Oral Daily  . potassium chloride  40 mEq Oral Once  . sodium chloride  3 mL Intravenous Q12H  . thiamine  100 mg Oral Daily   Or  . thiamine  100 mg Intravenous Daily   Continuous Infusions:  PRN Meds:.LORazepam, LORazepam, polyethylene glycol powder  Assessment/Plan:  Acute Encephalopathy. Pt presented with weakness, confusion, gait instability; neuro exam seems to be at baseline today. Most likely mild delirium 2/2 recent increase of methadone dose, now superimposed on concomitant illness and with levofloxacin which can be psychoactive in older or medically frail individuals. Ammonia normal. CVA, subdural bleed, NPH considered but unlikely. - reduced methadone dose to 50 mg daily  - re-orientation as needed  - minimize psychoactive meds  - PT/OT to eval  - fall precautions   Cough/SOB: continuing treatment for CAP with ceftriaxone + doxycycline given prolonged QT. SOB has resolved and patient is not requiring O2; WBC remains normal and patient is afebrile. CT chest shows hazy bilateral peribronchial opacities, R>L, with bilateral effusions and some loculation on the L suggestive of a multifocal, atypical pneumonia. He also has some scarring and nodularity including a 21mm nodule in the LLL. Given imaging, CHF, cancer hx, not clear that this is straightforward CAP; must consider atypical organisms or non-infectious pneumonia as well. - HIV ELISA nonreactive - urine S. pneumo antigen neg. - ceftriaxone 1g IV q24  - doxycycline 100 mg po q12  - consulting pulmonology to consider diagnostic thoracentesis, bronchoscopy - respiratory virus panel pending - urine legionella antigen pending - Duonebs  Q6h  Dysphagia/Odynophagia: CT shows residual fluid in esophagus suggestive of dysmotility or partial esophageal obstruction. Differential includes malignant processes vs esophagitis vs esophageal dysmotility or stricture.  - SLP speech and swallow study  - NPO pending swallow study results - likely needs GI follow up for consideration of barium swallow or endoscopy, perhaps as inpatient depending on SLP findings   Shaking episodes: Per family's description, somewhat concerned that the patient is having complex partial vs simple partial seizures; however, these episodes have been much less frequent lately thus are not likely related to his functional decline. No episodes reported during current hospitalization; EEG likely to be low yield given infrequent nature of these episodes. - outpatient neurology follow up  - would consider EEG if patient noted to have staring or shaking episodes while in hospital  CHF/CAD/HTN: currently stable. Patient does not appear volume overloaded. Troponins  negative x3 - aspirin 81 mg daily  - hydralazine 10 mg TID  - lisinopril 10 mg daily  - telemetry   Urinary hesitancy: patient urinating infrequently due to urinary hesitancy, with 300 cc on bladder scan. Had some pelvic adenopathy on his prior PET that was thought to be secondary to his preceding hip surgery. No prostatic enlargement or nodularity appreciated on rectal exam, which was notably limited by patient positioning and discomfort. - encouraged patient to try to urinate; if residual >400, will place foley to avoid repetitive I&O caths - will consider adding tamsulosin 0.4 mg daily pending swallow eval - likely needs outpatient urology follow up for evaluation of his urinary hesitancy.   Polysubstance use including alcohol, methadone: patient with elevated BP this a.m. But no anxiety, tremor, diaphoresis, agitation this morning. - continue methadone at reduced dose, 50mg  po daily  - will need to touch  base with methadone clinic re: dosing on Monday  - thiamine 100 mg daily  - lorazepam 1mg  po q6 PRN severe anxiety -- has not required - CIWA protocol   Anemia: Hgb stable at 8.2 today. MCV 67 with polychromasia on smear, strongly suggestive of iron deficiency anemia.  - will supplement with oral iron, but holding off for now pending speech evaluation .  Transaminitis with small volume ascites on chest CT: patient reports a history of hepatitis C.Transaminases previously elevated in May 2014 (ALT 457 -> 252, AST 1855->340) but normalized in June 2014 to ALT 10, AST 23.  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 - daily CMET  L Hip pain: patient c/o moderate pain at site of his L hip internal fixation. Some air crepitance on exam but patient with no localized tenderness. Hip X ray 1/9 was unremarkable but no note of subcutaneous free air at that time. - will review hip X ray with radiology today; holding off on new XR for now. - avoiding narcotics 2/2 hx abuse, on methadone, and concern for AMS - avoiding ibuprofen 2/2 HTN and tylenol 2/2 transaminitis.  FEN/GI:  - Speech/language pathology formal swallow eval  - NPO pending SLP eval, with ice chips - IV saline locked - Miralax and docusate for constipation  - MVI   PPx:  - SQH  - ambulation with assistance  - aspiration precautions   LOS: 1 day   This is a Careers information officer Note.  The care of the patient was discussed with Dr. Eula Fried and the assessment and plan formulated with their assistance.  Please see their attached note for official documentation of the daily encounter.  Wenona [place] 11/22/2013, 10:03 AM

## 2013-11-23 ENCOUNTER — Inpatient Hospital Stay (HOSPITAL_COMMUNITY): Payer: Medicaid Other

## 2013-11-23 DIAGNOSIS — R1310 Dysphagia, unspecified: Secondary | ICD-10-CM

## 2013-11-23 DIAGNOSIS — R7401 Elevation of levels of liver transaminase levels: Secondary | ICD-10-CM

## 2013-11-23 DIAGNOSIS — R3916 Straining to void: Secondary | ICD-10-CM

## 2013-11-23 DIAGNOSIS — R740 Nonspecific elevation of levels of transaminase and lactic acid dehydrogenase [LDH]: Secondary | ICD-10-CM

## 2013-11-23 DIAGNOSIS — R933 Abnormal findings on diagnostic imaging of other parts of digestive tract: Secondary | ICD-10-CM

## 2013-11-23 DIAGNOSIS — D638 Anemia in other chronic diseases classified elsewhere: Secondary | ICD-10-CM

## 2013-11-23 LAB — PH, BODY FLUID: pH, Fluid: 8

## 2013-11-23 LAB — RESPIRATORY VIRUS PANEL
Adenovirus: NOT DETECTED
INFLUENZA A H3: NOT DETECTED
INFLUENZA A: NOT DETECTED
INFLUENZA B 1: NOT DETECTED
Influenza A H1: NOT DETECTED
METAPNEUMOVIRUS: NOT DETECTED
PARAINFLUENZA 1 A: NOT DETECTED
PARAINFLUENZA 2 A: NOT DETECTED
Parainfluenza 3: NOT DETECTED
RESPIRATORY SYNCYTIAL VIRUS A: NOT DETECTED
RHINOVIRUS: NOT DETECTED
Respiratory Syncytial Virus B: NOT DETECTED

## 2013-11-23 LAB — LEGIONELLA ANTIGEN, URINE: LEGIONELLA ANTIGEN, URINE: NEGATIVE

## 2013-11-23 LAB — BODY FLUID CELL COUNT WITH DIFFERENTIAL
Eos, Fluid: 0 %
LYMPHS FL: 59 %
MONOCYTE-MACROPHAGE-SEROUS FLUID: 7 % — AB (ref 50–90)
Neutrophil Count, Fluid: 34 % — ABNORMAL HIGH (ref 0–25)
Total Nucleated Cell Count, Fluid: 357 cu mm (ref 0–1000)

## 2013-11-23 LAB — GLUCOSE, CAPILLARY
GLUCOSE-CAPILLARY: 84 mg/dL (ref 70–99)
GLUCOSE-CAPILLARY: 90 mg/dL (ref 70–99)
Glucose-Capillary: 92 mg/dL (ref 70–99)
Glucose-Capillary: 81 mg/dL (ref 70–99)

## 2013-11-23 LAB — COMPREHENSIVE METABOLIC PANEL
ALBUMIN: 2.4 g/dL — AB (ref 3.5–5.2)
ALT: 94 U/L — ABNORMAL HIGH (ref 0–53)
AST: 171 U/L — AB (ref 0–37)
Alkaline Phosphatase: 139 U/L — ABNORMAL HIGH (ref 39–117)
BUN: 14 mg/dL (ref 6–23)
CALCIUM: 8.1 mg/dL — AB (ref 8.4–10.5)
CO2: 21 mEq/L (ref 19–32)
CREATININE: 0.88 mg/dL (ref 0.50–1.35)
Chloride: 104 mEq/L (ref 96–112)
GFR calc Af Amer: >90 mL/min (ref >90)
GFR calc non Af Amer: 90 mL/min — ABNORMAL LOW (ref >90)
Glucose, Bld: 79 mg/dL (ref 70–99)
Potassium: 4.5 mEq/L (ref 3.7–5.3)
Sodium: 141 mEq/L (ref 137–147)
TOTAL PROTEIN: 7.5 g/dL (ref 6.0–8.3)
Total Bilirubin: 1.2 mg/dL (ref 0.3–1.2)

## 2013-11-23 LAB — LACTATE DEHYDROGENASE, PLEURAL OR PERITONEAL FLUID: LD FL: 81 U/L — AB (ref 3–23)

## 2013-11-23 LAB — CHOLESTEROL, BODY FLUID: CHOL FL: 14 mg/dL

## 2013-11-23 LAB — PROTEIN, TOTAL: Total Protein: 7.5 g/dL (ref 6.0–8.3)

## 2013-11-23 LAB — PROTEIN, BODY FLUID: Total protein, fluid: 2.2 g/dL

## 2013-11-23 LAB — GLUCOSE, SEROUS FLUID: Glucose, Fluid: 82 mg/dL

## 2013-11-23 LAB — LACTATE DEHYDROGENASE: LDH: 400 U/L — ABNORMAL HIGH (ref 94–250)

## 2013-11-23 NOTE — Progress Notes (Signed)
SLP Cancellation Note  Patient Details Name: Mark [name] MRN: [id number] DOB: 04/19/1951   Cancelled treatment:       Reason Eval/Treat Not Completed: Patient's level of consciousness. Could not arouse pt to participate in swallow therapy. There is a meal tray in room despite NPO order. RN made aware. Will f/u tomorrow.    Wen Munford, Katherene Ponto 11/23/2013, 2:24 PM

## 2013-11-23 NOTE — Progress Notes (Signed)
Spoke with [facility] Student regarding patient receiving PO pills today. She said the patient needed to be on strict NPO with no pills at this time. Held aspirin, folic acid, methadone, and multivitamin. Will continue to monitor. [place], [employer]

## 2013-11-23 NOTE — Progress Notes (Signed)
Internal Medicine Attending  Date: 11/23/2013  Patient name: Mark [name] Medical record number: [id number] Date of birth: Aug 01, 1951 Age: 63 y.o. Gender: male  I saw and evaluated the patient, and discussed his care on A.M rounds with housestaff.  Patient received some Ativan overnight for agitation, and this morning was somnolent and difficult to arouse.  He appeared to be in no distress.  Physical exam was otherwise unchanged. Vital signs, oxygen saturation, and glucose were normal.  Thoracentesis was performed this morning by interventional radiology, and the fluid appears to be transudative.  A barium esophagram this morning was technically suboptimal due to patient debilitation, inability to cooperate with swallowing, and small amount of ingested contrast; no focal strictures or esophageal mass lesions were  Identified; a small epiphrenic diverticulum was noted; there was marked esophageal stasis, and tertiary contractions associated with nonspecific esophageal dysmotility disorder.  The cause of patient's altered mental status is not completely clear; he did receive Ativan overnight, and his somnolence could be the combined effects of Ativan and methadone which he is on for chronic maintenance therapy.  He has apparently had similar episodes of AMS at home prior to this hospitalization.  His clinical picture is not suggestive of meningitis.   Plan is to hold sedating medications; monitor closely; supportive care; if his mental status does not clear, pursue further workup including head CT or MRI, EEG, and possibly neurology consult.  Will continue empiric antibiotic therapy for pneumonia, and await pulmonary consult input.

## 2013-11-23 NOTE — Progress Notes (Signed)
Medical Student Daily Progress Note  This is a 63 year old man with a PMH of L lung adenocarcinoma s/p partial lobectomy without chemo/radiation in 2009 and history of TB, as well as multiple lacunar strokes with mild vascular dementia, CAD, HTN, previous GI bleed, depression and anxiety, and polysubstance abuse now on methadone, admitted for acute encephalopathy and multilobar CAP.  Subjective: Patient agitated overnight, removing monitors/pulse ox/nasal canula, reportedly hypoxic although given the history it seems unlikely that perhaps his pulse oximeter was just briefly dislodged and not measuring appropriately. He was put in soft mittens to limit further removal of monitors. His CIWA score was >9 and he received IV ativan for agitation.   This a.m. He underwent thoracentesis and barium swallow; upon return, he was stuporous and difficult to arouse. He responded to sternal rub and can follow commands and answer questions briefly but then falls asleep again. Mental status seems to have improved moderately through the day today but still very somnolent.  Objective: Vital signs in last 24 hours: Filed Vitals:   11/23/13 0607 11/23/13 0935 11/23/13 0945 11/23/13 0955  BP: 157/104 164/116 176/112 157/112  Pulse: 90     Temp: 98.4 F (36.9 C)     TempSrc: Oral     Resp: 20     Height:      Weight: 69.5 kg (153 lb 3.5 oz)     SpO2: 100%        Weight change: 2.177 kg (4 lb 12.8 oz)   Intake/Output Summary (Last 24 hours) at 11/23/13 1517 Last data filed at 11/23/13 0646  Gross per 24 hour  Intake 905.17 ml  Output    320 ml  Net 585.17 ml    Physical Exam: GENERAL: asleep in bed NEURO: Responsive to loud verbal and painful stimuli. Intermittently oriented to person and place but very somnolent. Moves UE and LE spontaneously.  HEENT: Lips dry and cracked.  HEART: RRR, crisp S1, normal S2, no murmurs, rubs, or gallops.  LUNGS: Decreased breath sounds bilaterally; lung exam limited  by patient's mental status. On O2 per nasal cannula. ABDOMEN: NABS. Soft, nontender, and nondistended.  EXTREMITIES: No pretibial edema. L lateral thigh with ~8cmx2cm scar over greater trochanter, with ~2-3 cm area of palpable air crepitation. Hip pain not reproducible with palpation.   Lab Results: Results for orders placed during the hospital encounter of 11/21/13 (from the past 24 hour(s))  GLUCOSE, CAPILLARY     Status: None   Collection Time    11/22/13  3:32 PM      Result Value Range   Glucose-Capillary 79  70 - 99 mg/dL  GLUCOSE, CAPILLARY     Status: None   Collection Time    11/22/13  8:40 PM      Result Value Range   Glucose-Capillary 91  70 - 99 mg/dL  GLUCOSE, CAPILLARY     Status: None   Collection Time    11/23/13 12:03 AM      Result Value Range   Glucose-Capillary 90  70 - 99 mg/dL   Comment 1 Documented in Chart     Comment 2 Notify RN    GLUCOSE, CAPILLARY     Status: None   Collection Time    11/23/13  4:16 AM      Result Value Range   Glucose-Capillary 84  70 - 99 mg/dL   Comment 1 Documented in Chart     Comment 2 Notify RN    COMPREHENSIVE METABOLIC PANEL  Status: Abnormal   Collection Time    11/23/13  5:05 AM      Result Value Range   Sodium 141  137 - 147 mEq/L   Potassium 4.5  3.7 - 5.3 mEq/L   Chloride 104  96 - 112 mEq/L   CO2 21  19 - 32 mEq/L   Glucose, Bld 79  70 - 99 mg/dL   BUN 14  6 - 23 mg/dL   Creatinine, Ser 0.88  0.50 - 1.35 mg/dL   Calcium 8.1 (*) 8.4 - 10.5 mg/dL   Total Protein 7.5  6.0 - 8.3 g/dL   Albumin 2.4 (*) 3.5 - 5.2 g/dL   AST 171 (*) 0 - 37 U/L   ALT 94 (*) 0 - 53 U/L   Alkaline Phosphatase 139 (*) 39 - 117 U/L   Total Bilirubin 1.2  0.3 - 1.2 mg/dL   GFR calc non Af Amer 90 (*) >90 mL/min   GFR calc Af Amer >90  >90 mL/min  PROTEIN, TOTAL     Status: None   Collection Time    11/23/13  5:05 AM      Result Value Range   Total Protein 7.5  6.0 - 8.3 g/dL  LACTATE DEHYDROGENASE     Status: Abnormal    Collection Time    11/23/13  5:05 AM      Result Value Range   LDH 400 (*) 94 - 250 U/L  BODY FLUID CELL COUNT WITH DIFFERENTIAL     Status: Abnormal   Collection Time    11/23/13  9:48 AM      Result Value Range   Fluid Type-FCT PLEURAL     Color, Fluid YELLOW  YELLOW   Appearance, Fluid HAZY (*) CLEAR   WBC, Fluid 357  0 - 1000 cu mm   Neutrophil Count, Fluid 34 (*) 0 - 25 %   Lymphs, Fluid 59     Monocyte-Macrophage-Serous Fluid 7 (*) 50 - 90 %   Eos, Fluid 0     Other Cells, Fluid Mesothelial cells present    LACTATE DEHYDROGENASE, BODY FLUID     Status: Abnormal   Collection Time    11/23/13  9:48 AM      Result Value Range   LD, Fluid 81 (*) 3 - 23 U/L   Fluid Type-FLDH PLEURAL    PROTEIN, BODY FLUID     Status: None   Collection Time    11/23/13  9:48 AM      Result Value Range   Total protein, fluid 2.2     Fluid Type-FTP PLEURAL    GLUCOSE, SEROUS FLUID     Status: None   Collection Time    11/23/13  9:48 AM      Result Value Range   Glucose, Fluid 82     Fluid Type-FGLU PLEURAL    GLUCOSE, CAPILLARY     Status: None   Collection Time    11/23/13 12:07 PM      Result Value Range   Glucose-Capillary 92  70 - 99 mg/dL    Micro Results: Recent Results (from the past 240 hour(s))  CULTURE, BLOOD (ROUTINE X 2)     Status: None   Collection Time    11/21/13 11:45 AM      Result Value Range Status   Specimen Description BLOOD   Final   Special Requests     Final   Value: BOTTLES DRAWN AEROBIC AND ANAEROBIC 5C FROM RIGHT EJ  Culture  Setup Time     Final   Value: 11/21/2013 18:05     Performed at [facility]   Culture     Final   Value:        BLOOD CULTURE RECEIVED NO GROWTH TO DATE CULTURE WILL BE HELD FOR 5 DAYS BEFORE ISSUING A FINAL NEGATIVE REPORT     Performed at [facility]   Report Status PENDING   Incomplete  CULTURE, BLOOD (ROUTINE X 2)     Status: None   Collection Time    11/21/13 11:45 AM      Result Value Range Status    Specimen Description BLOOD   Final   Special Requests BOTTLES DRAWN AEROBIC ONLY 5CC FROM RIGHT EJ   Final   Culture  Setup Time     Final   Value: 11/21/2013 18:05     Performed at [facility]   Culture     Final   Value:        BLOOD CULTURE RECEIVED NO GROWTH TO DATE CULTURE WILL BE HELD FOR 5 DAYS BEFORE ISSUING A FINAL NEGATIVE REPORT     Performed at [facility]   Report Status PENDING   Incomplete    Studies/Results: Dg Chest 1 View  11/23/2013   CLINICAL DATA:  Status post right thoracentesis  EXAM: CHEST - 1 VIEW  COMPARISON:  CT chest dated 11/21/2013  FINDINGS: No definite pneumothorax is seen status post right thoracentesis.  Mild patchy right lower lobe opacity, suspicious for pneumonia.  Small loculated/layering left pleural effusion.  Cardiomegaly.  Residual contrast in the mid esophagus from recent fluoroscopic study, reflecting esophageal dysmotility and/or reflux.  IMPRESSION: No definite pneumothorax is seen status post right thoracentesis.  Small loculated/layering left pleural effusion.  Mild patchy right lower lobe opacity, suspicious for pneumonia.   Electronically Signed   By: Julian Hy M.D.   On: 11/23/2013 10:46   Ct Chest W Contrast  11/21/2013   CLINICAL DATA:  Known pneumonia, with productive cough and generalized weakness.  EXAM: CT CHEST WITH CONTRAST  TECHNIQUE: Multidetector CT imaging of the chest was performed during intravenous contrast administration.  CONTRAST:  74mL OMNIPAQUE IOHEXOL 300 MG/ML  SOLN  COMPARISON:  Chest radiograph performed earlier today at 12:07 p.m., and CT of the chest performed 02/26/2013  FINDINGS: Hazy peribronchial opacities are seen bilaterally, more prominent at the right lung base, with associated bronchiectasis. More peripheral opacities are seen within the left lung. Small to moderate right and small left pleural effusions are seen, mildly loculated on the left. There is associated scarring at the lung bases,  with scattered blebs seen at the lung apices. Some of the opacities are relatively nodular in appearance. A 4 mm nodule is seen in the left lower lobe (image 46 of 69). There is no evidence of pneumothorax.  Scattered coronary artery calcifications are seen. The esophagus is partially filled with fluid, raising concern for esophageal dysmotility. Mildly enlarged right paratracheal, subcarinal and azygoesophageal recess nodes are seen, measuring up to 1.3 cm in short axis. Trace pericardial fluid remains within normal limits. The great vessels are grossly unremarkable in appearance. The thyroid gland is unremarkable in appearance. No axillary lymphadenopathy is seen.  A 2.9 x 1.2 cm collection of fluid attenuation along the anterior aspect of the liver is only minimally changed from prior study, and is likely benign. Trace fluid is seen tracking about the liver. The visualized portions of the liver and  spleen are otherwise unremarkable.  IMPRESSION: 1. Hazy bilateral peribronchial opacities, more prominent at the right lung base, with associated bronchiectasis. More peripheral opacities seen within the left lung. Small to moderate right and small left pleural effusions seen, mildly loculated on left. This raises concern for multifocal pneumonia, possibly atypical in nature. 2. Scarring at the lung bases, with scattered blebs at the lung apices. 4 mm nodule at the left lower lung lobe; this region was not well assessed on prior studies due to airspace opacification. If the patient is at high risk for bronchogenic carcinoma, follow-up chest CT at 1 year is recommended. If the patient is at low risk, no follow-up is needed. This recommendation follows the consensus statement: Guidelines for Management of Small Pulmonary Nodules Detected on CT Scans: A Statement from the [employer] as published in Radiology 2005; 237:395-400. 3. Scattered coronary artery calcifications seen. 4. Esophagus partially filled with  fluid, raising concern for esophageal dysmotility. 5. Mildly enlarged mediastinal nodes seen, measuring up to 1.3 cm in short axis. Several of these are similar in size on the prior study, and are nonspecific in appearance. 6. 2.9 x 1.2 cm collection of fluid attenuation along the anterior aspect of the liver is only minimally changed from the prior study, and likely reflects a cyst. 7. Trace ascites noted about the liver.   Electronically Signed   By: Garald Balding M.D.   On: 11/21/2013 21:43   Dg Esophagus  11/23/2013   CLINICAL DATA:  Difficulty swallowing. Personal history of lung cancer. Generalized weakness. Dysphagia.  EXAM: ESOPHOGRAM/BARIUM SWALLOW  TECHNIQUE: Single contrast examination was performed using  thin barium.  COMPARISON:  Chest CT 11/21/2013.  FLUOROSCOPY TIME:  3 min 15 seconds  FINDINGS: Study is technically suboptimal. The patient was unable to cooperate with swallowing to adequately distend the esophagus. Only about 4 oz of contrast was ingested throughout the study despite instructions. There was profound esophageal stasis. No gross fixed strictures were identified. Tertiary contractions were observed. Esophageal diverticulum was present in the distal esophagus, just proximal to the gastroesophageal junction. 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 Because of patient debilitation, we could not repositioned patient to optimally visualize this distal esophageal diverticulum. No hiatal hernia was identified. There was not adequate distension to assess for reflux. The epiphrenic diverticulum is best visualized on series 20.  IMPRESSION: Technically suboptimal study due to patient debilitation, inability to cooperate with swallowing and small amount of ingested contrast. No focal strictures or esophageal mass lesions identified. Small epiphrenic diverticulum. Marked esophageal stasis. Tertiary contractions associated with nonspecific esophageal dysmotility disorder.    Electronically Signed   By: Dereck Ligas M.D.   On: 11/23/2013 09:43   US Thoracentesis Asp Pleural Space W/img Guide  11/23/2013   CLINICAL DATA:  Pleural effusion, shortness of breath, request for diagnostic and therapeutic thoracentesis  EXAM: ULTRASOUND GUIDED Right THORACENTESIS  COMPARISON:  None.  FINDINGS: A total of approximately 1.2 liters of serous fluid was removed. A fluid sample was sent for laboratory analysis.  IMPRESSION: Successful ultrasound guided right thoracentesis yielding 1.2 liters of pleural fluid.  Read By:  Tsosie Billing PA-C  PROCEDURE: An ultrasound guided thoracentesis was thoroughly discussed with the patient and questions answered. The benefits, risks, alternatives and complications were also discussed. The patient understands and wishes to proceed with the procedure. Written consent was obtained.  Ultrasound was performed to localize and mark an adequate pocket of fluid in the right chest. The area was then  prepped and draped in the normal sterile fashion. 1% Lidocaine was used for local anesthesia. Under ultrasound guidance a 19 gauge Yueh catheter was introduced. Thoracentesis was performed. The catheter was removed and a dressing applied.  Complications:  none.   Electronically Signed   By: Arne Cleveland M.D.   On: 11/23/2013 10:05    Medications:  Scheduled Meds: . antiseptic oral rinse  15 mL Mouth Rinse q12n4p  . aspirin EC  81 mg Oral Daily  . chlorhexidine  15 mL Mouth Rinse BID  . cloNIDine  0.2 mg Transdermal Weekly  . folic acid  1 mg Oral Daily  . heparin  5,000 Units Subcutaneous Q8H  . ipratropium-albuterol  3 mL Nebulization TID  . methadone  50 mg Oral Daily  . multivitamin with minerals  1 tablet Oral Daily  . piperacillin-tazobactam (ZOSYN)  IV  3.375 g Intravenous Q8H  . sodium chloride  3 mL Intravenous Q12H   Continuous Infusions: . dextrose 5 % and 0.45% NaCl 20 mL/hr at 11/23/13 0038   PRN Meds:.  Assessment/Plan:  Acute  Encephalopathy. Pt presented with weakness, confusion, gait instability; somnolent to stuporous today. Most likely delirium 2/2 recent increase of methadone dose, now superimposed on concomitant illness and with levofloxacin which can be psychoactive in older or medically frail individuals. Patient also got ativan last night for CIWA >9 which is likely contributing. Ammonia normal. CVA, subdural bleed, NPH considered but unlikely.  - reduced methadone dose to 50 mg daily  - re-orientation as needed  - minimize psychoactive meds  - PT/OT to eval  - fall precautions  - DC'ed Ativan. Will have MD evaluate if patient becomes agitated; would choose haloperidol before ativan if patient truly needs sedation.  Cough/SOB: Changed antibiotic therapy to Zosyn to cover for anaerobes given dysphagia, aspiration risk. Patient on 2L O2 per nasal cannula, waiting to wean after more alert. WBC remains normal and patient is afebrile. Status post thoracentesis today; pleural fluid chemistry suggests transudative, but waiting on a few more labs and cultures. Given imaging, CHF, cancer hx, not clear that this is straightforward CAP; must consider atypical organisms or non-infectious pneumonia as well.  - HIV ELISA nonreactive  - urine S. pneumo and legionella antigens neg.  - Zosyn 3.35g IV q8 - pulmonology consulted - respiratory virus panel pending  - Duonebs Q6h   Dysphagia/Odynophagia: CT showed residual fluid in esophagus; barium swallow suggestive of dysmotility or with epiphrenic diverticulum. GI consulted, recommend against endoscopy.  - SLP following - NPO  Shaking episodes: Per family's description, somewhat concerned that the patient is having complex partial vs simple partial seizures; however, these episodes have been much less frequent lately thus are not likely related to his functional decline. No episodes reported during current hospitalization; EEG likely to be low yield given infrequent nature of  these episodes.  - outpatient neurology follow up  - would consider EEG if patient noted to have staring or shaking episodes while in hospital   CHF/CAD/HTN: currently stable. Patient does not appear volume overloaded. Troponins negative x3  - aspirin 81 mg daily  - hydralazine 10 mg TID  - lisinopril 10 mg daily  - telemetry   Urinary hesitancy: patient urinating fine here, with low postvoid residuals on bladder scan. - would consider adding tamsulosin 0.4 mg daily if patient returns to p.o. intake - likely needs outpatient urology follow up for evaluation of his urinary hesitancy.   Polysubstance use including alcohol, methadone: patient with elevated BP  this a.m. But no anxiety, tremor, diaphoresis, agitation this morning.  - holding oral meds including methadone 2/2 dysphagia, aspiration risk - lorazepam discontinued given concern for oversedation - CIWA protocol   Anemia: Hgb stable at 8.2 today. MCV 67 with polychromasia on smear, strongly suggestive of iron deficiency anemia.  - consider oral iron supplementation if patient is able to return to eating   Transaminitis with small volume ascites on chest CT in setting of HCV+ patient. ALP, AST, ALT improved today but not yet wnl. 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  - daily CMET  - GI recommended abd CT vs liver U/s to rule out liver metastases; will discuss with team.  L Hip pain: patient c/o moderate pain at site of his L hip internal fixation. Some air crepitance on exam but patient with no localized tenderness. Hip X ray 1/9 was unremarkable but no note of subcutaneous free air at that time. Patient not c/o pain today but somnolent; will re-evaluate when mental status improves. - avoiding narcotics 2/2 hx abuse, on methadone, and concern for AMS  - avoiding  ibuprofen 2/2 HTN and tylenol 2/2 transaminitis.   FEN/GI:  - SLP following - NPO except ice chips  - IV D5 1/2 normal saline at 20 cc/hr, with q4h CBGs - soap suds enemas PRN constipation   PPx:  - SQH  - ambulation with assistance when mental status improves - aspiration precautions     LOS: 2 days   This is a Careers information officer Note.  The care of the patient was discussed with Dr. Eula Fried and the assessment and plan formulated with their assistance.  Please see their attached note for official documentation of the daily encounter.  Edana Aguado 11/23/2013, 3:17 PM

## 2013-11-23 NOTE — Evaluation (Addendum)
Occupational Therapy Evaluation Patient Details Name: Mark [name] MRN: [id number] DOB: 1951-09-25 Today's Date: 11/23/2013 Time: 3790-2409 OT Time Calculation (min): 32 min  OT Assessment / Plan / Recommendation History of present illness HPI: This is a 63 year old man with a PMH of L lung adenocarcinoma s/p partial lobectomy without chemo/radiation in 2009 and history of TB, as well as multiple lacunar strokes with mild vascular dementia, CAD, HTN, previous GI bleed, depression and anxiety, and polysubstance abuse now on methadone, who presents with his daughter and son-in-law complaining of progressively worsening generalized weakness, sleep difficulty, and waxing and waning confusion and gait instability for the past two weeks. The history is per the patient and his family members. These symptoms have been associated with a cough productive of green and reddish sputum and mild shortness of breath at night over the same time period.     Clinical Impression   Pt presents with below problem list. Pt independent with ADLs, PTA (spoke with family during session). Feel pt will benefit from acute OT to increase independence prior to d/c. Recommending CIR for additional rehab.     OT Assessment  Patient needs continued OT Services    Follow Up Recommendations  CIR    Barriers to Discharge      Equipment Recommendations  Other (comment) (defer to next venue)    Recommendations for Other Services Rehab consult  Frequency  Min 2X/week    Precautions / Restrictions Precautions Precautions: Fall Restrictions Weight Bearing Restrictions: No   Pertinent Vitals/Pain No apparent distress.     ADL  Grooming: Wash/dry hands;Wash/dry face;Minimal assistance;Moderate assistance;Set up;Supervision/safety (supported sitting-setup/supervision for washing face/ pt requiring Min/Mod A while sitting EOB and washing face. Mod A while at sink washing hands) Where Assessed - Grooming: Supported  sitting;Unsupported sitting Upper Body Dressing: Maximal assistance Where Assessed - Upper Body Dressing: Supported sitting Lower Body Dressing: +1 Total assistance Where Assessed - Lower Body Dressing: Supported sit to Lobbyist: Moderate assistance Toilet Transfer Method: Sit to stand Toilet Transfer Equipment: Other (comment) (from chair and bed) Tub/Shower Transfer Method: Not assessed Equipment Used: Gait belt;Rolling walker;Other (comment) (oxygen) Transfers/Ambulation Related to ADLs: +2 Total A for safety with ambulation; Mod A /Max A for transfers. ADL Comments: Pt requiring Min/Mod A for balance when sitting EOB for balance-feet supported-washed face. Pt with slow processing during session requiring extra time. Washed hands at sink and washed face/mouth sitting EOB and in chair. OT assisted in washing face while pt supine in bed to help wake him up.    OT Diagnosis: Generalized weakness;Cognitive deficits  OT Problem List: Decreased strength;Decreased activity tolerance;Impaired balance (sitting and/or standing);Decreased cognition;Decreased safety awareness;Decreased knowledge of use of DME or AE;Decreased knowledge of precautions;Decreased coordination OT Treatment Interventions: Self-care/ADL training;DME and/or AE instruction;Therapeutic activities;Cognitive remediation/compensation;Patient/family education;Balance training;Therapeutic exercise   OT Goals(Current goals can be found in the care plan section) Acute Rehab OT Goals Patient Stated Goal: did not state OT Goal Formulation: With patient/family Time For Goal Achievement: 11/30/13 Potential to Achieve Goals: Good ADL Goals Pt Will Perform Grooming: with supervision;standing Pt Will Perform Upper Body Dressing: with supervision;sitting Pt Will Perform Lower Body Dressing: with supervision;sit to/from stand Pt Will Transfer to Toilet: with supervision;ambulating (3 in 1 over toilet) Pt Will Perform Toileting  - Clothing Manipulation and hygiene: with supervision;sit to/from stand Additional ADL Goal #1: Pt will perform bed mobility at Supervision level as precursor for ADLs.   Visit Information  Last OT Received On: 11/23/13 Assistance  Needed: +2 (for ambulation) History of Present Illness: HPI: This is a 63 year old man with a PMH of L lung adenocarcinoma s/p partial lobectomy without chemo/radiation in 2009 and history of TB, as well as multiple lacunar strokes with mild vascular dementia, CAD, HTN, previous GI bleed, depression and anxiety, and polysubstance abuse now on methadone, who presents with his daughter and son-in-law complaining of progressively worsening generalized weakness, sleep difficulty, and waxing and waning confusion and gait instability for the past two weeks. The history is per the patient and his family members. These symptoms have been associated with a cough productive of green and reddish sputum and mild shortness of breath at night over the same time period.         Prior [place] expects to be discharged to:: Private residence Living Arrangements: Other (Comment) (daughter; son in law) Available Help at Discharge: Family;Other (Comment) (daughter told OT that someone is with him 24/7;per PT eval, daughter lives across hallway?) Type of Home: Apartment Home Access: Stairs to enter [employer] of Steps: 10 steps Entrance Stairs-Rails: Right;Left;Can reach both Home Layout: One level Home Equipment: Walker - 2 wheels;Cane - single point Prior Function Level of Independence: Independent with assistive device(s) Gait / Transfers Assistance Needed: with cane Communication Communication: No difficulties (did not speak much) Dominant Hand: Right         Vision/Perception     Cognition  Cognition Arousal/Alertness: Lethargic Behavior During Therapy: WFL for tasks assessed/performed Overall Cognitive Status:  Impaired/Different from baseline Area of Impairment: Following commands;Problem solving;Orientation Orientation Level: Disoriented to;Time Following Commands: Follows one step commands with increased time Problem Solving: Slow processing;Requires verbal cues;Requires tactile cues;Difficulty sequencing    Extremity/Trunk Assessment Upper Extremity Assessment Upper Extremity Assessment: Generalized weakness Lower Extremity Assessment Lower Extremity Assessment: Defer to PT evaluation     Mobility Bed Mobility Overal bed mobility: Needs Assistance Bed Mobility: Supine to Sit;Sit to Supine Supine to sit: Max assist Sit to supine: Max assist;+2 for physical assistance (+2 to scoot to [facility]) General bed mobility comments: Assist to lift trunk; Assisted with legs when returning to supine and assisted in adjusting in bed.  Transfers Overall transfer level: Needs assistance Equipment used: Rolling walker (2 wheeled) Transfers: Sit to/from Stand Sit to Stand: Mod assist General transfer comment: Cues for technique. Mod A for transfers.     Exercise     Balance     End of Session OT - End of Session Equipment Utilized During Treatment: Gait belt;Rolling walker;Oxygen Activity Tolerance: Patient tolerated treatment well Patient left: in bed;with call bell/phone within reach;with bed alarm set Nurse Communication: Other (comment) (pt in bed with bed alarm set)  GO     Benito Mccreedy OTR/L [phone] 11/23/2013, 1:36 PM

## 2013-11-23 NOTE — Progress Notes (Signed)
Rehab Admissions Coordinator Note:  Patient was screened by Cleatrice Burke for appropriateness for an Inpatient Acute Rehab Consult.  At this time, we are recommending Inpatient Rehab consult once more medical workup completed.   Cleatrice Burke 11/23/2013, 12:04 PM  I can be reached at [phone].

## 2013-11-23 NOTE — Procedures (Signed)
Successful US guided right thoracentesis. Yielded 1.2 liters of serous fluid. Pt tolerated procedure well. No immediate complications.  Specimen was sent for labs. CXR ordered.  Tsosie Billing D PA-C 11/23/2013 10:02 AM

## 2013-11-23 NOTE — Progress Notes (Signed)
  I have seen and examined the patient, and reviewed the daily progress note by Corey Skains, MS 4 and discussed the care of the patient with them. Please see my progress note from 11/23/2013 for further details regarding assessment and plan.      Signed:  Jerene Pitch, MD 11/23/2013, 9:27 PM

## 2013-11-23 NOTE — Progress Notes (Signed)
Around 2 am this morning patient began to climb out of bed setting of bed alarm. Patient had removed his oxygen, which he wears at home and pulled tele and continuous pulse ox off. Patient was situated in bed and orientation was questioned. Pt was only alert to himself. Pt oxygen was placed and pulse ox was fixed. O2 sats read 59 percent. Patient was coached by RN to take several deep breaths. Oxygen saturation then increased to 99 percent. Pt is still confused. Provider on call for [facility] was paged. Call was returned by MD [facility]. MD made aware of situation and that RN staff was going to place patient in non restraint mitten to prevent him from pulling off is oxygen, until patient is not confused any more. Provider agree to this. This am pt is more alert but still has some confusion and wanting to take off his mitten. Pt was reminded to why mittens were on and that they would be removed as soon as he becomes more oriented. Will continue to monitor.   Lania Zawistowski J. Conley Canal RN

## 2013-11-23 NOTE — Progress Notes (Signed)
Subjective: Mr. Rainey was seen and examined at bedside. He is very sleepy today but does wake up and talks.   Objective: Vital signs in last 24 hours: Filed Vitals:   11/23/13 0607 11/23/13 0935 11/23/13 0945 11/23/13 0955  BP: 157/104 164/116 176/112 157/112  Pulse: 90     Temp: 98.4 F (36.9 C)     TempSrc: Oral     Resp: 20     Height:      Weight: 153 lb 3.5 oz (69.5 kg)     SpO2: 100%      Weight change: 4 lb 12.8 oz (2.177 kg)  Intake/Output Summary (Last 24 hours) at 11/23/13 2044 Last data filed at 11/23/13 0646  Gross per 24 hour  Intake 515.17 ml  Output    320 ml  Net 195.17 ml   Vitals reviewed. General: sleeping, NAD HEENT: PERRL, EOMI Cardiac: RRR Pulm:limited exam as patient hard to arouse Abd: soft, nontender, nondistended, BS present Ext: warm and well perfused, no pedal edema, darkened petechia on plantar surface of b/l feet, palpable crepitus like feel on left hip/upper thigh but non-tender to deep palpation Neuro: sleeping but does open eyes and follows commands with lots of coaching.   Lab Results: Basic Metabolic Panel:  Recent Labs Lab 11/22/13 0112 11/22/13 1000 11/23/13 0505  NA 141  --  141  K 3.6*  --  4.5  CL 104  --  104  CO2 24  --  21  GLUCOSE 85  --  79  BUN 15  --  14  CREATININE 0.89  --  0.88  CALCIUM 8.2*  --  8.1*  MG  --  1.9  --    Liver Function Tests:  Recent Labs Lab 11/22/13 0112 11/23/13 0505  AST 211* 171*  ALT 112* 94*  ALKPHOS 146* 139*  BILITOT 0.7 1.2  PROT 7.7 7.5  7.5  ALBUMIN 2.5* 2.4*    Recent Labs Lab 11/19/13 0139  AMMONIA 35   CBC:  Recent Labs Lab 11/21/13 0942 11/22/13 0112  WBC 7.9 7.7  NEUTROABS 5.9  --   HGB 8.5* 8.2*  HCT 28.4* 27.5*  MCV 67.0* 66.4*  PLT 319 336   Cardiac Enzymes:  Recent Labs Lab 11/21/13 1145 11/22/13 0112  TROPONINI <0.30 <0.30   BNP:  Recent Labs Lab 11/18/13 2303  PROBNP 1975.0*   CBG:  Recent Labs Lab 11/22/13 1230  11/22/13 1532 11/22/13 2040 11/23/13 0003 11/23/13 0416 11/23/13 1207  GLUCAP 70 79 91 90 84 92   Urine Drug Screen: Drugs of Abuse     Component Value Date/Time   LABOPIA NONE DETECTED 11/22/2013 0702   COCAINSCRNUR NONE DETECTED 11/22/2013 0702   LABBENZ NONE DETECTED 11/22/2013 0702   AMPHETMU NONE DETECTED 11/22/2013 0702   THCU NONE DETECTED 11/22/2013 0702   LABBARB NONE DETECTED 11/22/2013 0702    Alcohol Level:  Recent Labs Lab 11/19/13 0139  ETH <11   Urinalysis:  Recent Labs Lab 11/21/13 1236  COLORURINE AMBER*  LABSPEC 1.029  PHURINE 5.5  GLUCOSEU NEGATIVE  HGBUR NEGATIVE  BILIRUBINUR SMALL*  KETONESUR NEGATIVE  PROTEINUR NEGATIVE  UROBILINOGEN 2.0*  NITRITE NEGATIVE  LEUKOCYTESUR NEGATIVE   Studies/Results: Dg Chest 1 View  11/23/2013   CLINICAL DATA:  Status post right thoracentesis  EXAM: CHEST - 1 VIEW  COMPARISON:  CT chest dated 11/21/2013  FINDINGS: No definite pneumothorax is seen status post right thoracentesis.  Mild patchy right lower lobe opacity, suspicious for pneumonia.  Small loculated/layering left pleural effusion.  Cardiomegaly.  Residual contrast in the mid esophagus from recent fluoroscopic study, reflecting esophageal dysmotility and/or reflux.  IMPRESSION: No definite pneumothorax is seen status post right thoracentesis.  Small loculated/layering left pleural effusion.  Mild patchy right lower lobe opacity, suspicious for pneumonia.   Electronically Signed   By: Julian Hy M.D.   On: 11/23/2013 10:46   Ct Chest W Contrast  11/21/2013   CLINICAL DATA:  Known pneumonia, with productive cough and generalized weakness.  EXAM: CT CHEST WITH CONTRAST  TECHNIQUE: Multidetector CT imaging of the chest was performed during intravenous contrast administration.  CONTRAST:  43mL OMNIPAQUE IOHEXOL 300 MG/ML  SOLN  COMPARISON:  Chest radiograph performed earlier today at 12:07 p.m., and CT of the chest performed 02/26/2013  FINDINGS: Hazy  peribronchial opacities are seen bilaterally, more prominent at the right lung base, with associated bronchiectasis. More peripheral opacities are seen within the left lung. Small to moderate right and small left pleural effusions are seen, mildly loculated on the left. There is associated scarring at the lung bases, with scattered blebs seen at the lung apices. Some of the opacities are relatively nodular in appearance. A 4 mm nodule is seen in the left lower lobe (image 46 of 69). There is no evidence of pneumothorax.  Scattered coronary artery calcifications are seen. The esophagus is partially filled with fluid, raising concern for esophageal dysmotility. Mildly enlarged right paratracheal, subcarinal and azygoesophageal recess nodes are seen, measuring up to 1.3 cm in short axis. Trace pericardial fluid remains within normal limits. The great vessels are grossly unremarkable in appearance. The thyroid gland is unremarkable in appearance. No axillary lymphadenopathy is seen.  A 2.9 x 1.2 cm collection of fluid attenuation along the anterior aspect of the liver is only minimally changed from prior study, and is likely benign. Trace fluid is seen tracking about the liver. The visualized portions of the liver and spleen are otherwise unremarkable.  IMPRESSION: 1. Hazy bilateral peribronchial opacities, more prominent at the right lung base, with associated bronchiectasis. More peripheral opacities seen within the left lung. Small to moderate right and small left pleural effusions seen, mildly loculated on left. This raises concern for multifocal pneumonia, possibly atypical in nature. 2. Scarring at the lung bases, with scattered blebs at the lung apices. 4 mm nodule at the left lower lung lobe; this region was not well assessed on prior studies due to airspace opacification. If the patient is at high risk for bronchogenic carcinoma, follow-up chest CT at 1 year is recommended. If the patient is at low risk, no  follow-up is needed. This recommendation follows the consensus statement: Guidelines for Management of Small Pulmonary Nodules Detected on CT Scans: A Statement from the [employer] as published in Radiology 2005; 237:395-400. 3. Scattered coronary artery calcifications seen. 4. Esophagus partially filled with fluid, raising concern for esophageal dysmotility. 5. Mildly enlarged mediastinal nodes seen, measuring up to 1.3 cm in short axis. Several of these are similar in size on the prior study, and are nonspecific in appearance. 6. 2.9 x 1.2 cm collection of fluid attenuation along the anterior aspect of the liver is only minimally changed from the prior study, and likely reflects a cyst. 7. Trace ascites noted about the liver.   Electronically Signed   By: Garald Balding M.D.   On: 11/21/2013 21:43   Dg Esophagus  11/23/2013   CLINICAL DATA:  Difficulty swallowing. Personal history of lung cancer. Generalized weakness. Dysphagia.  EXAM: ESOPHOGRAM/BARIUM SWALLOW  TECHNIQUE: Single contrast examination was performed using  thin barium.  COMPARISON:  Chest CT 11/21/2013.  FLUOROSCOPY TIME:  3 min 15 seconds  FINDINGS: Study is technically suboptimal. The patient was unable to cooperate with swallowing to adequately distend the esophagus. Only about 4 oz of contrast was ingested throughout the study despite instructions. There was profound esophageal stasis. No gross fixed strictures were identified. Tertiary contractions were observed. Esophageal diverticulum was present in the distal esophagus, just proximal to the gastroesophageal junction. 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 Because of patient debilitation, we could not repositioned patient to optimally visualize this distal esophageal diverticulum. No hiatal hernia was identified. There was not adequate distension to assess for reflux. The epiphrenic diverticulum is best visualized on series 20.  IMPRESSION: Technically  suboptimal study due to patient debilitation, inability to cooperate with swallowing and small amount of ingested contrast. No focal strictures or esophageal mass lesions identified. Small epiphrenic diverticulum. Marked esophageal stasis. Tertiary contractions associated with nonspecific esophageal dysmotility disorder.   Electronically Signed   By: Dereck Ligas M.D.   On: 11/23/2013 09:43   US Thoracentesis Asp Pleural Space W/img Guide  11/23/2013   CLINICAL DATA:  Pleural effusion, shortness of breath, request for diagnostic and therapeutic thoracentesis  EXAM: ULTRASOUND GUIDED Right THORACENTESIS  COMPARISON:  None.  FINDINGS: A total of approximately 1.2 liters of serous fluid was removed. A fluid sample was sent for laboratory analysis.  IMPRESSION: Successful ultrasound guided right thoracentesis yielding 1.2 liters of pleural fluid.  Read By:  Tsosie Billing PA-C  PROCEDURE: An ultrasound guided thoracentesis was thoroughly discussed with the patient and questions answered. The benefits, risks, alternatives and complications were also discussed. The patient understands and wishes to proceed with the procedure. Written consent was obtained.  Ultrasound was performed to localize and mark an adequate pocket of fluid in the right chest. The area was then prepped and draped in the normal sterile fashion. 1% Lidocaine was used for local anesthesia. Under ultrasound guidance a 19 gauge Yueh catheter was introduced. Thoracentesis was performed. The catheter was removed and a dressing applied.  Complications:  none.   Electronically Signed   By: Arne Cleveland M.D.   On: 11/23/2013 10:05   Medications: I have reviewed the patient's current medications. Scheduled Meds: . antiseptic oral rinse  15 mL Mouth Rinse q12n4p  . chlorhexidine  15 mL Mouth Rinse BID  . heparin  5,000 Units Subcutaneous Q8H  . ipratropium-albuterol  3 mL Nebulization TID  . multivitamin with minerals  1 tablet Oral Daily  .  piperacillin-tazobactam (ZOSYN)  IV  3.375 g Intravenous Q8H  . sodium chloride  3 mL Intravenous Q12H   Continuous Infusions: . dextrose 5 % and 0.45% NaCl 20 mL/hr at 11/23/13 0038   PRN Meds:. Assessment/Plan: Principal Problem:   CAP (community acquired pneumonia) Active Problems:   Anemia   Acute encephalopathy   Generalized weakness   Adenocarcinoma, lung s/p partial lobectomy   Dysphagia, unspecified(787.20)   Chronic combined systolic and diastolic congestive heart failure   Substance abuse   Essential hypertension, benign   Unspecified constipation Mr. Dinino is a 63 year old African American male with combined CHF, lung adenocarcinoma s/p partial lobectomy, recurrent pneumonias, and substance abuse admitted for CAP and acute encephalopathy   ?CAP vs. aspiration given dysphagia and limited swallowing ability.  On IV Zosyn.  NPO for now due to limited swallowing but can get meds. S/p thoracentesis  today. -continue zosyn -oxygen prn, keep o2 sats >92%  -tylenol prn fever  -blood cultures x2 NGTD -sputum culture to be collected -s/p thoracentesis labs pending -Respiratory virus panel neg -HIV neg -legionella antigen and strep pneumoniae urinary antigen negative  -based on CT findings and patient complicated respiratory history, will discuss with pulmonary for possible bronchoscopy and further evaluation -duoneb q8h  -check pulse ox with ambulation   Acute encephalopathy--very drowsy today, unclear why.  Could be secondary to clonidine patch vs. ativan at night and recent methadone dose.  -neuro checks  -held methadone for today -fall and seizure precautions  -EEG -NPO for now, will need to re-evaluate with SLP tomorrow based on mental status -PT/OT--CIR -may need to rescan if mental status does not improve -meet with family to discuss long term care  Iron deficiency anemia--Hb 8.5 with MCV 67 on admission, appears stable from 04/2013. Denies any hematuria or bloody  BM. Not on iron supplementation at home.  -trend cbc  -recommend starting iron supplementation when evaluated by speech  -consider PPI empiric therapy  Adenocarcinoma of lung s/p left partial lobectomy in [place] (Dr. Cydney Ok once by Dr. Julien Nordmann at [place] [facility] in May 2014 for ?recurrency and did not follow up for recurrent imaging. CT chest on admission shows b/l effusions, mild loculation on left ?multifocal PNA, 59mm nodule at LLL lobe that will need to be follow up in 1 year, mildly enlarged mediastinal nodes.  -pulmonary consultation, may need bronch -will need outpatient oncology follow up   Dysphagia--barium study done today, suboptimal study, inability to cooperate. No focal stricture or esophageal mass. Small epiphrenic diverticulum and marked esophageal statis. Esophageal dysmotility disorder.  -NPO for now -GI signed off today, no role for endoscopy. Consider tube feeds until stronger?   Chronic combined systolic and diastolic heart failure--per echo 03/2013: EF 35-40%, diffuse hypokinesis, moderately reduced systolic function, grade 2 diastolic dysfunction, diffuse hypokinesis. Home medications include ASA 81mg  qd, hydralazine 10mg  tid, and lisinopril 10mg  qd. Weight today 153lb's.  -holding po meds for now  HTN--BP on admission 153/97. Home medications include lisinopril 10mg  qd and hydralazine 10mg  TID.  NPO for now. 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 -HIV neg -UDS neg -continue methadone, need to verify dose, but will continue 50mg  for now  Constipation--reports lack of BM for several days -miralax -enema given yesterday  Diet: NPO for now DVT Ppx: Heparin Dispo: Disposition is deferred at this time, awaiting improvement of current medical problems.  Anticipated discharge in approximately 2-3 day(s).   The patient does have a current PCP (No Pcp Per  Patient) and does not need an [facility] hospital follow-up appointment after discharge. Will follow up with [facility].    The patient does not have transportation limitations that hinder transportation to clinic appointments.  Services Needed at time of discharge: Y = Yes, Blank = No PT:   OT:   RN:   Equipment:   Other:     LOS: 2 days   Jerene Pitch, MD 11/23/2013, 8:44 PM

## 2013-11-23 NOTE — Consult Note (Signed)
[facility] Consult: 10:30 AM 11/23/2013  LOS: 2 days    Referring Provider: Dr Marinda Elk Primary Care Physician:  Do not think there is one Primary Gastroenterologist:  unassigned  Reason for Consultation:  Dysphagia.    HPI: Mark [name] is a 63 y.o. male.  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  3 unit GI bleed in 2009, no details as to when, where, what but no Epic records re a GI bleed. Someone in family says no source for bleeding located. Hx of benign liver cyst Methaodone maintenance for hx of substance abuse.   Admitted 2 days ago with AMS and ? Of seizures.  CT head with atrophy, small vessel infarcts, nothing acute.   Dysphagia noted.  On esophagram today has marked dysmotility.  No family in room and pt unable to verbalize at present so unclear duration of the dysphagia.     Past Medical History  Diagnosis Date  . Essential hypertension, benign 02/26/2013  . Liver lesion     a. Previously biopsy-proven to be a cyst (per records from a hospital in [place]).  . Hypoxia     a. Adm 02/2013, required home O2.  . Drug abuse   . Depression   . Seizure disorder     "after hip OR 02/2012 once I got home" (04/01/2013)  . GI bleed     a. 2009 at time of stroke - received 3 u blood, no source found per pt  . HTN (hypertension)   . On home oxygen therapy     2.5L "usually use it at night" (04/01/2013)  . Tuberculosis   . Positive TB test   . History of blood transfusion 2009    "don't know what it was related to" (04/01/2013)  . Stroke 2009    a. Pt reports several, last in 2009. b. Residual L sided weakness.  . Anxiety   . Adenocarcinoma, lung     a. Dx 2013, underwent left lower lobectomy under the care of  Dr.Lapunzina in [place] for a stage I non-small cell lung CA.; "went to [facility] ~ 06/2012 and they couldn't find anything" (04/01/2013)  . Pneumonia   . Coronary atherosclerosis of native coronary artery     Past Surgical History  Procedure Laterality Date  . Hip arthroplasty Left 02/2012  . Lung biopsy Left 07/15/2012    "LLL" (04/01/2013)  . Lung removal, partial Left     /notes 02/26/2013  (04/01/2013)    Prior to Admission medications   Medication Sig Start Date End Date Taking? Authorizing Provider  aspirin EC 81 MG EC tablet Take 1 tablet (81 mg total) by mouth daily. 03/04/13  Yes Estela Leonie Green, MD  folic acid (FOLVITE) 1 MG tablet Take 1 tablet (1 mg total) by mouth daily. 04/05/13  Yes Estela Leonie Green, MD  hydrALAZINE (APRESOLINE) 10 MG tablet Take 1 tablet (10 mg total) by mouth 3 (three) times daily. 11/06/13  Yes Montine Circle, PA-C  levofloxacin (LEVAQUIN) 750 MG tablet Take 1 tablet (750 mg total) by mouth daily. X 5 days 11/19/13  Yes Elyn Peers, MD  lisinopril (PRINIVIL,ZESTRIL) 10 MG tablet Take 10 mg by mouth daily.   Yes Historical Provider, MD  METHADONE HCL PO Take 63 mg by mouth daily. As directed   Yes Historical Provider, MD  polyethylene glycol powder (GLYCOLAX/MIRALAX) powder Take 17 g by mouth 2 (two) times daily. Until daily soft stools  OTC 11/06/13  Yes Montine Circle, PA-C  potassium chloride (K-DUR) 10 MEQ tablet Take 1 tablet (10 mEq total) by mouth once. 11/06/13  Yes Montine Circle, PA-C    Scheduled Meds: . antiseptic oral rinse  15 mL Mouth Rinse q12n4p  . aspirin EC  81 mg Oral Daily  . chlorhexidine  15 mL Mouth Rinse BID  . cloNIDine  0.2 mg Transdermal Weekly  . folic acid  1 mg Oral Daily  . heparin  5,000 Units Subcutaneous Q8H  . ipratropium-albuterol  3 mL Nebulization TID  . methadone  50 mg Oral Daily  . multivitamin with minerals  1 tablet Oral Daily  . piperacillin-tazobactam (ZOSYN)  IV  3.375 g Intravenous Q8H  .  sodium chloride  3 mL Intravenous Q12H   Infusions: . dextrose 5 % and 0.45% NaCl 20 mL/hr at 11/23/13 0038   PRN Meds: LORazepam, LORazepam   Allergies as of 11/21/2013  . (No Known Allergies)    Family History  Problem Relation Age of Onset  . Stroke    . CAD Neg Hx     History   Social History  . Marital Status: Widowed    Spouse Name: N/A    Number of Children: N/A  . Years of Education: N/A   Occupational History  . Not on file.   Social History Main Topics  . Smoking status: Former Smoker -- 0.50 packs/day for 42 years    Types: Cigarettes    Quit date: 02/27/2012  . Smokeless tobacco: Never Used  . Alcohol Use: 29.4 oz/week    14 Cans of beer, 35 Shots of liquor per week     Comment: 04/01/2013 "~ 1/2 pint /day of rum plus 2 16oz beers/day"  . Drug Use: No     Comment: Prior h/o cocaine and heroin. Currently on methadone.  . Sexual Activity: Not Currently   Other Topics Concern  . Not on file   Social History Narrative  . No narrative on file    REVIEW OF SYSTEMS: Unable to obtain, pt not verbal at present  PHYSICAL EXAM: Vital signs in last 24 hours: Filed Vitals:   11/23/13 0955  BP: 157/112  Pulse:   Temp:   Resp:    Wt Readings from Last 3 Encounters:  11/23/13 69.5 kg (153 lb 3.5 oz)  11/18/13 68.947 kg (152 lb)  11/06/13 68.947 kg (152 lb)    General: frail, cachectic AAM in poor healthe Head:  No swelling or asymmetry  Eyes:  No icterus or pallor Ears:  Seems to hear me  Nose:  No discharge Mouth:  Pt will not open mouth Neck:  No mass, no JVD Lungs:  Clear bil Heart: RRR Abdomen:  Soft, thin,  No HSM, not tender.  BS present.   Rectal: deferred   Musc/Skeltl: no joint pain or swelling Extremities:  No CCE.  Feet warm  Neurologic:  Opens eyes to voice.  Minimally follows commands, obtunded Skin:  No rash or sores but extremely dry Tattoos:  None  seen Nodes:  No cervical adenopathy   Psych:  Obtunded.   Intake/Output  from previous day: 01/25 0701 - 01/26 0700 In: 905.2 [I.V.:905.2] Out: 470 [Urine:470] Intake/Output this shift:    LAB RESULTS:  Recent Labs  11/21/13 0942 11/22/13 0112  WBC 7.9 7.7  HGB 8.5* 8.2*  HCT 28.4* 27.5*  PLT 319 336   BMET Lab Results  Component Value Date   NA 141 11/23/2013   NA 141 11/22/2013   NA 142 11/21/2013   K 4.5 11/23/2013   K 3.6* 11/22/2013   K 4.2 11/21/2013   CL 104 11/23/2013   CL 104 11/22/2013   CL 104 11/21/2013   CO2 21 11/23/2013   CO2 24 11/22/2013   CO2 26 11/21/2013   GLUCOSE 79 11/23/2013   GLUCOSE 85 11/22/2013   GLUCOSE 82 11/21/2013   BUN 14 11/23/2013   BUN 15 11/22/2013   BUN 17 11/21/2013   CREATININE 0.88 11/23/2013   CREATININE 0.89 11/22/2013   CREATININE 1.01 11/21/2013   CALCIUM 8.1* 11/23/2013   CALCIUM 8.2* 11/22/2013   CALCIUM 8.5 11/21/2013   LFT  Recent Labs  11/22/13 0112 11/23/13 0505  PROT 7.7 7.5  7.5  ALBUMIN 2.5* 2.4*  AST 211* 171*  ALT 112* 94*  ALKPHOS 146* 139*  BILITOT 0.7 1.2   PT/INR Lab Results  Component Value Date   INR 1.06 04/03/2013   INR 1.09 02/27/2013   INR 1.05 02/26/2013   Hepatitis Panel  Recent Labs  11/22/13 0900  HEPBSAG NEGATIVE  HCVAB Reactive*  HEPAIGM NON REACTIVE  HEPBIGM NON REACTIVE   C-Diff No components found with this basename: cdiff   Lipase     Component Value Date/Time   LIPASE 20 03/01/2013 1024    Drugs of Abuse     Component Value Date/Time   LABOPIA NONE DETECTED 11/22/2013 0702   COCAINSCRNUR NONE DETECTED 11/22/2013 0702   LABBENZ NONE DETECTED 11/22/2013 0702   AMPHETMU NONE DETECTED 11/22/2013 0702   THCU NONE DETECTED 11/22/2013 0702   LABBARB NONE DETECTED 11/22/2013 4259     RADIOLOGY STUDIES: Dg Chest 2 View 11/21/2013   CLINICAL DATA:  One week history of shortness of breath  EXAM: CHEST  2 VIEW  COMPARISON:  Prior chest x-ray 11/18/2013 ; prior CT chest/abdomen/ pelvis 02/26/2013  FINDINGS: Slightly increased ill-defined patchy airspace opacities  predominantly in the right lung base but also in the bilateral mid lungs. A persistent moderate right layering pleural effusion. Also likely a small left pleural effusion versus pleural thickening. Unchanged cardiac and mediastinal contours. Atherosclerotic calcification noted in the transverse aorta. No acute osseous abnormality.  IMPRESSION: 1. Similar to slight interval progression of bilateral patchy airspace disease worst in the right lower lobe. Findings remain concerning for a multifocal infectious/inflammatory process. 2. Small bilateral pleural effusions.   Electronically Signed   By: Jacqulynn Cadet M.D.   On: 11/21/2013 13:07   Ct Chest W Contrast 11/21/2013   CLINICAL DATA:  Known pneumonia, with productive cough and generalized weakness.  EXAM: CT CHEST WITH CONTRAST  TECHNIQUE: Multidetector CT imaging of the chest was performed during intravenous contrast administration.  CONTRAST:  26mL OMNIPAQUE IOHEXOL 300 MG/ML  SOLN  COMPARISON:  Chest radiograph performed earlier today at 12:07 p.m., and CT of the chest performed 02/26/2013  FINDINGS: Hazy peribronchial opacities are seen bilaterally, more prominent at the right lung base, with associated bronchiectasis. More peripheral opacities are seen within the left lung. Small to moderate right  and small left pleural effusions are seen, mildly loculated on the left. There is associated scarring at the lung bases, with scattered blebs seen at the lung apices. Some of the opacities are relatively nodular in appearance. A 4 mm nodule is seen in the left lower lobe (image 46 of 69). There is no evidence of pneumothorax.  Scattered coronary artery calcifications are seen. The esophagus is partially filled with fluid, raising concern for esophageal dysmotility. Mildly enlarged right paratracheal, subcarinal and azygoesophageal recess nodes are seen, measuring up to 1.3 cm in short axis. Trace pericardial fluid remains within normal limits. The great vessels  are grossly unremarkable in appearance. The thyroid gland is unremarkable in appearance. No axillary lymphadenopathy is seen.  A 2.9 x 1.2 cm collection of fluid attenuation along the anterior aspect of the liver is only minimally changed from prior study, and is likely benign. Trace fluid is seen tracking about the liver. The visualized portions of the liver and spleen are otherwise unremarkable.  IMPRESSION: 1. Hazy bilateral peribronchial opacities, more prominent at the right lung base, with associated bronchiectasis. More peripheral opacities seen within the left lung. Small to moderate right and small left pleural effusions seen, mildly loculated on left. This raises concern for multifocal pneumonia, possibly atypical in nature. 2. Scarring at the lung bases, with scattered blebs at the lung apices. 4 mm nodule at the left lower lung lobe; this region was not well assessed on prior studies due to airspace opacification. If the patient is at high risk for bronchogenic carcinoma, follow-up chest CT at 1 year is recommended. If the patient is at low risk, no follow-up is needed. This recommendation follows the consensus statement: Guidelines for Management of Small Pulmonary Nodules Detected on CT Scans: A Statement from the [employer] as published in Radiology 2005; 237:395-400. 3. Scattered coronary artery calcifications seen. 4. Esophagus partially filled with fluid, raising concern for esophageal dysmotility. 5. Mildly enlarged mediastinal nodes seen, measuring up to 1.3 cm in short axis. Several of these are similar in size on the prior study, and are nonspecific in appearance. 6. 2.9 x 1.2 cm collection of fluid attenuation along the anterior aspect of the liver is only minimally changed from the prior study, and likely reflects a cyst. 7. Trace ascites noted about the liver.   Electronically Signed   By: Garald Balding M.D.   On: 11/21/2013 21:43   Dg Esophagus 11/23/2013  FINDINGS: Study is  technically suboptimal. The patient was unable to cooperate with swallowing to adequately distend the esophagus. Only about 4 oz of contrast was ingested throughout the study despite instructions. There was profound esophageal stasis. No gross fixed strictures were identified. Tertiary contractions were observed. Esophageal diverticulum was present in the distal esophagus, just proximal to the gastroesophageal junction. 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 Because of patient debilitation, we could not repositioned patient to optimally visualize this distal esophageal diverticulum. No hiatal hernia was identified. There was not adequate distension to assess for reflux. The epiphrenic diverticulum is best visualized on series 20.  IMPRESSION: Technically suboptimal study due to patient debilitation, inability to cooperate with swallowing and small amount of ingested contrast. No focal strictures or esophageal mass lesions identified. Small epiphrenic diverticulum. Marked esophageal stasis. Tertiary contractions associated with nonspecific esophageal dysmotility disorder.   Electronically Signed   By: Dereck Ligas M.D.   On: 11/23/2013 09:43   US Thoracentesis Asp Pleural Space W/img Guide 11/23/2013   CLINICAL DATA:  Pleural  effusion, shortness of breath, request for diagnostic and therapeutic thoracentesis  EXAM: ULTRASOUND GUIDED Right THORACENTESIS  COMPARISON:  None.  FINDINGS: A total of approximately 1.2 liters of serous fluid was removed. A fluid sample was sent for laboratory analysis.  IMPRESSION: Successful ultrasound guided right thoracentesis yielding 1.2 liters of pleural fluid.  Read By:  Tsosie Billing PA-C  PROCEDURE: An ultrasound guided thoracentesis was thoroughly discussed with the patient and questions answered. The benefits, risks, alternatives and complications were also discussed. The patient understands and wishes to proceed with the procedure. Written consent was  obtained.  Ultrasound was performed to localize and mark an adequate pocket of fluid in the right chest. The area was then prepped and draped in the normal sterile fashion. 1% Lidocaine was used for local anesthesia. Under ultrasound guidance a 19 gauge Yueh catheter was introduced. Thoracentesis was performed. The catheter was removed and a dressing applied.  Complications:  none.   Electronically Signed   By: Arne Cleveland M.D.   On: 11/23/2013 10:05    ENDOSCOPIC STUDIES: Nothing found in Epic  IMPRESSION:    *  Dysphagia, odynophagia. 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  *  Polysubstance abuse *  Iron deficiency anemia with microcytosis..  FOB negative. No transfusion to date.  *  Hx GIB in 2009 requiring transfusion in 2009.  No details.  *  Hepatitis C +, elevated transaminases (normal in 03/2013). Recent consumption of ETOH not defined.  02/2013 ultrasound and CT scan with no cirrhosis, just simple cholecystitis.  *  CAP.  Multifocal PNA per CT *  S/p resection of lung cancer. With the pneumonia, unable to determine if he has recurrent cancer but has LLL nodule on CT.  Has not complied with follow up with oncology.  *  Mental status changes, gait disorder. .  *  CHF.  EF 25%   PLAN:     *  No role for endoscopy.  There is no stricture to dilate.  *  If you end up deciding you want G tube, interventional radiology can place this. Follow dietary guidelines outlined by SLP if pt alert enough to take po which he presently is not.  *  Given overall appearance of pt and multiple problems, have you considered Palliative goals of care consult? *  ? Repeat abdominal ultrasound to assess the LFTs and hx gallstones. Or Ct to assess for liver mets from lung cancer.      Azucena Freed  11/23/2013, 10:30 AM Pager: [phone]  GI ATTENDING  History, laboratories, x-rays reviewed. Patient personally seen and examined. Agree with H&P as outlined above. We  are asked to see patient regarding dysphagia. He is very ill with multiple problems and marked debility. Esophagram shows evidence for dysmotility, but not obstruction. No indication for endoscopy. Empiric PPI is reasonable given a report of prior GI bleed and current anemia. In terms of nutrition, you could consider nasogastric tube feeding to see if he gets stronger with time and is able to take oral nutrition at some point. It appears that long-term patient treatment goals need to be addressed. Sorry we could not offer more. Will sign off  John N. Geri Seminole., M.D. [facility] Division of Gastroenterology

## 2013-11-24 ENCOUNTER — Inpatient Hospital Stay (HOSPITAL_COMMUNITY): Payer: Medicaid Other

## 2013-11-24 DIAGNOSIS — M25559 Pain in unspecified hip: Secondary | ICD-10-CM

## 2013-11-24 DIAGNOSIS — R259 Unspecified abnormal involuntary movements: Secondary | ICD-10-CM

## 2013-11-24 DIAGNOSIS — R599 Enlarged lymph nodes, unspecified: Secondary | ICD-10-CM

## 2013-11-24 DIAGNOSIS — I5041 Acute combined systolic (congestive) and diastolic (congestive) heart failure: Secondary | ICD-10-CM

## 2013-11-24 DIAGNOSIS — J690 Pneumonitis due to inhalation of food and vomit: Secondary | ICD-10-CM | POA: Diagnosis present

## 2013-11-24 DIAGNOSIS — I10 Essential (primary) hypertension: Secondary | ICD-10-CM

## 2013-11-24 DIAGNOSIS — D649 Anemia, unspecified: Secondary | ICD-10-CM

## 2013-11-24 DIAGNOSIS — R059 Cough, unspecified: Secondary | ICD-10-CM

## 2013-11-24 DIAGNOSIS — R0602 Shortness of breath: Secondary | ICD-10-CM

## 2013-11-24 DIAGNOSIS — R591 Generalized enlarged lymph nodes: Secondary | ICD-10-CM

## 2013-11-24 DIAGNOSIS — F1910 Other psychoactive substance abuse, uncomplicated: Secondary | ICD-10-CM

## 2013-11-24 DIAGNOSIS — J90 Pleural effusion, not elsewhere classified: Secondary | ICD-10-CM

## 2013-11-24 DIAGNOSIS — R911 Solitary pulmonary nodule: Secondary | ICD-10-CM

## 2013-11-24 DIAGNOSIS — G9340 Encephalopathy, unspecified: Secondary | ICD-10-CM

## 2013-11-24 DIAGNOSIS — I2510 Atherosclerotic heart disease of native coronary artery without angina pectoris: Secondary | ICD-10-CM

## 2013-11-24 DIAGNOSIS — I509 Heart failure, unspecified: Secondary | ICD-10-CM

## 2013-11-24 DIAGNOSIS — F1010 Alcohol abuse, uncomplicated: Secondary | ICD-10-CM

## 2013-11-24 DIAGNOSIS — R05 Cough: Secondary | ICD-10-CM

## 2013-11-24 LAB — GLUCOSE, CAPILLARY
GLUCOSE-CAPILLARY: 72 mg/dL (ref 70–99)
GLUCOSE-CAPILLARY: 82 mg/dL (ref 70–99)
Glucose-Capillary: 81 mg/dL (ref 70–99)
Glucose-Capillary: 79 mg/dL (ref 70–99)
Glucose-Capillary: 127 mg/dL — ABNORMAL HIGH (ref 70–99)
Glucose-Capillary: 144 mg/dL — ABNORMAL HIGH (ref 70–99)

## 2013-11-24 LAB — BASIC METABOLIC PANEL
BUN: 18 mg/dL (ref 6–23)
CO2: 23 mEq/L (ref 19–32)
Calcium: 8 mg/dL — ABNORMAL LOW (ref 8.4–10.5)
Chloride: 106 mEq/L (ref 96–112)
Creatinine, Ser: 0.99 mg/dL (ref 0.50–1.35)
GFR, EST NON AFRICAN AMERICAN: 85 mL/min — AB (ref >90)
Glucose, Bld: 78 mg/dL (ref 70–99)
POTASSIUM: 4.2 meq/L (ref 3.7–5.3)
Sodium: 141 mEq/L (ref 137–147)

## 2013-11-24 LAB — CBC WITH DIFFERENTIAL/PLATELET
Basophils Absolute: 0 10*3/uL (ref 0.0–0.1)
Basophils Relative: 0 % (ref 0–1)
EOS ABS: 0.5 10*3/uL (ref 0.0–0.7)
Eosinophils Relative: 6 % — ABNORMAL HIGH (ref 0–5)
HCT: 29 % — ABNORMAL LOW (ref 39.0–52.0)
Hemoglobin: 8.6 g/dL — ABNORMAL LOW (ref 13.0–17.0)
LYMPHS PCT: 21 % (ref 12–46)
Lymphs Abs: 1.7 10*3/uL (ref 0.7–4.0)
MCH: 19.6 pg — AB (ref 26.0–34.0)
MCHC: 29.7 g/dL — ABNORMAL LOW (ref 30.0–36.0)
MCV: 66.2 fL — ABNORMAL LOW (ref 78.0–100.0)
Monocytes Absolute: 0.7 10*3/uL (ref 0.1–1.0)
Monocytes Relative: 9 % (ref 3–12)
NEUTROS PCT: 64 % (ref 43–77)
Neutro Abs: 5 10*3/uL (ref 1.7–7.7)
Platelets: 356 10*3/uL (ref 150–400)
RBC: 4.38 MIL/uL (ref 4.22–5.81)
RDW: 18.1 % — AB (ref 11.5–15.5)
WBC: 7.9 10*3/uL (ref 4.0–10.5)

## 2013-11-24 LAB — LACTATE DEHYDROGENASE: LDH: 381 U/L — ABNORMAL HIGH (ref 94–250)

## 2013-11-24 MED ORDER — FOLIC ACID 1 MG PO TABS
1.0000 mg | ORAL_TABLET | Freq: Every day | ORAL | Status: DC
  Administered 2013-11-24 – 2013-11-28 (×5): 1 mg via ORAL
  Filled 2013-11-24 (×5): qty 1

## 2013-11-24 MED ORDER — ASPIRIN EC 81 MG PO TBEC
81.0000 mg | DELAYED_RELEASE_TABLET | Freq: Every day | ORAL | Status: DC
  Administered 2013-11-24 – 2013-11-28 (×5): 81 mg via ORAL
  Filled 2013-11-24 (×5): qty 1

## 2013-11-24 MED ORDER — DEXTROSE 50 % IV SOLN: 1.0000 | Freq: Once | INTRAVENOUS | Status: DC

## 2013-11-24 MED ORDER — HYDRALAZINE HCL 10 MG PO TABS
10.0000 mg | ORAL_TABLET | Freq: Three times a day (TID) | ORAL | Status: DC
  Administered 2013-11-24 – 2013-11-28 (×12): 10 mg via ORAL
  Filled 2013-11-24 (×15): qty 1

## 2013-11-24 MED ORDER — LISINOPRIL 10 MG PO TABS
10.0000 mg | ORAL_TABLET | Freq: Every day | ORAL | Status: DC
  Administered 2013-11-24 – 2013-11-26 (×3): 10 mg via ORAL
  Filled 2013-11-24 (×3): qty 1

## 2013-11-24 MED ORDER — IBUPROFEN 600 MG PO TABS
600.0000 mg | ORAL_TABLET | Freq: Once | ORAL | Status: AC
  Administered 2013-11-24: 600 mg via ORAL
  Filled 2013-11-24: qty 1

## 2013-11-24 NOTE — Progress Notes (Signed)
Internal Medicine Attending  Date: 11/24/2013  Patient name: Mark [name] Medical record number: [id number] Date of birth: 1950/12/06 Age: 63 y.o. Gender: male  I saw and evaluated the patient on A.M rounds, and discussed his care with housestaff.  I reviewed the resident's note by Dr. Eula Fried and I agree with the resident's findings and plans as documented in her note.

## 2013-11-24 NOTE — Progress Notes (Signed)
Medical Student Daily Progress Note  This is a 63 year old man with a PMH of L lung adenocarcinoma s/p partial lobectomy without chemo/radiation in 2009 and history of TB, as well as multiple lacunar strokes with mild vascular dementia, CAD, HTN, previous GI bleed, depression and anxiety, and polysubstance abuse now on methadone, admitted for acute encephalopathy and multilobar CAP.  Subjective: Much more alert today, denies pain or discomfort. Really wants to eat. Says he is not sure what happened yesterday. Multiple BMs after soap suds enema.  Objective: Vital signs in last 24 hours: Filed Vitals:   11/24/13 0041 11/24/13 0350 11/24/13 0441 11/24/13 0823  BP: 153/103  162/99   Pulse: 80  78   Temp: 97.4 F (36.3 C)  97.8 F (36.6 C)   TempSrc: Oral  Oral   Resp: 18  18   Height:      Weight:  68.7 kg (151 lb 7.3 oz)    SpO2: 99%  99% 98%     Weight change: -3.467 kg (-7 lb 10.3 oz)   Intake/Output Summary (Last 24 hours) at 11/24/13 1326 Last data filed at 11/24/13 0527  Gross per 24 hour  Intake      0 ml  Output    750 ml  Net   -750 ml    Physical Exam: GENERAL: sitting up in bed, alert, in NAD. NEURO: Alert and oriented x 3. Cooperative with exam.  HEART: RRR, crisp S1, normal S2, no murmurs, rubs, or gallops.  LUNGS: Diminished air movement in upper lobes bilaterally. Lower lobes with coarse crackles, R>L. On O2 per nasal cannula.  ABDOMEN: NABS. Soft, nontender, and nondistended.  EXTREMITIES: No pretibial edema. L lateral thigh with ~8cmx2cm scar over greater trochanter, nontender to palpation.   Lab Results: Results for orders placed during the hospital encounter of 11/21/13 (from the past 24 hour(s))  GLUCOSE, CAPILLARY     Status: None   Collection Time    11/23/13  8:45 PM      Result Value Range   Glucose-Capillary 81  70 - 99 mg/dL  LACTATE DEHYDROGENASE     Status: Abnormal   Collection Time    11/23/13 11:18 PM      Result Value Range   LDH 381  (*) 94 - 250 U/L  GLUCOSE, CAPILLARY     Status: None   Collection Time    11/24/13 12:39 AM      Result Value Range   Glucose-Capillary 72  70 - 99 mg/dL  GLUCOSE, CAPILLARY     Status: None   Collection Time    11/24/13  4:39 AM      Result Value Range   Glucose-Capillary 81  70 - 99 mg/dL  BASIC METABOLIC PANEL     Status: Abnormal   Collection Time    11/24/13  7:16 AM      Result Value Range   Sodium 141  137 - 147 mEq/L   Potassium 4.2  3.7 - 5.3 mEq/L   Chloride 106  96 - 112 mEq/L   CO2 23  19 - 32 mEq/L   Glucose, Bld 78  70 - 99 mg/dL   BUN 18  6 - 23 mg/dL   Creatinine, Ser 0.99  0.50 - 1.35 mg/dL   Calcium 8.0 (*) 8.4 - 10.5 mg/dL   GFR calc non Af Amer 85 (*) >90 mL/min   GFR calc Af Amer >90  >90 mL/min  CBC WITH DIFFERENTIAL  Status: Abnormal   Collection Time    11/24/13  7:16 AM      Result Value Range   WBC 7.9  4.0 - 10.5 K/uL   RBC 4.38  4.22 - 5.81 MIL/uL   Hemoglobin 8.6 (*) 13.0 - 17.0 g/dL   HCT 29.0 (*) 39.0 - 52.0 %   MCV 66.2 (*) 78.0 - 100.0 fL   MCH 19.6 (*) 26.0 - 34.0 pg   MCHC 29.7 (*) 30.0 - 36.0 g/dL   RDW 18.1 (*) 11.5 - 15.5 %   Platelets 356  150 - 400 K/uL   Neutrophils Relative % 64  43 - 77 %   Lymphocytes Relative 21  12 - 46 %   Monocytes Relative 9  3 - 12 %   Eosinophils Relative 6 (*) 0 - 5 %   Basophils Relative 0  0 - 1 %   Neutro Abs 5.0  1.7 - 7.7 K/uL   Lymphs Abs 1.7  0.7 - 4.0 K/uL   Monocytes Absolute 0.7  0.1 - 1.0 K/uL   Eosinophils Absolute 0.5  0.0 - 0.7 K/uL   Basophils Absolute 0.0  0.0 - 0.1 K/uL   RBC Morphology POLYCHROMASIA PRESENT    GLUCOSE, CAPILLARY     Status: None   Collection Time    11/24/13  7:43 AM      Result Value Range   Glucose-Capillary 82  70 - 99 mg/dL  GLUCOSE, CAPILLARY     Status: None   Collection Time    11/24/13 12:12 PM      Result Value Range   Glucose-Capillary 79  70 - 99 mg/dL    Micro Results: Recent Results (from the past 240 hour(s))  CULTURE, BLOOD (ROUTINE  X 2)     Status: None   Collection Time    11/21/13 11:45 AM      Result Value Range Status   Specimen Description BLOOD   Final   Special Requests     Final   Value: BOTTLES DRAWN AEROBIC AND ANAEROBIC 5C FROM RIGHT EJ   Culture  Setup Time     Final   Value: 11/21/2013 18:05     Performed at [facility]   Culture     Final   Value:        BLOOD CULTURE RECEIVED NO GROWTH TO DATE CULTURE WILL BE HELD FOR 5 DAYS BEFORE ISSUING A FINAL NEGATIVE REPORT     Performed at [facility]   Report Status PENDING   Incomplete  CULTURE, BLOOD (ROUTINE X 2)     Status: None   Collection Time    11/21/13 11:45 AM      Result Value Range Status   Specimen Description BLOOD   Final   Special Requests BOTTLES DRAWN AEROBIC ONLY 5CC FROM RIGHT EJ   Final   Culture  Setup Time     Final   Value: 11/21/2013 18:05     Performed at [facility]   Culture     Final   Value:        BLOOD CULTURE RECEIVED NO GROWTH TO DATE CULTURE WILL BE HELD FOR 5 DAYS BEFORE ISSUING A FINAL NEGATIVE REPORT     Performed at [facility]   Report Status PENDING   Incomplete  RESPIRATORY VIRUS PANEL     Status: None   Collection Time    11/21/13  3:57 PM      Result Value Range  Status   Source - [facility]VPAN NASAL SWAB   Corrected   Comment: CORRECTED ON 01/26 AT 1951: PREVIOUSLY REPORTED AS NASAL SWAB   Respiratory Syncytial Virus A NOT DETECTED   Final   Respiratory Syncytial Virus B NOT DETECTED   Final   Influenza A NOT DETECTED   Final   Influenza B NOT DETECTED   Final   Parainfluenza 1 NOT DETECTED   Final   Parainfluenza 2 NOT DETECTED   Final   Parainfluenza 3 NOT DETECTED   Final   Metapneumovirus NOT DETECTED   Final   Rhinovirus NOT DETECTED   Final   Adenovirus NOT DETECTED   Final   Influenza A H1 NOT DETECTED   Final   Influenza A H3 NOT DETECTED   Final   Comment: (NOTE)           Normal Reference Range for each Analyte: NOT DETECTED     Testing performed using the  Luminex xTAG Respiratory Viral Panel test     kit.     This test was developed and its performance characteristics determined     by [facility]. It has not been cleared or approved by the [place]     Food and Drug Administration. This test is used for clinical purposes.     It should not be regarded as investigational or for research. This     laboratory is certified under the [place] (CLIA) as qualified to perform high complexity     clinical laboratory testing.     Performed at [facility]     Status: None   Collection Time    11/23/13  9:48 AM      Result Value Range Status   Specimen Description PLEURAL FLUID RIGHT   Final   Special Requests 60ML FLUID   Final   Fungal Smear     Final   Value: NO YEAST OR FUNGAL ELEMENTS SEEN     Performed at [facility]   Culture     Final   Value: CULTURE IN PROGRESS FOR FOUR WEEKS     Performed at [facility]   Report Status PENDING   Incomplete  BODY FLUID CULTURE     Status: None   Collection Time    11/23/13  9:48 AM      Result Value Range Status   Specimen Description PLEURAL FLUID RIGHT   Final   Special Requests 60 ML FLUID   Final   Gram Stain     Final   Value: MODERATE WBC PRESENT,BOTH PMN AND MONONUCLEAR     NO ORGANISMS SEEN     Performed at [facility]     Final   Value: NO GROWTH 1 DAY     Performed at [facility]   Report Status PENDING   Incomplete    Studies/Results: Dg Chest 1 View  11/23/2013   CLINICAL DATA:  Status post right thoracentesis  EXAM: CHEST - 1 VIEW  COMPARISON:  CT chest dated 11/21/2013  FINDINGS: No definite pneumothorax is seen status post right thoracentesis.  Mild patchy right lower lobe opacity, suspicious for pneumonia.  Small loculated/layering left pleural effusion.  Cardiomegaly.  Residual contrast in the mid esophagus from recent fluoroscopic study, reflecting  esophageal dysmotility and/or reflux.  IMPRESSION: No definite pneumothorax is seen status post right thoracentesis.  Small loculated/layering left  pleural effusion.  Mild patchy right lower lobe opacity, suspicious for pneumonia.   Electronically Signed   By: Julian Hy M.D.   On: 11/23/2013 10:46   Dg Esophagus  11/23/2013   CLINICAL DATA:  Difficulty swallowing. Personal history of lung cancer. Generalized weakness. Dysphagia.  EXAM: ESOPHOGRAM/BARIUM SWALLOW  TECHNIQUE: Single contrast examination was performed using  thin barium.  COMPARISON:  Chest CT 11/21/2013.  FLUOROSCOPY TIME:  3 min 15 seconds  FINDINGS: Study is technically suboptimal. The patient was unable to cooperate with swallowing to adequately distend the esophagus. Only about 4 oz of contrast was ingested throughout the study despite instructions. There was profound esophageal stasis. No gross fixed strictures were identified. Tertiary contractions were observed. Esophageal diverticulum was present in the distal esophagus, just proximal to the gastroesophageal junction. 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 Because of patient debilitation, we could not repositioned patient to optimally visualize this distal esophageal diverticulum. No hiatal hernia was identified. There was not adequate distension to assess for reflux. The epiphrenic diverticulum is best visualized on series 20.  IMPRESSION: Technically suboptimal study due to patient debilitation, inability to cooperate with swallowing and small amount of ingested contrast. No focal strictures or esophageal mass lesions identified. Small epiphrenic diverticulum. Marked esophageal stasis. Tertiary contractions associated with nonspecific esophageal dysmotility disorder.   Electronically Signed   By: Dereck Ligas M.D.   On: 11/23/2013 09:43   US Thoracentesis Asp Pleural Space W/img Guide  11/23/2013   CLINICAL DATA:  Pleural effusion, shortness of breath,  request for diagnostic and therapeutic thoracentesis  EXAM: ULTRASOUND GUIDED Right THORACENTESIS  COMPARISON:  None.  FINDINGS: A total of approximately 1.2 liters of serous fluid was removed. A fluid sample was sent for laboratory analysis.  IMPRESSION: Successful ultrasound guided right thoracentesis yielding 1.2 liters of pleural fluid.  Read By:  Tsosie Billing PA-C  PROCEDURE: An ultrasound guided thoracentesis was thoroughly discussed with the patient and questions answered. The benefits, risks, alternatives and complications were also discussed. The patient understands and wishes to proceed with the procedure. Written consent was obtained.  Ultrasound was performed to localize and mark an adequate pocket of fluid in the right chest. The area was then prepped and draped in the normal sterile fashion. 1% Lidocaine was used for local anesthesia. Under ultrasound guidance a 19 gauge Yueh catheter was introduced. Thoracentesis was performed. The catheter was removed and a dressing applied.  Complications:  none.   Electronically Signed   By: Arne Cleveland M.D.   On: 11/23/2013 10:05    Medications:  Scheduled Meds: . antiseptic oral rinse  15 mL Mouth Rinse q12n4p  . aspirin EC  81 mg Oral Daily  . chlorhexidine  15 mL Mouth Rinse BID  . folic acid  1 mg Oral Daily  . heparin  5,000 Units Subcutaneous Q8H  . hydrALAZINE  10 mg Oral TID  . ipratropium-albuterol  3 mL Nebulization TID  . lisinopril  10 mg Oral Daily  . multivitamin with minerals  1 tablet Oral Daily  . piperacillin-tazobactam (ZOSYN)  IV  3.375 g Intravenous Q8H  . sodium chloride  3 mL Intravenous Q12H   Continuous Infusions:  PRN Meds:.  Assessment/Plan:  Acute Encephalopathy. Most likely delirium 2/2 recent increase of methadone dose, now superimposed on concomitant illness and with levofloxacin which can be psychoactive in older or medically frail individuals. Patient with increased somnolence following admin of lower  dose of methadone and Ativan for CIWA, now  much more awake today. Ammonia normal. CVA, subdural bleed, NPH considered but unlikely.  - methadone discontinued. If patient exhibits withdrawal symptoms (restlessness, rhinorrhea/lacrimation, myalgias, arthralgias, nausea/vomiting, abd cramping, diarrhea, elev HR/BP/RR), could consider geriatric dose of methadone (2.5 mg q8 to 12 hours) with understanding that patient's metabolism of methadone is likely slowed due to his poor liver function. - clonidine patch discontinued as clonidine can also be psychoactive. Pt is back on home meds for HTN. - re-orientation as needed  - minimize psychoactive meds  - PT/OT following, CIR to evaluate. - fall precautions   Cough/SOB: Changed antibiotic therapy to Zosyn to cover for anaerobes given aspiration risk. WBC remains normal and patient is afebrile. Pleural fluid chemistry suggests transudative effusion; waiting for cultures and cytology. Pulmonary consulted and believes strongly that this is aspiration pneumonia perhaps with superimposed pulm edema 2/2 CHF, recommended completion of antibiotic therapy and consideration of imaging follow up in one year. No role for bronchoscopy. - HIV ELISA nonreactive  - urine S. pneumo and legionella antigens neg.  - Zosyn 3.35g IV q8  - appreciate pulmonology recommendations. - respiratory virus panel negative - Duonebs Q6h   Dysphagia/Odynophagia: CT showed residual fluid in esophagus; barium swallow suggestive of dysmotility or with epiphrenic diverticulum. GI consulted, recommend against endoscopy, recommended no further workup. - SLP following, recommended dysphagia 1 diet (puree, with crushed meds)  Shaking episodes: Possible the patient has been having complex partial vs simple partial seizures given the family's reports; however, these episodes have been much less frequent lately thus are not likely related to his functional decline. No episodes reported during current  hospitalization. - EEG pending. - consider outpt neuro f/u  CHF/CAD/HTN: currently stable. Patient does not appear volume overloaded. Pulm recommended diuresis but patient appears clinically dry; will hold off for now given adequate oxygenation, no LE edema. - aspirin 81 mg daily  - hydralazine 10 mg TID  - lisinopril 10 mg daily  - telemetry   Urinary hesitancy: patient urinating fine here, with low postvoid residuals on bladder scan.  - would consider adding tamsulosin 0.4 mg daily if patient becomes symptomatic - consider outpt urology f/u if patient becomes symptomatic  Polysubstance use including alcohol, methadone: patient with elevated BP this a.m. But no anxiety, tremor, diaphoresis, agitation this morning.  - holding methadone and lorazepam 2/2 risk of AMS - CIWA protocol   Anemia: Hgb stable at 8.2 today. MCV 67 with polychromasia on smear, strongly suggestive of iron deficiency anemia.  - patient now on MVI  Transaminitis with small volume ascites on chest CT in setting of HCV+ patient. ALP, AST, ALT improved today but not yet wnl. 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  - daily CMET  - GI recommended abd CT vs liver U/s to rule out liver metastases; will discuss with team.   L Hip pain: patient denies L hip pain today while lying in bed. - avoiding narcotics 2/2 hx abuse, on methadone, and concern for AMS  - avoiding ibuprofen 2/2 HTN and tylenol 2/2 transaminitis.   FEN/GI:  - SLP following  - Dysphagia 1 diet - IV fluids discontinued - soap suds enemas PRN constipation   PPx:  - SQH  - ambulation with assistance when mental status improves  - aspiration precautions     LOS: 3 days   This is a  Medical Student Note.  The care of the patient was discussed with Dr. Eula Fried and the assessment and plan  formulated with their assistance.  Please see their attached note for official documentation of the daily encounter.  Mark [name] 11/24/2013, 1:26 PM

## 2013-11-24 NOTE — Progress Notes (Signed)
Physical Therapy Treatment Patient Details Name: Mark [name] MRN: [id number] DOB: [birth year] Today's Date: 11/24/2013 Time: 3154-0086 PT Time Calculation (min): 28 min  PT Assessment / Plan / Recommendation  History of Present Illness HPI: This is a 63 year old man with a PMH of L lung adenocarcinoma s/p partial lobectomy without chemo/radiation in 2009 and history of TB, as well as multiple lacunar strokes with mild vascular dementia, CAD, HTN, previous GI bleed, depression and anxiety, and polysubstance abuse now on methadone, who presents with his daughter and son-in-law complaining of progressively worsening generalized weakness, sleep difficulty, and waxing and waning confusion and gait instability for the past two weeks. The history is per the patient and his family members. These symptoms have been associated with a cough productive of green and reddish sputum and mild shortness of breath at night over the same time period.     PT Comments   Pt improved cognition and more alert today.  Still limited with mobility however improving.  O2 on RA after 30' amb 96%.  Follow Up Recommendations  CIR;SNF     Does the patient have the potential to tolerate intense rehabilitation     Barriers to Discharge        Equipment Recommendations  3in1 (PT)    Recommendations for Other Services Rehab consult;OT consult  Frequency Min 3X/week   Progress towards PT Goals Progress towards PT goals: Progressing toward goals  Plan Current plan remains appropriate    Precautions / Restrictions Precautions Precautions: Fall Restrictions Weight Bearing Restrictions: No   Pertinent Vitals/Pain Pt denied pain    Mobility  Bed Mobility Overal bed mobility: Needs Assistance Bed Mobility: Supine to Sit Supine to sit: Min assist;HOB elevated General bed mobility comments: assist needed to initiate mobility Transfers Overall transfer level: Needs assistance Equipment used: Rolling walker (2  wheeled) Transfers: Sit to/from Stand Sit to Stand: Min assist General transfer comment: cues for safety; uncontrolled descent to sit Ambulation/Gait Ambulation/Gait assistance: Min guard Ambulation Distance (Feet): 30 Feet Assistive device: Rolling walker (2 wheeled) Gait Pattern/deviations: Step-to pattern;Decreased stride length Gait velocity: decreased Gait velocity interpretation: Below normal speed for age/gender General Gait Details: improved control of RW with ambulation; no significant safety concerns with amb    Exercises     PT Diagnosis:    PT Problem List:   PT Treatment Interventions:     PT Goals (current goals can now be found in the care plan section) Acute Rehab PT Goals Patient Stated Goal: go home PT Goal Formulation: With patient Time For Goal Achievement: 12/06/13 Potential to Achieve Goals: Good  Visit Information  Last PT Received On: 11/24/13 Assistance Needed: +1 History of Present Illness: HPI: This is a 63 year old man with a PMH of L lung adenocarcinoma s/p partial lobectomy without chemo/radiation in 2009 and history of TB, as well as multiple lacunar strokes with mild vascular dementia, CAD, HTN, previous GI bleed, depression and anxiety, and polysubstance abuse now on methadone, who presents with his daughter and son-in-law complaining of progressively worsening generalized weakness, sleep difficulty, and waxing and waning confusion and gait instability for the past two weeks. The history is per the patient and his family members. These symptoms have been associated with a cough productive of green and reddish sputum and mild shortness of breath at night over the same time period.      Subjective Data  Subjective: "I'm pretty tired, can I sit down?" Patient Stated Goal: go home   Cognition  Cognition Arousal/Alertness: Awake/alert Behavior During Therapy: WFL for tasks assessed/performed Overall Cognitive Status: Impaired/Different from  baseline Area of Impairment: Following commands;Safety/judgement;Awareness;Problem solving Following Commands: Follows one step commands with increased time Safety/Judgement: Decreased awareness of deficits Awareness: Intellectual Problem Solving: Slow processing;Decreased initiation;Requires verbal cues;Requires tactile cues    Balance  Balance Overall balance assessment: Needs assistance Sitting-balance support: Bilateral upper extremity supported;Feet supported Sitting balance-Leahy Scale: Good Standing balance support: During functional activity;Bilateral upper extremity supported Standing balance-Leahy Scale: Fair  End of Session PT - End of Session Equipment Utilized During Treatment: Gait belt (O2 on RA after amb 96%) Activity Tolerance: Patient tolerated treatment well;Patient limited by fatigue Patient left: in chair;with call bell/phone within reach Nurse Communication: Mobility status   GP     [name] K 11/24/2013, 9:53 AM  Laureen Abrahams, PT, DPT [phone]

## 2013-11-24 NOTE — Clinical Social Work Psychosocial (Signed)
Clinical Social Work Department BRIEF PSYCHOSOCIAL ASSESSMENT 11/24/2013  Patient:  Mark [name], Mark [name]     Account Number:  [id number]     Admit date:  11/21/2013  Clinical Social Worker:  Lovey Newcomer  Date/Time:  11/24/2013 12:32 PM  Referred by:  Physician  Date Referred:  11/24/2013 Referred for  SNF Placement   Other Referral:   Interview type:  Family Other interview type:   Patient not oriented at time of assessment. CSW spoke with daughter.    PSYCHOSOCIAL DATA Living Status:  FAMILY Admitted from facility:   Level of care:   Primary support name:  Cathlyn Parsons Primary support relationship to patient:  CHILD, ADULT Degree of support available:   Support is adequate.    CURRENT CONCERNS Current Concerns  Post-Acute Placement   Other Concerns:    SOCIAL WORK ASSESSMENT / PLAN CSW spoke with patient's daughter over phone. Daughter agrees that patient needs to go to SNF for rehab. She states that the patient has had past SNF stay at [facility] but has always been opposed to going to SNF. Daughter asks that MD and PT speak with patient about his need for rehab. CSW explained to daughter that since patient has Medicaid only, that the SNF options will be limited. Daughter understands and waits for bed offers. Daughter states patient is from home with her and the plan is for him to return to her apartment after rehab.   Assessment/plan status:  Psychosocial Support/Ongoing Assessment of Needs Other assessment/ plan:   Complete FL2, Fax   Information/referral to community resources:   CSW contact information and SNF list given to daughter.    PATIENT'S/FAMILY'S RESPONSE TO PLAN OF CARE: Patient's daughter agrees that patient needs to go to SNF dsicharge for rehab. Daughter was pleasant, appropriate, and appreciative of CSW contact. CSW will follow up with available beds.       Liz Beach, [place], [place], [phone]

## 2013-11-24 NOTE — Progress Notes (Signed)
RN stated to MD-resident, that patient's b/p was high. MD stated that it was ok compared to his other ones all day. Will continue to monitor

## 2013-11-24 NOTE — Consult Note (Signed)
Physical Medicine and Rehabilitation Consult Reason for Consult: Acute encephalopathy/CAP Referring Physician: Teaching service   HPI: Mark [name] is a 63 y.o. 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 Patient independent with a cane living with daughter prior to admission. Admitted 11/21/2013 with generalized weaknes as altered mental status x2 weeks. These symptoms were associated with productive cough mild shortness of breath. Cranial CT scan negative for acute changes. Chest x-ray showed patchy right sided interstitial air space disease suspicious for infection. Patient placed on broad-spectrum antibiotics. Blood cultures x2 negative. CT of the chest showed hazy bilateral peribronchial opacities with bilateral effusions and some loculation as well as 4 mm nodule left lower lobe nonspecific lymphadenopathy which was similar to a prior study. No plan for bronchoscopy. Patient underwent right thoracentesis with 1.2 L of serous fluid yielded 11/23/2013. Question dysphagia esophagram 11/23/2013 showed marked dysmotility small epiphrenic diverticulum no stricture noted and reviewed by gastroenterology. Currently maintained on a dysphagia 1 thin liquid diet. Subcutaneous heparin for DVT prophylaxis. Bouts of confusion but continues to improve. Patient's methadone is currently on. EEG is pending Physical therapy evaluation completed an ongoing with recommendations for physical medicine rehabilitation consult to consider inpatient rehabilitation services.   Review of Systems  Respiratory: Positive for cough and shortness of breath.   Musculoskeletal: Positive for joint pain, myalgias and neck pain.  Psychiatric/Behavioral: Positive for depression and  memory loss.  All other systems reviewed and are negative.   Past Medical History  Diagnosis Date  . Essential hypertension, benign 02/26/2013  . Liver lesion     a. Previously biopsy-proven to be a cyst (per records from a hospital in [place]).  . Hypoxia     a. Adm 02/2013, required home O2.  . Drug abuse   . Depression   . Seizure disorder     "after hip OR 02/2012 once I got home" (04/01/2013)  . GI bleed     a. 2009 at time of stroke - received 3 u blood, no source found per pt  . HTN (hypertension)   . On home oxygen therapy     2.5L "usually use it at night" (04/01/2013)  . Tuberculosis   . Positive TB test   . History of blood transfusion 2009    "don't know what it was related to" (04/01/2013)  . Stroke 2009    a. Pt reports several, last in 2009. b. Residual L sided weakness.  . Anxiety   . Adenocarcinoma, lung     a. Dx 2013, underwent left lower lobectomy under the care of Dr.Lapunzina in [place] for a stage I non-small cell lung CA.; "went to [facility] ~ 06/2012 and they couldn't find anything" (04/01/2013)  . Pneumonia   . Coronary atherosclerosis of native coronary artery    Past Surgical History  Procedure Laterality Date  . Hip arthroplasty Left 02/2012  . Lung biopsy Left 07/15/2012    "LLL" (04/01/2013)  . Lung removal, partial Left     /notes 02/26/2013  (04/01/2013)   Family History  Problem Relation Age of Onset  . Stroke    . CAD Neg Hx    Social History:  reports that he quit smoking about 20 months ago. His smoking use included Cigarettes. He has a 21  pack-year smoking history. He has never used smokeless tobacco. He reports that he drinks about 29.4 ounces of alcohol per week. He reports that he does not use illicit drugs. Allergies: No Known Allergies Medications Prior to Admission  Medication Sig Dispense Refill  . aspirin EC 81 MG EC tablet Take 1 tablet (81 mg total) by mouth daily.      . folic acid (FOLVITE) 1 MG tablet Take 1 tablet (1 mg total) by  mouth daily.      . hydrALAZINE (APRESOLINE) 10 MG tablet Take 1 tablet (10 mg total) by mouth 3 (three) times daily.  90 tablet  1  . levofloxacin (LEVAQUIN) 750 MG tablet Take 1 tablet (750 mg total) by mouth daily. X 5 days  7 tablet  0  . lisinopril (PRINIVIL,ZESTRIL) 10 MG tablet Take 10 mg by mouth daily.      Marland Kitchen METHADONE HCL PO Take 63 mg by mouth daily. As directed      . polyethylene glycol powder (GLYCOLAX/MIRALAX) powder Take 17 g by mouth 2 (two) times daily. Until daily soft stools  OTC  255 g  0  . potassium chloride (K-DUR) 10 MEQ tablet Take 1 tablet (10 mEq total) by mouth once.  30 tablet  0    Home: Home Living Family/patient expects to be discharged to:: Private residence Living Arrangements: Other (Comment) (daughter; son in law) Available Help at Discharge: Family;Other (Comment) (daughter told OT that someone is with him 24/7;per PT ) Type of Home: Apartment Home Access: Stairs to enter Entrance Stairs-Number of Steps: 10 steps Entrance Stairs-Rails: Right;Left;Can reach both Home Layout: One level Home Equipment: Cane - quad (uses oxygen) Additional Comments: At times pt is difficult to understand; he did not mention a spouse to this therapist on PT eval; He indicated he has given care to his mother until her death, which was within the past few weeks according to pt; Will need clarification on all aspects of his home environment, including available assist  Functional History:   Functional Status:  Mobility:     Ambulation/Gait Ambulation Distance (Feet): 30 Feet Gait velocity: decreased General Gait Details: improved control of RW with ambulation; no significant safety concerns with amb    ADL: ADL Grooming: Wash/dry hands;Wash/dry face;Minimal assistance;Moderate assistance;Set up;Supervision/safety (supported sitting-setup/supervision for washing face/ ) Where Assessed - Grooming: Supported sitting;Unsupported sitting Upper Body Dressing: Maximal  assistance Where Assessed - Upper Body Dressing: Supported sitting Lower Body Dressing: +1 Total assistance Where Assessed - Lower Body Dressing: Supported sit to Lobbyist: Moderate assistance Toilet Transfer Method: Sit to stand Toilet Transfer Equipment: Other (comment) (from chair and bed) Tub/Shower Transfer Method: Not assessed Equipment Used: Gait belt;Rolling walker;Other (comment) (oxygen) Transfers/Ambulation Related to ADLs: +2 Total A for safety with ambulation; Mod A for transfers. ADL Comments: Pt requiring Min/Mod A for balance when sitting EOB for balance-feet supported-washed face. Pt with slow processing during session requiring extra time. Washed hands at sink and washed face/mouth sitting EOB and in chair.  Cognition: Cognition Overall Cognitive Status: Impaired/Different from baseline Orientation Level: Oriented to person;Oriented to place;Disoriented to time;Disoriented to situation Cognition Arousal/Alertness: Awake/alert Behavior During Therapy: WFL for tasks assessed/performed Overall Cognitive Status: Impaired/Different from baseline Area of Impairment: Following commands;Safety/judgement;Awareness;Problem solving Orientation Level: Disoriented to;Time Following Commands: Follows one step commands with increased time Safety/Judgement: Decreased awareness of deficits Awareness: Intellectual Problem Solving: Slow processing;Decreased initiation;Requires verbal cues;Requires tactile cues  Blood pressure 162/99, pulse 78, temperature 97.8 F (36.6 C), temperature  source Oral, resp. rate 18, height 5\' 11"  (1.803 m), weight 68.7 kg (151 lb 7.3 oz), SpO2 98.00%. Physical Exam  Vitals reviewed. Constitutional: No distress.  Emaciated.   HENT:  Head: Normocephalic.  Eyes: EOM are normal.  Neck: Normal range of motion. Neck supple. No thyromegaly present.  Cardiovascular: Normal rate and regular rhythm.   Respiratory:  Decreased breath sounds at the  bases but clear to auscultation  GI: Soft. Bowel sounds are normal. He exhibits no distension.  Neurological: He is alert.  Patient was able to state his name and age and place. He was a poor medical historian. He followed basic commands. Moved all 4's. LE 3- HF, KE and 4-/5 at ankles. UE's 4- to 4/5. Sensory exam slightly decreased at feet.   Skin: Skin is warm. He is diaphoretic.  Psychiatric:  Flat     Results for orders placed during the hospital encounter of 11/21/13 (from the past 24 hour(s))  GLUCOSE, CAPILLARY     Status: None   Collection Time    11/23/13  8:45 PM      Result Value Range   Glucose-Capillary 81  70 - 99 mg/dL  LACTATE DEHYDROGENASE     Status: Abnormal   Collection Time    11/23/13 11:18 PM      Result Value Range   LDH 381 (*) 94 - 250 U/L  GLUCOSE, CAPILLARY     Status: None   Collection Time    11/24/13 12:39 AM      Result Value Range   Glucose-Capillary 72  70 - 99 mg/dL  GLUCOSE, CAPILLARY     Status: None   Collection Time    11/24/13  4:39 AM      Result Value Range   Glucose-Capillary 81  70 - 99 mg/dL  BASIC METABOLIC PANEL     Status: Abnormal   Collection Time    11/24/13  7:16 AM      Result Value Range   Sodium 141  137 - 147 mEq/L   Potassium 4.2  3.7 - 5.3 mEq/L   Chloride 106  96 - 112 mEq/L   CO2 23  19 - 32 mEq/L   Glucose, Bld 78  70 - 99 mg/dL   BUN 18  6 - 23 mg/dL   Creatinine, Ser 0.99  0.50 - 1.35 mg/dL   Calcium 8.0 (*) 8.4 - 10.5 mg/dL   GFR calc non Af Amer 85 (*) >90 mL/min   GFR calc Af Amer >90  >90 mL/min  CBC WITH DIFFERENTIAL     Status: Abnormal   Collection Time    11/24/13  7:16 AM      Result Value Range   WBC 7.9  4.0 - 10.5 K/uL   RBC 4.38  4.22 - 5.81 MIL/uL   Hemoglobin 8.6 (*) 13.0 - 17.0 g/dL   HCT 29.0 (*) 39.0 - 52.0 %   MCV 66.2 (*) 78.0 - 100.0 fL   MCH 19.6 (*) 26.0 - 34.0 pg   MCHC 29.7 (*) 30.0 - 36.0 g/dL   RDW 18.1 (*) 11.5 - 15.5 %   Platelets 356  150 - 400 K/uL   Neutrophils  Relative % 64  43 - 77 %   Lymphocytes Relative 21  12 - 46 %   Monocytes Relative 9  3 - 12 %   Eosinophils Relative 6 (*) 0 - 5 %   Basophils Relative 0  0 - 1 %   Neutro Abs  5.0  1.7 - 7.7 K/uL   Lymphs Abs 1.7  0.7 - 4.0 K/uL   Monocytes Absolute 0.7  0.1 - 1.0 K/uL   Eosinophils Absolute 0.5  0.0 - 0.7 K/uL   Basophils Absolute 0.0  0.0 - 0.1 K/uL   RBC Morphology POLYCHROMASIA PRESENT    GLUCOSE, CAPILLARY     Status: None   Collection Time    11/24/13  7:43 AM      Result Value Range   Glucose-Capillary 82  70 - 99 mg/dL  GLUCOSE, CAPILLARY     Status: None   Collection Time    11/24/13 12:12 PM      Result Value Range   Glucose-Capillary 79  70 - 99 mg/dL   Dg Chest 1 View  11/23/2013   CLINICAL DATA:  Status post right thoracentesis  EXAM: CHEST - 1 VIEW  COMPARISON:  CT chest dated 11/21/2013  FINDINGS: No definite pneumothorax is seen status post right thoracentesis.  Mild patchy right lower lobe opacity, suspicious for pneumonia.  Small loculated/layering left pleural effusion.  Cardiomegaly.  Residual contrast in the mid esophagus from recent fluoroscopic study, reflecting esophageal dysmotility and/or reflux.  IMPRESSION: No definite pneumothorax is seen status post right thoracentesis.  Small loculated/layering left pleural effusion.  Mild patchy right lower lobe opacity, suspicious for pneumonia.   Electronically Signed   By: Julian Hy M.D.   On: 11/23/2013 10:46   Dg Esophagus  11/23/2013   CLINICAL DATA:  Difficulty swallowing. Personal history of lung cancer. Generalized weakness. Dysphagia.  EXAM: ESOPHOGRAM/BARIUM SWALLOW  TECHNIQUE: Single contrast examination was performed using  thin barium.  COMPARISON:  Chest CT 11/21/2013.  FLUOROSCOPY TIME:  3 min 15 seconds  FINDINGS: Study is technically suboptimal. The patient was unable to cooperate with swallowing to adequately distend the esophagus. Only about 4 oz of contrast was ingested throughout the study  despite instructions. There was profound esophageal stasis. No gross fixed strictures were identified. Tertiary contractions were observed. Esophageal diverticulum was present in the distal esophagus, just proximal to the gastroesophageal junction. 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 Because of patient debilitation, we could not repositioned patient to optimally visualize this distal esophageal diverticulum. No hiatal hernia was identified. There was not adequate distension to assess for reflux. The epiphrenic diverticulum is best visualized on series 20.  IMPRESSION: Technically suboptimal study due to patient debilitation, inability to cooperate with swallowing and small amount of ingested contrast. No focal strictures or esophageal mass lesions identified. Small epiphrenic diverticulum. Marked esophageal stasis. Tertiary contractions associated with nonspecific esophageal dysmotility disorder.   Electronically Signed   By: Dereck Ligas M.D.   On: 11/23/2013 09:43   US Thoracentesis Asp Pleural Space W/img Guide  11/23/2013   CLINICAL DATA:  Pleural effusion, shortness of breath, request for diagnostic and therapeutic thoracentesis  EXAM: ULTRASOUND GUIDED Right THORACENTESIS  COMPARISON:  None.  FINDINGS: A total of approximately 1.2 liters of serous fluid was removed. A fluid sample was sent for laboratory analysis.  IMPRESSION: Successful ultrasound guided right thoracentesis yielding 1.2 liters of pleural fluid.  Read By:  Tsosie Billing PA-C  PROCEDURE: An ultrasound guided thoracentesis was thoroughly discussed with the patient and questions answered. The benefits, risks, alternatives and complications were also discussed. The patient understands and wishes to proceed with the procedure. Written consent was obtained.  Ultrasound was performed to localize and mark an adequate pocket of fluid in the right chest. The area was then prepped  and draped in the normal sterile fashion.  1% Lidocaine was used for local anesthesia. Under ultrasound guidance a 19 gauge Yueh catheter was introduced. Thoracentesis was performed. The catheter was removed and a dressing applied.  Complications:  none.   Electronically Signed   By: Arne Cleveland M.D.   On: 11/23/2013 10:05    Assessment/Plan: Diagnosis: deconditioning after multiple medical issues 1. Does the need for close, 24 hr/day medical supervision in concert with the patient's rehab needs make it unreasonable for this patient to be served in a less intensive setting? Yes 2. Co-Morbidities requiring supervision/potential complications: encephalopathy, anemia, htn, cap 3. Due to bladder management, bowel management, safety, skin/wound care, disease management, medication administration, pain management and patient education, does the patient require 24 hr/day rehab nursing? Yes 4. Does the patient require coordinated care of a physician, rehab nurse, PT (1-2 hrs/day, 5 days/week) and OT (1-2 hrs/day, 5 days/week) to address physical and functional deficits in the context of the above medical diagnosis(es)? Yes Addressing deficits in the following areas: balance, endurance, locomotion, strength, transferring, bowel/bladder control, bathing, dressing, feeding, grooming, toileting and psychosocial support 5. Can the patient actively participate in an intensive therapy program of at least 3 hrs of therapy per day at least 5 days per week? Yes 6. The potential for patient to make measurable gains while on inpatient rehab is excellent 7. Anticipated functional outcomes upon discharge from inpatient rehab are mod I with PT, mod I with OT, n/a with SLP. 8. Estimated rehab length of stay to reach the above functional goals is: 10 days 9. Does the patient have adequate social supports to accommodate these discharge functional goals? Yes, potentially 10. Anticipated D/C setting: Home 11. Anticipated post D/C treatments: [place] therapy 12. Overall  Rehab/Functional Prognosis: excellent  RECOMMENDATIONS: This patient's condition is appropriate for continued rehabilitative care in the following setting: CIR Patient has agreed to participate in recommended program. Yes Note that insurance prior authorization may be required for reimbursement for recommended care.  Comment: Rehab Admissions Coordinator to follow up.  Thanks,  Meredith Staggers, MD, Mellody Drown     11/24/2013

## 2013-11-24 NOTE — Progress Notes (Signed)
EEG Completed; Results Pending  

## 2013-11-24 NOTE — Progress Notes (Signed)
Speech Language Pathology Treatment: Dysphagia  Patient Details Name: Mark [name] MRN: [id number] DOB: 17-Apr-1951 Today's Date: 11/24/2013 Time: 4268-3419 SLP Time Calculation (min): 29 min  Assessment / Plan / Recommendation Clinical Impression  Pt fully alert this am, ready to eat. Given finding on esophagram of profound stasis with dysmotility, attempted strategies to facilitate esophageal transit of thin and soft textures. Pt tolerated small cup sips of warm water, given slowly with rest breaks. Larger sips via straw resulted in coughing. Also provided small bites of puree followed with sips of warm water. Pt was able to consume about 5 bites and sips then with a  Cough. Provided a rest break and then resumed with good tolerance. Pt is recommended to initiate a puree (dys 1) diet with thin liquids - warm or hot only. He should eat out of bed, in the chair, fully upright. Follow bites with sips and take rest breaks. He may need 5-7 small meals rather than three large meals. Pt will need f/u for further education. Discussed with RN and posted signs. MD reports pt will go to CIR which will be helpful to reinforce strategies for diet tolerance.    HPI HPI: This is a 63 year old man with a PMH of L lung adenocarcinoma s/p partial lobectomy without chemo/radiation in 2009 and history of TB, as well as multiple lacunar strokes with mild vascular dementia, CAD, HTN, previous GI bleed, depression and anxiety, and polysubstance abuse now on methadone, who presents with his daughter and son-in-law complaining of progressively worsening generalized weakness, sleep difficulty, and waxing and waning confusion and gait instability for the past two weeks. The history is per the patient and his family members. These symptoms have been associated with a cough productive of green and reddish sputum and mild shortness of breath at night over the same time period.    Pertinent Vitals NA  SLP Plan  Continue with current  plan of care    Recommendations Diet recommendations: Dysphagia 1 (puree);Thin liquid Liquids provided via: Cup;No straw Medication Administration: Crushed with puree Supervision: Staff to assist with self feeding Compensations: Slow rate;Small sips/bites;Follow solids with liquid Postural Changes and/or Swallow Maneuvers: Seated upright 90 degrees;Upright 30-60 min after meal;Out of bed for meals              General recommendations: Rehab consult Oral Care Recommendations: Oral care BID Follow up Recommendations: Inpatient Rehab Plan: Continue with current plan of care    GO    [facility], [place] CCC-SLP [phone]  [name] 11/24/2013, 9:25 AM

## 2013-11-24 NOTE — Procedures (Signed)
History: 63 year old male with a history of  Technique: This is a 19 channel routine scalp EEG performed at the bedside with bipolar and monopolar montages arranged in accordance to the international 10/20 system of electrode placement. One channel was dedicated to EKG recording.    Background: The background consists, and alpha activity, though there is some irregular generalized delta as well. The posterior rhythm and she's a maximal frequency of 8 Hz. Sleep structures are not observed  Photic stimulation: Physiologic driving is not performed  EEG Abnormalities: 1) generalized irregular delta  Clinical Interpretation: This  EEG is consistent with a mild generalized non-specific cerebral dysfunction(encephalopathy). There was no seizure or seizure predisposition recorded on this study.   Roland Rack, MD [facility] [phone]  If 7pm- 7am, please page neurology on call at [phone].

## 2013-11-24 NOTE — Progress Notes (Signed)
Subjective: Mark [name] was seen and examined at bedside. He is much more alert and arousable this morning. He is going to work with SLP for possible advancement of his diet. He did have BM's after his enema.  Objective: Vital signs in last 24 hours: Filed Vitals:   11/24/13 0041 11/24/13 0350 11/24/13 0441 11/24/13 0823  BP: 153/103  162/99   Pulse: 80  78   Temp: 97.4 F (36.3 C)  97.8 F (36.6 C)   TempSrc: Oral  Oral   Resp: 18  18   Height:      Weight:  151 lb 7.3 oz (68.7 kg)    SpO2: 99%  99% 98%   Weight change: -7 lb 10.3 oz (-3.467 kg)  Intake/Output Summary (Last 24 hours) at 11/24/13 0900 Last data filed at 11/24/13 0527  Gross per 24 hour  Intake      0 ml  Output    750 ml  Net   -750 ml   Vitals reviewed. General: NAD HEENT: PERRL, EOMI Cardiac: RRR Pulm:mild rales mid lungs b/l Abd: soft, nontender, nondistended, BS present Ext: warm and well perfused, no pedal edema, darkened petechia on plantar surface of b/l feet, moving all 4 extremities Neuro: alert awake and oriented x3, strength equal in all extremities, sensation grossly intact  Lab Results: Basic Metabolic Panel:  Recent Labs Lab 11/22/13 1000 11/23/13 0505 11/24/13 0716  NA  --  141 141  K  --  4.5 4.2  CL  --  104 106  CO2  --  21 23  GLUCOSE  --  79 78  BUN  --  14 18  CREATININE  --  0.88 0.99  CALCIUM  --  8.1* 8.0*  MG 1.9  --   --    Liver Function Tests:  Recent Labs Lab 11/22/13 0112 11/23/13 0505  AST 211* 171*  ALT 112* 94*  ALKPHOS 146* 139*  BILITOT 0.7 1.2  PROT 7.7 7.5  7.5  ALBUMIN 2.5* 2.4*    Recent Labs Lab 11/19/13 0139  AMMONIA 35   CBC:  Recent Labs Lab 11/21/13 0942 11/22/13 0112 11/24/13 0716  WBC 7.9 7.7 7.9  NEUTROABS 5.9  --  PENDING  HGB 8.5* 8.2* 8.6*  HCT 28.4* 27.5* 29.0*  MCV 67.0* 66.4* 66.2*  PLT 319 336 356   Cardiac Enzymes:  Recent Labs Lab 11/21/13 1145 11/22/13 0112  TROPONINI <0.30 <0.30   BNP:  Recent  Labs Lab 11/18/13 2303  PROBNP 1975.0*   CBG:  Recent Labs Lab 11/23/13 0416 11/23/13 1207 11/23/13 2045 11/24/13 0039 11/24/13 0439 11/24/13 0743  GLUCAP 84 92 81 72 81 82   Urine Drug Screen: Drugs of Abuse     Component Value Date/Time   LABOPIA NONE DETECTED 11/22/2013 0702   COCAINSCRNUR NONE DETECTED 11/22/2013 0702   LABBENZ NONE DETECTED 11/22/2013 0702   AMPHETMU NONE DETECTED 11/22/2013 0702   THCU NONE DETECTED 11/22/2013 0702   LABBARB NONE DETECTED 11/22/2013 0702    Alcohol Level:  Recent Labs Lab 11/19/13 0139  ETH <11   Urinalysis:  Recent Labs Lab 11/21/13 1236  COLORURINE AMBER*  LABSPEC 1.029  PHURINE 5.5  GLUCOSEU NEGATIVE  HGBUR NEGATIVE  BILIRUBINUR SMALL*  KETONESUR NEGATIVE  PROTEINUR NEGATIVE  UROBILINOGEN 2.0*  NITRITE NEGATIVE  LEUKOCYTESUR NEGATIVE   Studies/Results: Dg Chest 1 View  11/23/2013   CLINICAL DATA:  Status post right thoracentesis  EXAM: CHEST - 1 VIEW  COMPARISON:  CT chest  dated 11/21/2013  FINDINGS: No definite pneumothorax is seen status post right thoracentesis.  Mild patchy right lower lobe opacity, suspicious for pneumonia.  Small loculated/layering left pleural effusion.  Cardiomegaly.  Residual contrast in the mid esophagus from recent fluoroscopic study, reflecting esophageal dysmotility and/or reflux.  IMPRESSION: No definite pneumothorax is seen status post right thoracentesis.  Small loculated/layering left pleural effusion.  Mild patchy right lower lobe opacity, suspicious for pneumonia.   Electronically Signed   By: Julian Hy M.D.   On: 11/23/2013 10:46   Dg Esophagus  11/23/2013   CLINICAL DATA:  Difficulty swallowing. Personal history of lung cancer. Generalized weakness. Dysphagia.  EXAM: ESOPHOGRAM/BARIUM SWALLOW  TECHNIQUE: Single contrast examination was performed using  thin barium.  COMPARISON:  Chest CT 11/21/2013.  FLUOROSCOPY TIME:  3 min 15 seconds  FINDINGS: Study is technically suboptimal.  The patient was unable to cooperate with swallowing to adequately distend the esophagus. Only about 4 oz of contrast was ingested throughout the study despite instructions. There was profound esophageal stasis. No gross fixed strictures were identified. Tertiary contractions were observed. Esophageal diverticulum was present in the distal esophagus, just proximal to the gastroesophageal junction. 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 Because of patient debilitation, we could not repositioned patient to optimally visualize this distal esophageal diverticulum. No hiatal hernia was identified. There was not adequate distension to assess for reflux. The epiphrenic diverticulum is best visualized on series 20.  IMPRESSION: Technically suboptimal study due to patient debilitation, inability to cooperate with swallowing and small amount of ingested contrast. No focal strictures or esophageal mass lesions identified. Small epiphrenic diverticulum. Marked esophageal stasis. Tertiary contractions associated with nonspecific esophageal dysmotility disorder.   Electronically Signed   By: Dereck Ligas M.D.   On: 11/23/2013 09:43   US Thoracentesis Asp Pleural Space W/img Guide  11/23/2013   CLINICAL DATA:  Pleural effusion, shortness of breath, request for diagnostic and therapeutic thoracentesis  EXAM: ULTRASOUND GUIDED Right THORACENTESIS  COMPARISON:  None.  FINDINGS: A total of approximately 1.2 liters of serous fluid was removed. A fluid sample was sent for laboratory analysis.  IMPRESSION: Successful ultrasound guided right thoracentesis yielding 1.2 liters of pleural fluid.  Read By:  Tsosie Billing PA-C  PROCEDURE: An ultrasound guided thoracentesis was thoroughly discussed with the patient and questions answered. The benefits, risks, alternatives and complications were also discussed. The patient understands and wishes to proceed with the procedure. Written consent was obtained.  Ultrasound  was performed to localize and mark an adequate pocket of fluid in the right chest. The area was then prepped and draped in the normal sterile fashion. 1% Lidocaine was used for local anesthesia. Under ultrasound guidance a 19 gauge Yueh catheter was introduced. Thoracentesis was performed. The catheter was removed and a dressing applied.  Complications:  none.   Electronically Signed   By: Arne Cleveland M.D.   On: 11/23/2013 10:05   Medications: I have reviewed the patient's current medications. Scheduled Meds: . antiseptic oral rinse  15 mL Mouth Rinse q12n4p  . aspirin EC  81 mg Oral Daily  . chlorhexidine  15 mL Mouth Rinse BID  . folic acid  1 mg Oral Daily  . heparin  5,000 Units Subcutaneous Q8H  . hydrALAZINE  10 mg Oral TID  . ipratropium-albuterol  3 mL Nebulization TID  . lisinopril  10 mg Oral Daily  . multivitamin with minerals  1 tablet Oral Daily  . piperacillin-tazobactam (ZOSYN)  IV  3.375  g Intravenous Q8H  . sodium chloride  3 mL Intravenous Q12H   Continuous Infusions:   PRN Meds:. Assessment/Plan: Principal Problem:   CAP (community acquired pneumonia) Active Problems:   Anemia   Acute encephalopathy   Generalized weakness   Adenocarcinoma, lung s/p partial lobectomy   Dysphagia, unspecified(787.20)   Chronic combined systolic and diastolic congestive heart failure   Substance abuse   Essential hypertension, benign   Unspecified constipation Mr. Stroebel is a 64 year old African American male with combined CHF, lung adenocarcinoma s/p partial lobectomy, recurrent pneumonias, and substance abuse admitted for CAP and acute encephalopathy   ?CAP vs. aspiration given dysphagia and limited swallowing ability.  On IV Zosyn.  NPO for now due to limited swallowing but can get meds. S/p thoracentesis 11/23/13 -continue zosyn -oxygen prn, keep o2 sats >92%  -tylenol prn fever  -blood cultures x2 NGTD -sputum culture to be collected -s/p thoracentesis labs  pending -HIV neg -based on CT findings and patient complicated respiratory history, will discuss with pulmonary for possible bronchoscopy and further evaluation -duoneb q8h  -check pulse ox with ambulation   Acute encephalopathy--much improved today, back to baseline. Discontinued ativan yesterday and clonidine patch that could have been contributing along with no methadone yesterday.  -neuro checks  -will continue to hold methadone for now -fall and seizure precautions  -EEG -NPO for now-SLP to see -PT/OT--CIR -may need to rescan if mental status does not improve   Iron deficiency anemia--Hb 8.5 with MCV 67 on admission, appears stable from 04/2013. Denies any hematuria or bloody BM. Not on iron supplementation at home.  -trend cbc  -recommend starting iron supplementation when evaluated by speech  -consider PPI empiric therapy  Adenocarcinoma of lung s/p left partial lobectomy in [place] (Dr. Cydney Ok once by Dr. Julien Nordmann at [place] [facility] in May 2014 for ?recurrency and did not follow up for recurrent imaging. CT chest on admission shows b/l effusions, mild loculation on left ?multifocal PNA, 87mm nodule at LLL lobe that will need to be follow up in 1 year, mildly enlarged mediastinal nodes.  -will discuss with pulmonary, may need bronch? -will need outpatient oncology follow up   Esophageal dysmotility--barium study done today, suboptimal study, inability to cooperate. No focal stricture or esophageal mass. Small epiphrenic diverticulum and marked esophageal statis. Esophageal dysmotility disorder.  -NPO for now--but speech to re-evaluate  Chronic combined systolic and diastolic heart failure--per echo 03/2013: EF 35-40%, diffuse hypokinesis, moderately reduced systolic function, grade 2 diastolic dysfunction, diffuse hypokinesis. Home medications include ASA 81mg  qd, hydralazine 10mg  tid, and lisinopril 10mg  qd. Weight today 153lb's.  -restarted home meds today  HTN--BP on  admission 153/97. Home medications include lisinopril 10mg  qd and hydralazine 10mg  TID.  NPO for now. -discontinued clonidine path due to sedation -restarted home lisinopril and hydralazine  Hx of substance abuse--hx of heroin and alcohol use, now on methadone. -HIV neg -UDS neg -holding methadone for now  Constipation--reports lack of BM for several days -miralax -bm with enema 2 days ago  Diet: NPO for now pending SLP DVT Ppx: Heparin Dispo: Disposition is deferred at this time, awaiting improvement of current medical problems.  Anticipated discharge in approximately 2-3 day(s).   The patient does have a current PCP (No Pcp Per Patient) and does not need an [facility] hospital follow-up appointment after discharge. Will follow up with community [facility].    The patient does not have transportation limitations that hinder transportation to clinic appointments.  Services Needed at  time of discharge: Y = Yes, Blank = No PT: CIR  OT:   RN:   Equipment:   Other:     LOS: 3 days   Jerene Pitch, MD 11/24/2013, 9:00 AM

## 2013-11-24 NOTE — Consult Note (Signed)
PULMONARY / CRITICAL CARE MEDICINE  Name: Mark [name] MRN: [id number] DOB: 1951-06-03    ADMISSION DATE:  11/21/2013 CONSULTATION DATE:  11/24/2013  REFERRING MD :  Graciella Freer PRIMARY SERVICE:  [facility]  CHIEF COMPLAINT:  Pleural effusion  BRIEF PATIENT DESCRIPTION:  63 year old male admitted on 1/24 with what is likely aspiration PNA in setting of esophageal dysmotility. Had h/o lung cancer w/ LLL lobectomy so had CT scan per recs of heme/onc. CT chest showed mod right effusion and sm loculated left effusion, also of note esophagus was partially filled w/ fluid. Pt underwent/ therapeutic thoracentesis on 1/26 which yielded 1.2 liters of transudate sample. PCCM asked to see re: concern for malignancy  As pt had 47mm nodule at LLL and non-specific  lymphadenopathy (which was similar to prior study).   SIGNIFICANT EVENTS / STUDIES:  1/24 CT chest: 1. Hazy bilateral peribronchial opacities, more prominent at the right lung base, with associated bronchiectasis. More peripheral opacities seen within the left lung. Small to moderate right and small left pleural effusions seen, mildly loculated on left. 4 mm nodule at the left lower lung lobe; this region was not well assessed on prior studies due to airspace opacification. Esophagus partially filled with fluid, raising concern for esophageal dysmotility.Mildly enlarged mediastinal nodes seen, measuring up to 1.3 cm in short axis. Several of these are similar in size on the prior study, and are nonspecific in appearance. 1/24: DG esophagus: No focal strictures or esophageal mass lesions identified. Small epiphrenic diverticulum. Marked esophageal stasis. Tertiary contractions associated with nonspecific esophageal dysmotility disorder. 1/26 right thoracentesis. 1.2 liters. 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 Admitted 1/24 with generalized weakness, confusion, and cough productive of white sputum. Patient also complains of food sticking in his mid-chest area when he swallows. Initial CXR c/w RLL infiltrate. Admitted by medicine team for further evaluation and treatment. CT chest was obtained on day of admit per recommendation of heme/onc with report of recurrent PNA and raised concern about recurrent malignancy. CT chest showed: Hazy bilateral peribronchial opacities, more prominent at the right lung base, with associated bronchiectasis. More peripheral opacities seen within the left lung. Small to moderate right and small left pleural effusions seen, mildly loculated on left. 4 mm nodule at the left lower lung lobe; Small to moderate right and small left pleural effusions seen, mildly loculated on left. Esophagus partially filled with fluid, raising concern for esophageal dysmotility. F/u esophagram showed marked esophageal stasis. Based on his CT findings he underwent right thoracentesis on 1/25 which yielded 1.2 liters which was transudate by Light's criteria. Samples were sent for culture and cytology. F/U CXR obtained showed residual RLL airspace disease but improved aeration, but some increase in left loculated effusion. PCCM was asked to see re: possibility of bronchoscopy for further evaluation of malignancy.   PAST MEDICAL HISTORY :  Past Medical History  Diagnosis Date  . Essential hypertension, benign  02/26/2013  . Liver  lesion     a. Previously biopsy-proven to be a cyst (per records from a hospital in [place]).  . Hypoxia     a. Adm 02/2013, required home O2.  . Drug abuse   . Depression   . Seizure disorder     "after hip OR 02/2012 once I got home" (04/01/2013)  . GI bleed     a. 2009 at time of stroke - received 3 u blood, no source found per pt  . HTN (hypertension)   . On home oxygen therapy     2.5L "usually use it at night" (04/01/2013)  . Tuberculosis   . Positive TB test   . History of blood transfusion 2009    "don't know what it was related to" (04/01/2013)  . Stroke 2009    a. Pt reports several, last in 2009. b. Residual L sided weakness.  . Anxiety   . Adenocarcinoma, lung     a. Dx 2013, underwent left lower lobectomy under the care of Dr.Lapunzina in [place] for a stage I non-small cell lung CA.; "went to [facility] ~ 06/2012 and they couldn't find anything" (04/01/2013)  . Pneumonia   . Coronary atherosclerosis of native coronary artery    Past Surgical History  Procedure Laterality Date  . Hip arthroplasty Left 02/2012  . Lung biopsy Left 07/15/2012    "LLL" (04/01/2013)  . Lung removal, partial Left     /notes 02/26/2013  (04/01/2013)   Prior to Admission medications   Medication Sig Start Date End Date Taking? Authorizing Provider  aspirin EC 81 MG EC tablet Take 1 tablet (81 mg total) by mouth daily. 03/04/13  Yes Estela Leonie Green, MD  folic acid (FOLVITE) 1 MG tablet Take 1 tablet (1 mg total) by mouth daily. 04/05/13  Yes Estela Leonie Green, MD  hydrALAZINE (APRESOLINE) 10 MG tablet Take 1 tablet (10 mg total) by mouth 3 (three) times daily. 11/06/13  Yes Montine Circle, PA-C  levofloxacin (LEVAQUIN) 750 MG tablet Take 1 tablet (750 mg total) by mouth daily. X 5 days 11/19/13  Yes Elyn Peers, MD  lisinopril (PRINIVIL,ZESTRIL) 10 MG tablet Take 10 mg by mouth daily.   Yes Historical Provider, MD  METHADONE HCL PO Take 63 mg by mouth daily. As directed   Yes Historical  Provider, MD  polyethylene glycol powder (GLYCOLAX/MIRALAX) powder Take 17 g by mouth 2 (two) times daily. Until daily soft stools  OTC 11/06/13  Yes Montine Circle, PA-C  potassium chloride (K-DUR) 10 MEQ tablet Take 1 tablet (10 mEq total) by mouth once. 11/06/13  Yes Montine Circle, PA-C   No Known Allergies  FAMILY HISTORY:  Family History  Problem Relation Age of Onset  . Stroke    . CAD Neg Hx    SOCIAL HISTORY:  reports that he quit smoking about 20 months ago. His smoking use included Cigarettes. He has a 21 pack-year smoking history. He has never used smokeless tobacco. He reports that he drinks about 29.4 ounces of alcohol per week. He reports that he does not use illicit drugs.  REVIEW OF SYSTEMS:   Constitutional: Negative for fever, chills, weight loss, malaise/fatigue and diaphoresis.  HENT: Negative for hearing loss, ear pain, nosebleeds, congestion, sore throat, neck pain, tinnitus and ear discharge.   Respiratory: Negative for cough, hemoptysis, sputum production, shortness of breath, wheezing and stridor.   Cardiovascular: Negative for chest pain, palpitations, orthopnea, claudication, leg swelling and PND.  Gastrointestinal: Negative for heartburn, nausea, vomiting, abdominal pain, diarrhea, constipation, blood in stool and melena.  Genitourinary: Negative for dysuria, urgency, frequency, hematuria and flank pain.  Musculoskeletal: Negative for myalgias, back pain, joint pain and falls.  Skin: Negative for itching and rash.  Neurological: Negative for dizziness, tingling, tremors, sensory change, speech change, focal weakness, seizures, loss of consciousness, weakness and headaches.  Endo/Heme/Allergies: Negative for environmental allergies and polydipsia. Does not bruise/bleed easily.  SUBJECTIVE:   VITAL SIGNS: Temp:  [97.4 F (36.3 C)-97.9 F (36.6 C)] 97.8 F (36.6 C) (01/27 0441) Pulse Rate:  [78-83] 78 (01/27 0441) Resp:  [18] 18 (01/27 0441) BP:  (151-162)/(99-106) 162/99 mmHg (01/27 0441) SpO2:  [98 %-100 %] 98 % (01/27 0823) Weight:  [68.7 kg (151 lb 7.3 oz)] 68.7 kg (151 lb 7.3 oz) (01/27 0350)  PHYSICAL EXAMINATION: General:  Chronically ill appearing aam no acute distress Neuro:  Awake, oriented HEENT:  edentulous  Cardiovascular: rrr Lungs:  Clear  Abdomen:  Soft, non-tender  Musculoskeletal:  Intact  Skin:  Intact   Recent Labs Lab 11/22/13 0112 11/23/13 0505 11/24/13 0716  NA 141 141 141  K 3.6* 4.5 4.2  CL 104 104 106  CO2 24 21 23   BUN 15 14 18   CREATININE 0.89 0.88 0.99  GLUCOSE 85 79 78    Recent Labs Lab 11/21/13 0942 11/22/13 0112 11/24/13 0716  HGB 8.5* 8.2* 8.6*  HCT 28.4* 27.5* 29.0*  WBC 7.9 7.7 7.9  PLT 319 336 356   Dg Chest 1 View  11/23/2013   CLINICAL DATA:  Status post right thoracentesis  EXAM: CHEST - 1 VIEW  COMPARISON:  CT chest dated 11/21/2013  FINDINGS: No definite pneumothorax is seen status post right thoracentesis.  Mild patchy right lower lobe opacity, suspicious for pneumonia.  Small loculated/layering left pleural effusion.  Cardiomegaly.  Residual contrast in the mid esophagus from recent fluoroscopic study, reflecting esophageal dysmotility and/or reflux.  IMPRESSION: No definite pneumothorax is seen status post right thoracentesis.  Small loculated/layering left pleural effusion.  Mild patchy right lower lobe opacity, suspicious for pneumonia.   Electronically Signed   By: Julian Hy M.D.   On: 11/23/2013 10:46   Dg Esophagus  11/23/2013   CLINICAL DATA:  Difficulty swallowing. Personal history of lung cancer. Generalized weakness. Dysphagia.  EXAM: ESOPHOGRAM/BARIUM SWALLOW  TECHNIQUE: Single contrast examination was performed using  thin barium.  COMPARISON:  Chest CT 11/21/2013.  FLUOROSCOPY TIME:  3 min 15 seconds  FINDINGS: Study is technically suboptimal. The patient was unable to cooperate with swallowing to adequately distend the esophagus. Only about 4 oz of  contrast was ingested throughout the study despite instructions. There was profound esophageal stasis. No gross fixed strictures were identified. Tertiary contractions were observed. Esophageal diverticulum was present in the distal esophagus, just proximal to the gastroesophageal junction. 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 Because of patient debilitation, we could not repositioned patient to optimally visualize this distal esophageal diverticulum. No hiatal hernia was identified. There was not adequate distension to assess for reflux. The epiphrenic diverticulum is best visualized on series 20.  IMPRESSION: Technically suboptimal study due to patient debilitation, inability to cooperate with swallowing and small amount of ingested contrast. No focal strictures or esophageal mass lesions identified. Small epiphrenic diverticulum. Marked esophageal stasis. Tertiary contractions associated with nonspecific esophageal dysmotility disorder.   Electronically Signed   By: Dereck Ligas M.D.   On: 11/23/2013 09:43   US Thoracentesis Asp Pleural Space  W/img Guide  11/23/2013   CLINICAL DATA:  Pleural effusion, shortness of breath, request for diagnostic and therapeutic thoracentesis  EXAM: ULTRASOUND GUIDED Right THORACENTESIS  COMPARISON:  None.  FINDINGS: A total of approximately 1.2 liters of serous fluid was removed. A fluid sample was sent for laboratory analysis.  IMPRESSION: Successful ultrasound guided right thoracentesis yielding 1.2 liters of pleural fluid.  Read By:  Tsosie Billing PA-C  PROCEDURE: An ultrasound guided thoracentesis was thoroughly discussed with the patient and questions answered. The benefits, risks, alternatives and complications were also discussed. The patient understands and wishes to proceed with the procedure. Written consent was obtained.  Ultrasound was performed to localize and mark an adequate pocket of fluid in the right chest. The area was then prepped  and draped in the normal sterile fashion. 1% Lidocaine was used for local anesthesia. Under ultrasound guidance a 19 gauge Yueh catheter was introduced. Thoracentesis was performed. The catheter was removed and a dressing applied.  Complications:  none.   Electronically Signed   By: Arne Cleveland M.D.   On: 11/23/2013 10:05   ASSESSMENT / PLAN: Recurrent pneumonia, likely secondary to aspiration in setting of esophageal dysmotility  Right sided TRANSUDATIVE  Effusion, likely secondary to known systolic cardiomyopathy (EF 35-40%) Loculated left effusion, possibly parapneumonic  Small LLL pulmonary nodule / lymphadenopathy in setting of prior NSCLC and LLL lobectomy.  Systolic heart failure failur EF 35-40% Esophageal dysmotility / dysphagia   Think his primary issue is a mix of chronic aspiration (in setting of esophageal dysmotility) super-imposed on systolic heart failure. As the effusion is transudate it is not likely to be malignant and probably related to heart failure. The loculated left effusion could be parapneumonic but it is small, and of little clinical consequence. The pulmonary nodule is small, not amendable to biopsy, beyond PET/CT resolution and likely benign - it should be followed conservatively. Lymph node did not change significantly compared to 2013 chest CT.   Recommendations  -follow pleural fluid cytology -reflux precautions / PPI -aggressive diuresis as BP and creatinine allow -complete antibiotics -bronchoscopy / biopsy is not indicated -would re image in 6-12 months   -please set up with [facility] upon discharge -will sign off, please re consult PRN  Doree Fudge, MD Pulmonary and [place] Pager: [phone]  11/24/2013, 11:28 AM

## 2013-11-25 LAB — CBC
HEMATOCRIT: 29.8 % — AB (ref 39.0–52.0)
HEMOGLOBIN: 9 g/dL — AB (ref 13.0–17.0)
MCH: 19.8 pg — ABNORMAL LOW (ref 26.0–34.0)
MCHC: 30.2 g/dL (ref 30.0–36.0)
MCV: 65.6 fL — ABNORMAL LOW (ref 78.0–100.0)
Platelets: 318 10*3/uL (ref 150–400)
RBC: 4.54 MIL/uL (ref 4.22–5.81)
RDW: 18.3 % — AB (ref 11.5–15.5)
WBC: 6.8 10*3/uL (ref 4.0–10.5)

## 2013-11-25 LAB — COMPREHENSIVE METABOLIC PANEL
ALT: 73 U/L — ABNORMAL HIGH (ref 0–53)
AST: 121 U/L — ABNORMAL HIGH (ref 0–37)
Albumin: 2.1 g/dL — ABNORMAL LOW (ref 3.5–5.2)
Alkaline Phosphatase: 112 U/L (ref 39–117)
BUN: 17 mg/dL (ref 6–23)
CALCIUM: 8 mg/dL — AB (ref 8.4–10.5)
CO2: 21 mEq/L (ref 19–32)
Chloride: 109 mEq/L (ref 96–112)
Creatinine, Ser: 0.99 mg/dL (ref 0.50–1.35)
GFR calc non Af Amer: 85 mL/min — ABNORMAL LOW (ref >90)
GLUCOSE: 77 mg/dL (ref 70–99)
Potassium: 4.5 mEq/L (ref 3.7–5.3)
Sodium: 142 mEq/L (ref 137–147)
TOTAL PROTEIN: 7.2 g/dL (ref 6.0–8.3)
Total Bilirubin: 0.8 mg/dL (ref 0.3–1.2)

## 2013-11-25 LAB — GLUCOSE, CAPILLARY
GLUCOSE-CAPILLARY: 99 mg/dL (ref 70–99)
GLUCOSE-CAPILLARY: 130 mg/dL — AB (ref 70–99)
GLUCOSE-CAPILLARY: 94 mg/dL (ref 70–99)
GLUCOSE-CAPILLARY: 129 mg/dL — AB (ref 70–99)
Glucose-Capillary: 85 mg/dL (ref 70–99)
Glucose-Capillary: 127 mg/dL — ABNORMAL HIGH (ref 70–99)

## 2013-11-25 LAB — ADENOSINE DEAMINASE, FLUID: Adenosine Deaminase, Fluid: 3.9 U/L (ref <7.6)

## 2013-11-25 LAB — TROPONIN I: Troponin I: <0.3 ng/mL (ref <0.30)

## 2013-11-25 MED ORDER — METHADONE HCL 10 MG PO TABS
20.0000 mg | ORAL_TABLET | Freq: Once | ORAL | Status: AC
  Administered 2013-11-25: 20 mg via ORAL

## 2013-11-25 MED ORDER — METHADONE HCL 10 MG PO TABS
30.0000 mg | ORAL_TABLET | Freq: Every day | ORAL | Status: DC
  Administered 2013-11-25 – 2013-11-26 (×2): 30 mg via ORAL
  Filled 2013-11-25 (×3): qty 3

## 2013-11-25 MED ORDER — METHADONE HCL 10 MG PO TABS: 10.0000 mg | ORAL_TABLET | Freq: Once | ORAL | Status: DC

## 2013-11-25 MED ORDER — IPRATROPIUM-ALBUTEROL 0.5-2.5 (3) MG/3ML IN SOLN: 3.0000 mL | Freq: Four times a day (QID) | RESPIRATORY_TRACT | Status: DC | PRN

## 2013-11-25 MED ORDER — PROMETHAZINE HCL 25 MG/ML IJ SOLN
6.2500 mg | Freq: Four times a day (QID) | INTRAMUSCULAR | Status: DC | PRN
  Administered 2013-11-25: 6.25 mg via INTRAVENOUS
  Filled 2013-11-25: qty 1

## 2013-11-25 NOTE — Clinical Social Work Note (Signed)
CSW contact [facility] per son in [place] request. Mendel Corning states that they will not take patient, even with LOG.   Liz Beach, [place], [place], [phone]

## 2013-11-25 NOTE — Progress Notes (Signed)
I met with pt at bedside. Briefly discussed inpt rehab admission as an option for his rehab recovery pending a bed available over the next 24 hrs. I have left a message for his daughter to contact me to discuss. I will know in the morning the possibility of bed and will follow up with RN CM and SW. [phone]

## 2013-11-25 NOTE — Progress Notes (Signed)
  I have seen and examined the patient, and reviewed the daily progress note by Corey Skains, MS 4 and discussed the care of the patient with them. Please see my progress note from 11/25/2013 for further details regarding assessment and plan.    Signed:  Jerene Pitch, MD 11/25/2013, 9:24 PM

## 2013-11-25 NOTE — Progress Notes (Signed)
Called at 1700 to assist with patient c/o sudden onset chest pain. On arrival patient supine in bed - awake and alert - warm and dry - states he feels his nerves and his heart going back and forth with one another.  RR 22 - BP maxed at  207/146 - has been climbing since 12 noon today -  HR ST 102 O2 sats 99% on 2 liter nasal cannula.  Lungs clear bilaterally.  12 lead EKG without changes.  No SOB - says he gets nausea with nerves sometimes.  Dr. Eula Fried to room - patient has been off Methadone for 2 days- restarted today at 2pm.  Orders for IM Methadone - and cycle troponins noted.  Patient stating to Dr. Eula Fried he feels ok now but his nerves come and go - BP 187/108 - Cort[place] RN to call as needed.

## 2013-11-25 NOTE — Progress Notes (Signed)
Subjective: Mark [name] was seen and examined at bedside. He is awake and alert and talking again this morning.  He is willing to get out of bed today and work with PT and/or sit in chair.  He is tolerating diet well.   Objective: Vital signs in last 24 hours: Filed Vitals:   11/25/13 0757 11/25/13 0833 11/25/13 1209 11/25/13 1347  BP: 154/95  165/107 170/91  Pulse: 84  86 92  Temp: 97.5 F (36.4 C)  97.4 F (36.3 C) 97.5 F (36.4 C)  TempSrc: Oral  Oral Oral  Resp: 18  18 18   Height:      Weight:      SpO2: 100% 96% 100% 100%   Weight change: -3 lb 12 oz (-1.7 kg)  Intake/Output Summary (Last 24 hours) at 11/25/13 1416 Last data filed at 11/25/13 1213  Gross per 24 hour  Intake    120 ml  Output    900 ml  Net   -780 ml   Vitals reviewed. General: NAD, lying in bed HEENT: PERRL, EOMI Cardiac: RRR Pulm:mild bibasilar crackles  Abd: soft, nontender, nondistended, BS present Ext: warm and well perfused, no pedal edema, moving all 4 extremities Neuro: alert awake and oriented x3, strength equal in all extremities, sensation grossly intact  Lab Results: Basic Metabolic Panel:  Recent Labs Lab 11/22/13 1000  11/24/13 0716 11/25/13 0659  NA  --   < > 141 142  K  --   < > 4.2 4.5  CL  --   < > 106 109  CO2  --   < > 23 21  GLUCOSE  --   < > 78 77  BUN  --   < > 18 17  CREATININE  --   < > 0.99 0.99  CALCIUM  --   < > 8.0* 8.0*  MG 1.9  --   --   --   < > = values in this interval not displayed. Liver Function Tests:  Recent Labs Lab 11/23/13 0505 11/25/13 0659  AST 171* 121*  ALT 94* 73*  ALKPHOS 139* 112  BILITOT 1.2 0.8  PROT 7.5  7.5 7.2  ALBUMIN 2.4* 2.1*    Recent Labs Lab 11/19/13 0139  AMMONIA 35   CBC:  Recent Labs Lab 11/21/13 0942  11/24/13 0716 11/25/13 0659  WBC 7.9  < > 7.9 6.8  NEUTROABS 5.9  --  5.0  --   HGB 8.5*  < > 8.6* 9.0*  HCT 28.4*  < > 29.0* 29.8*  MCV 67.0*  < > 66.2* 65.6*  PLT 319  < > 356 318  < > = values  in this interval not displayed. Cardiac Enzymes:  Recent Labs Lab 11/21/13 1145 11/22/13 0112  TROPONINI <0.30 <0.30   BNP:  Recent Labs Lab 11/18/13 2303  PROBNP 1975.0*   CBG:  Recent Labs Lab 11/24/13 1638 11/24/13 2029 11/25/13 0002 11/25/13 0350 11/25/13 0755 11/25/13 1206  GLUCAP 127* 144* 127* 99 85 130*   Urine Drug Screen: Drugs of Abuse     Component Value Date/Time   LABOPIA NONE DETECTED 11/22/2013 0702   COCAINSCRNUR NONE DETECTED 11/22/2013 0702   LABBENZ NONE DETECTED 11/22/2013 0702   AMPHETMU NONE DETECTED 11/22/2013 0702   THCU NONE DETECTED 11/22/2013 0702   LABBARB NONE DETECTED 11/22/2013 0702    Alcohol Level:  Recent Labs Lab 11/19/13 0139  ETH <11   Urinalysis:  Recent Labs Lab 11/21/13 [place]  LABSPEC 1.029  PHURINE 5.5  GLUCOSEU NEGATIVE  HGBUR NEGATIVE  BILIRUBINUR SMALL*  KETONESUR NEGATIVE  PROTEINUR NEGATIVE  UROBILINOGEN 2.0*  NITRITE NEGATIVE  LEUKOCYTESUR NEGATIVE   Medications: I have reviewed the patient's current medications. Scheduled Meds: . antiseptic oral rinse  15 mL Mouth Rinse q12n4p  . aspirin EC  81 mg Oral Daily  . chlorhexidine  15 mL Mouth Rinse BID  . folic acid  1 mg Oral Daily  . heparin  5,000 Units Subcutaneous Q8H  . hydrALAZINE  10 mg Oral TID  . ipratropium-albuterol  3 mL Nebulization TID  . lisinopril  10 mg Oral Daily  . methadone  30 mg Oral Daily  . multivitamin with minerals  1 tablet Oral Daily  . piperacillin-tazobactam (ZOSYN)  IV  3.375 g Intravenous Q8H  . sodium chloride  3 mL Intravenous Q12H   Continuous Infusions:   PRN Meds:. Assessment/Plan: Principal Problem:   CAP (community acquired pneumonia) Active Problems:   Anemia   Acute encephalopathy   Generalized weakness   Adenocarcinoma, lung s/p partial lobectomy   Dysphagia, unspecified(787.20)   Chronic combined systolic and diastolic congestive heart failure   Substance abuse   Essential  hypertension, benign   Unspecified constipation   Pleural effusion   Pulmonary nodule   Aspiration pneumonia   Lymphadenopathy Mark [name] is a 63 year old African American male with combined CHF, lung adenocarcinoma s/p partial lobectomy, recurrent pneumonias, and substance abuse admitted for CAP and acute encephalopathy   Aspiration PNA in setting of esophageal dysmotility.  On IV Zosyn and seems to be improving.  S/p thoracentesis 11/23/13. No malignant cells on cytology.  -continue zosyn for now, transition to augmentin 875mg  po bid total 10 days likely tomorrow, today is day 5/10  -oxygen prn, keep o2 sats >92%  -tylenol prn fever  -blood cultures x2 NGTD -sputum culture to be collected -s/p thoracentesis--transudative, AFB and fungal cultures pending -based on CT findings and patient complicated respiratory history -pulmonary signed off, no role for bronchoscopy, add PPI, consider diuresis (but will hold off for now), re-image 6-12 months, follow up pulmonary on discharge -duoneb q8h  -check pulse ox with ambulation   Acute encephalopathy--much improved today, seemsback to baseline. Will need to restart lower dose of methadone to prevent withdrawal -neuro checks  -methadone 30mg  daily for now -fall and seizure precautions  -EEG--mild generalized non-specific encephalopathy, no seizure or seizure predisposition -PT/OT--CIR, family in agreement for CIR more than SNF  Iron deficiency anemia--Hb 8.5 with MCV 67 on admission, appears stable from 04/2013. Denies any hematuria or bloody BM. Not on iron supplementation at home.  -trend cbc  -recommend starting iron supplementation but will have to confirm if can be crushed based on current diet -consider PPI empiric therapy, verify which one can be crushed  Adenocarcinoma of lung s/p left partial lobectomy in [place] (Dr. Cydney Ok once by Dr. Julien Nordmann at [place] [facility] in May 2014 for ?recurrency and did not follow up for recurrent  imaging. CT chest on admission shows b/l effusions, mild loculation on left ?multifocal PNA, 70mm nodule at LLL lobe that will need to be follow up in 1 year, mildly enlarged mediastinal nodes.  -re-image 6-12 months, cytology with no malignant cells -will need outpatient oncology and pulmonary follow up  Esophageal dysmotility--barium study done today, suboptimal study, inability to cooperate. No focal stricture or esophageal mass. Small epiphrenic diverticulum and marked esophageal statis. Esophageal dysmotility disorder.  -dys 1 diet puree, thin liquid, meds  crushed with puree  Chronic combined systolic and diastolic heart failure--per echo 03/2013: EF 35-40%, diffuse hypokinesis, moderately reduced systolic function, grade 2 diastolic dysfunction, diffuse hypokinesis. Home medications include ASA 81mg  qd, hydralazine 10mg  tid, and lisinopril 10mg  qd. Weight today 153lb's.  -continue lisinopril and hydralazine and asa  HTN--Home medications include lisinopril 10mg  qd and hydralazine 10mg  TID.  -continue home lisinopril and hydralazine  Hx of substance abuse--hx of heroin and alcohol use, now on methadone. -HIV neg -UDS neg -restarted methadone for now at 30mg  daily  Constipation--bm after enema this admission -miralax  Diet: dys 1 diet puree, thin liquid, meds crushed with puree DVT Ppx: Heparin Dispo: possibly 1-2 days, pending CIR  The patient does have a current PCP (No Pcp Per Patient) and does not need an [facility] hospital follow-up appointment after discharge. [facility] with [facility].    The patient does not have transportation limitations that hinder transportation to clinic appointments.  Services Needed at time of discharge: Y = Yes, Blank = No PT: CIR  OT:   RN:   Equipment:   Other:     LOS: 4 days   Jerene Pitch, MD 11/25/2013, 2:16 PM

## 2013-11-25 NOTE — Progress Notes (Signed)
  I have seen and examined the patient, and reviewed the daily progress note by Corey Skains, MS 4 and discussed the care of the patient with them. Please see my progress note from 11/24/2013 for further details regarding assessment and plan.    Signed:  Jerene Pitch, MD 11/25/2013, 7:25 AM

## 2013-11-25 NOTE — Progress Notes (Signed)
ANTIBIOTIC CONSULT NOTE - Follow up  Pharmacy Consult for Zosyn Indication: pneumonia multifocal per CT  No Known Allergies  Patient Measurements: Height: 5\' 11"  (180.3 cm) Weight: 147 lb 11.3 oz (67 kg) IBW/kg (Calculated) : 75.3   Vital Signs: Temp: 97.4 F (36.3 C) (01/28 1209) Temp src: Oral (01/28 1209) BP: 165/107 mmHg (01/28 1209) Pulse Rate: 86 (01/28 1209) Intake/Output from previous day: 01/27 0701 - 01/28 0700 In: -  Out: 500 [Urine:500] Intake/Output from this shift: Total I/O In: 120 [P.O.:120] Out: 400 [Urine:400]  Labs:  Recent Labs  11/23/13 0505 11/24/13 0716 11/25/13 0659  WBC  --  7.9 6.8  HGB  --  8.6* 9.0*  PLT  --  356 318  CREATININE 0.88 0.99 0.99   Estimated Creatinine Clearance: 72.4 ml/min (by C-G formula based on Cr of 0.99). No results found for this basename: VANCOTROUGH, VANCOPEAK, VANCORANDOM, GENTTROUGH, GENTPEAK, GENTRANDOM, TOBRATROUGH, TOBRAPEAK, TOBRARND, AMIKACINPEAK, AMIKACINTROU, AMIKACIN,  in the last 72 hours    Assessment: 62yom with Hx lung Ca s/p lobectomy admitted with multifocal pna per chest CT.  Afebrile, WBC wnl, CrCl ~70 ml/min.  On Zosyn for possible aspiration.  Zosyn 1/25>>  Azithromycin 1/24>>1/24 Ceftriaxone 1/24>> 1/25   1/25 Body fluid Cx>ngtd 1/24 Blood Cx x2 >>ngtd  1/26 Fungus Cx >> ngtd    Goal of Therapy:  Eradication of infection  Plan:  1) Zosyn 3.375 GM IV q8 EI  2) Monitor CBCs, cultures, renal fx and patient's clinical status    Albertina Parr, PharmD.  Clinical Pharmacist Pager [phone]

## 2013-11-25 NOTE — Care Management Note (Addendum)
    Page 1 of 2   11/27/2013     5:06:25 PM   CARE MANAGEMENT NOTE 11/27/2013  Patient:  Mark, [name]   Account Number:  [id number]  Date Initiated:  11/24/2013  Documentation initiated by:  Tomi Bamberger  Subjective/Objective Assessment:   dx pna  admit- lives alone.     Action/Plan:   pt eval- rec cir;snf   Anticipated DC Date:  11/27/2013   Anticipated DC Plan:  SKILLED NURSING FACILITY  In-house referral  Clinical Social Worker      DC Planning Services  CM consult      Choice offered to / List presented to:             Status of service:  Completed, signed off Medicare Important Message given?   (If response is "NO", the following Medicare IM given date fields will be blank) Date Medicare IM given:   Date Additional Medicare IM given:    Discharge Disposition:  [place]  Per UR Regulation:  Reviewed for med. necessity/level of care/duration of stay  If discussed at [place] of Stay Meetings, dates discussed:    Comments:  11/27/13 10:15 Tomi Bamberger RN, BSN [phone] patient is for dc to [place] SNF today or CIR.  CSW informed NCM that SNF would not be able to take patient unless MD wrote script for methadone, which resident state they could not do, and the attending could not do because for opiod dependence.  NCM called Medical directior and left message and also called Director , CSW called English as a second language teacher.  Dr. Tristan Schroeder with CIR came up also to look at the patient for CIR and [facility]  and stated they would not be able to take patient because he does not have 24 hr care at home. Environmental education officer of CSW called facility and they state they could not take patient today and would consider taking patient next wee with the script.  The attending Md will provide the script for the snf next week providing the receiving Md will continue the script.  11/24/13 Tomi Bamberger RN, BSN [phone] patient lives alone, per physical therapy eval recs CIR vs SNF, CSW  referral.

## 2013-11-25 NOTE — Clinical Social Work Note (Signed)
CSW attempted to call patient's daughter to offer bed offer. Patient has one bed available in [place], [place], [place], [place], [phone]

## 2013-11-25 NOTE — Progress Notes (Signed)
Discussed with resident that I will see pt today, no bed for inpt rehab today. I will let RN CM know of prospects for potential bed for tomorrow after assessment of pt today. [phone]

## 2013-11-25 NOTE — Progress Notes (Signed)
Medical Student Daily Progress Note  This is a 63 year old man with a PMH of L lung adenocarcinoma s/p partial lobectomy without chemo/radiation in 2009 and history of TB, as well as multiple lacunar strokes with mild vascular dementia, CAD, HTN, previous GI bleed, depression and anxiety, and polysubstance abuse now on methadone, admitted for acute encephalopathy and multilobar CAP, likely aspiration PNA.  Subjective: Much more alert and oriented this morning and afternoon, able to participate in discussion re: plan of care. Patient really would like to continue on methadone to help abate his opioid cravings but is agreeable to a lower dose. Agreeable to CIR on discussion with me. Denies chest pain, SOB, abd discomfort, hip pain. One episode of vomiting this afternoon following a sensation that food was "stuck", coughing, then nausea, then vomiting. Patient also complains of feeling "nerves" today.   Objective: Vital signs in last 24 hours: Filed Vitals:   11/25/13 0757 11/25/13 0833 11/25/13 1209 11/25/13 1347  BP: 154/95  165/107 170/91  Pulse: 84  86 92  Temp: 97.5 F (36.4 C)  97.4 F (36.3 C) 97.5 F (36.4 C)  TempSrc: Oral  Oral Oral  Resp: 18  18 18   Height:      Weight:      SpO2: 100% 96% 100% 100%    Weight change: -1.7 kg (-3 lb 12 oz)   Intake/Output Summary (Last 24 hours) at 11/25/13 1458 Last data filed at 11/25/13 1213  Gross per 24 hour  Intake    120 ml  Output    900 ml  Net   -780 ml    Physical Exam: GENERAL: sitting up in bed, alert, in NAD.  NEURO: Alert and oriented x 3. Cooperative with exam.  HEART: RRR, crisp S1, normal S2, no murmurs, rubs, or gallops.  LUNGS: Diminished air movement in upper lobes bilaterally. Lower lobes with coarse crackles, reduced from yesterday, R>L. On O2 per nasal cannula.  ABDOMEN: NABS. Soft, nontender, and nondistended.  EXTREMITIES: No pretibial edema.  Lab Results: BMET    Component Value Date/Time   NA 142  11/25/2013 0659   K 4.5 11/25/2013 0659   CL 109 11/25/2013 0659   CO2 21 11/25/2013 0659   GLUCOSE 77 11/25/2013 0659   BUN 17 11/25/2013 0659   CREATININE 0.99 11/25/2013 0659   CALCIUM 8.0* 11/25/2013 0659   GFRNONAA 85* 11/25/2013 0659   GFRAA >90 11/25/2013 0659    CBC    Component Value Date/Time   WBC 6.8 11/25/2013 0659   RBC 4.54 11/25/2013 0659   RBC 4.22 04/01/2013 0819   HGB 9.0* 11/25/2013 0659   HCT 29.8* 11/25/2013 0659   PLT 318 11/25/2013 0659   MCV 65.6* 11/25/2013 0659   MCH 19.8* 11/25/2013 0659   MCHC 30.2 11/25/2013 0659   RDW 18.3* 11/25/2013 0659   LYMPHSABS 1.7 11/24/2013 0716   MONOABS 0.7 11/24/2013 0716   EOSABS 0.5 11/24/2013 0716   BASOSABS 0.0 11/24/2013 0716   Pleural fluid cytology with reactive mesothelial cells, NEGATIVE for malignant cells.  Studies/Results: No results found.  Medications:  Scheduled Meds: . antiseptic oral rinse  15 mL Mouth Rinse q12n4p  . aspirin EC  81 mg Oral Daily  . chlorhexidine  15 mL Mouth Rinse BID  . folic acid  1 mg Oral Daily  . heparin  5,000 Units Subcutaneous Q8H  . hydrALAZINE  10 mg Oral TID  . ipratropium-albuterol  3 mL Nebulization TID  . lisinopril  10  mg Oral Daily  . methadone  30 mg Oral Daily  . multivitamin with minerals  1 tablet Oral Daily  . piperacillin-tazobactam (ZOSYN)  IV  3.375 g Intravenous Q8H  . sodium chloride  3 mL Intravenous Q12H   Continuous Infusions:  PRN Meds:.promethazine  Assessment/Plan:  Acute Encephalopathy. Most likely delirium superimposed on baseline mild dementia, see previous notes for further discussion of ddx. Currently resolved, patient lucid. - restarted methadone at 30 mg/day after long discussion with ADS nurse and pharmacy. Patient's liver function improving; now off doxycycline which may have slightly slowed its metabolism. Will need to balance its sedating effect with need to prevent withdrawal and discomfort of opioid cravings. If patient becomes somnolent again,  will hold x 1 day and decrease to 20 mg daily. - re-orientation as needed  - minimize psychoactive meds  - PT/OT following, CIR evaluating.  - fall precautions   Cough/SOB: Likely aspiration pneumonia; Zosyn to cover for anaerobes given aspiration risk. Cytology neg for malignancy. Pulmonary consulted and recommended tx for aspiration PNA, aspiration precautions, imaging follow up in 6 mos to one year.  - Zosyn 3.35g IV q8; plan to transition to Augmentin 875 mg BID on day of discharge. Today is day 5/10 of antibiotic therapy.  - appreciate pulmonology recommendations.  - Duonebs Q6h   Dysphagia/Odynophagia: CT showed residual fluid in esophagus; barium swallow suggestive of dysmotility or with epiphrenic diverticulum. GI consulted, recommend against endoscopy, recommended no further workup. Episode of choking/vomiting on food today, no new shortness of breath or O2 requirement. - SLP following, recommended dysphagia 1 diet (puree, with crushed meds)   Shaking episodes: Possible the patient has been having complex partial vs simple partial seizures given the family's reports; however, these episodes have been much less frequent lately thus are not likely related to his functional decline. No episodes reported during current hospitalization.  - EEG shows nonspecific changes c/w encephalopathy. - consider outpt neuro f/u   CHF/CAD/HTN: currently stable. Patient still looks clinically euvolemic to slightly dry. Will defer diuresis for now given adequate oxygenation, no LE edema.  - aspirin 81 mg daily  - hydralazine 10 mg TID  - lisinopril 10 mg daily  - telemetry   Anemia: Hgb stable at 8.2 today. MCV 67 with polychromasia on smear, strongly suggestive of iron deficiency anemia.  - patient now on MVI  Transaminitis with small volume ascites on chest CT in setting of HCV+ patient. ALP, AST, ALT improved today but not yet wnl. 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  - daily CMET  - GI recommended abd CT vs liver U/s to rule out liver metastases; will discuss at family meeting tomorrow.  FEN/GI:  - SLP following  - Dysphagia 1 diet  - IV fluids discontinued  - soap suds enemas PRN constipation   PPx:  - SQH  - ambulation with assistance when mental status improves  - aspiration precautions   LOS: 4 days   This is a Careers information officer Note.  The care of the patient was discussed with Dr. Eula Fried and the assessment and plan formulated with their assistance.  Please see their attached note for official documentation of the daily encounter.  Leeland Lovelady 11/25/2013, 2:58 PM

## 2013-11-25 NOTE — Progress Notes (Signed)
Internal Medicine Attending  Date: 11/25/2013  Patient name: Mark [name] Medical record number: [id number] Date of birth: Dec 21, 1950 Age: 63 y.o. Gender: male  I saw and evaluated the patient, and discussed his care on A.M rounds with housestaff.  I reviewed the resident's note by Dr. Eula Fried and I agree with the resident's findings and plans as documented in her note.

## 2013-11-26 DIAGNOSIS — D509 Iron deficiency anemia, unspecified: Secondary | ICD-10-CM

## 2013-11-26 DIAGNOSIS — K224 Dyskinesia of esophagus: Secondary | ICD-10-CM

## 2013-11-26 DIAGNOSIS — C3490 Malignant neoplasm of unspecified part of unspecified bronchus or lung: Secondary | ICD-10-CM

## 2013-11-26 LAB — BODY FLUID CULTURE
CULTURE: NO GROWTH
Special Requests: 60

## 2013-11-26 LAB — GLUCOSE, CAPILLARY
GLUCOSE-CAPILLARY: 100 mg/dL — AB (ref 70–99)
Glucose-Capillary: 103 mg/dL — ABNORMAL HIGH (ref 70–99)
Glucose-Capillary: 83 mg/dL (ref 70–99)
Glucose-Capillary: 141 mg/dL — ABNORMAL HIGH (ref 70–99)
Glucose-Capillary: 88 mg/dL (ref 70–99)
Glucose-Capillary: 96 mg/dL (ref 70–99)

## 2013-11-26 LAB — TROPONIN I: Troponin I: <0.3 ng/mL (ref <0.30)

## 2013-11-26 MED ORDER — PANTOPRAZOLE SODIUM 20 MG PO TBEC
20.0000 mg | DELAYED_RELEASE_TABLET | Freq: Every day | ORAL | Status: DC
  Administered 2013-11-27 – 2013-11-28 (×2): 20 mg via ORAL
  Filled 2013-11-26 (×2): qty 1

## 2013-11-26 MED ORDER — DOCUSATE SODIUM 50 MG/5ML PO LIQD
50.0000 mg | Freq: Every day | ORAL | Status: DC
  Administered 2013-11-26 – 2013-11-28 (×3): 50 mg via ORAL
  Filled 2013-11-26 (×3): qty 10

## 2013-11-26 MED ORDER — POLYETHYLENE GLYCOL 3350 17 G PO PACK
17.0000 g | PACK | Freq: Every day | ORAL | Status: DC
  Administered 2013-11-27 – 2013-11-28 (×2): 17 g via ORAL
  Filled 2013-11-26 (×2): qty 1

## 2013-11-26 MED ORDER — LISINOPRIL 20 MG PO TABS
20.0000 mg | ORAL_TABLET | Freq: Every day | ORAL | Status: DC
  Administered 2013-11-27 – 2013-11-28 (×2): 20 mg via ORAL
  Filled 2013-11-26 (×3): qty 1

## 2013-11-26 MED ORDER — METHADONE HCL 10 MG PO TABS
50.0000 mg | ORAL_TABLET | Freq: Every day | ORAL | Status: DC
  Administered 2013-11-26: 20 mg via ORAL
  Administered 2013-11-27 – 2013-11-28 (×2): 50 mg via ORAL
  Filled 2013-11-26 (×3): qty 5

## 2013-11-26 NOTE — Progress Notes (Signed)
Subjective and key labs:  - Pt has pain all over the body. Still does not feel significant improvement. No fever or chills. Has SOB - No BMP and CBC today - Tm=99.1 - Respiratory parasitic pathogen panel negative - Blood culture no growth so far  Objective: Vital signs in last 24 hours: Filed Vitals:   11/25/13 1735 11/25/13 1900 11/25/13 2046 11/26/13 0505  BP: 190/110 164/107 162/88 161/96  Pulse:  105 115 93  Temp:  99.1 F (37.3 C)  98.4 F (36.9 C)  TempSrc:  Oral  Oral  Resp:  22  20  Height:      Weight:      SpO2:  95% 94% 97%   Weight change:   Intake/Output Summary (Last 24 hours) at 11/26/13 1002 Last data filed at 11/26/13 0459  Gross per 24 hour  Intake    550 ml  Output    400 ml  Net    150 ml   Vitals reviewed. General: NAD, lying in bed HEENT: PERRL, EOMI Cardiac: RRR Pulm:mild bibasilar crackles  Abd: soft, nontender, nondistended, BS present Ext: warm and well perfused, no pedal edema, moving all 4 extremities Neuro: alert awake and oriented x3, strength equal in all extremities, sensation grossly intact  Lab Results: Basic Metabolic Panel:  Recent Labs Lab 11/22/13 1000  11/24/13 0716 11/25/13 0659  NA  --   < > 141 142  K  --   < > 4.2 4.5  CL  --   < > 106 109  CO2  --   < > 23 21  GLUCOSE  --   < > 78 77  BUN  --   < > 18 17  CREATININE  --   < > 0.99 0.99  CALCIUM  --   < > 8.0* 8.0*  MG 1.9  --   --   --   < > = values in this interval not displayed. Liver Function Tests:  Recent Labs Lab 11/23/13 0505 11/25/13 0659  AST 171* 121*  ALT 94* 73*  ALKPHOS 139* 112  BILITOT 1.2 0.8  PROT 7.5  7.5 7.2  ALBUMIN 2.4* 2.1*   No results found for this basename: AMMONIA,  in the last 168 hours CBC:  Recent Labs Lab 11/21/13 0942  11/24/13 0716 11/25/13 0659  WBC 7.9  < > 7.9 6.8  NEUTROABS 5.9  --  5.0  --   HGB 8.5*  < > 8.6* 9.0*  HCT 28.4*  < > 29.0* 29.8*  MCV 67.0*  < > 66.2* 65.6*  PLT 319  < > 356 318  < > =  values in this interval not displayed. Cardiac Enzymes:  Recent Labs Lab 11/25/13 1834 11/25/13 2337 11/26/13 0720  TROPONINI <0.30 <0.30 <0.30   BNP: No results found for this basename: PROBNP,  in the last 168 hours CBG:  Recent Labs Lab 11/25/13 1206 11/25/13 1659 11/25/13 2008 11/25/13 2346 11/26/13 0458 11/26/13 0751  GLUCAP 130* 94 129* 103* 100* 83   Urine Drug Screen: Drugs of Abuse     Component Value Date/Time   LABOPIA NONE DETECTED 11/22/2013 0702   COCAINSCRNUR NONE DETECTED 11/22/2013 0702   LABBENZ NONE DETECTED 11/22/2013 0702   AMPHETMU NONE DETECTED 11/22/2013 0702   THCU NONE DETECTED 11/22/2013 0702   LABBARB NONE DETECTED 11/22/2013 0702    Alcohol Level: No results found for this basename: ETH,  in the last 168 hours Urinalysis:  Recent Labs Lab 11/21/13 1236  COLORURINE AMBER*  LABSPEC 1.029  PHURINE 5.5  GLUCOSEU NEGATIVE  HGBUR NEGATIVE  BILIRUBINUR SMALL*  KETONESUR NEGATIVE  PROTEINUR NEGATIVE  UROBILINOGEN 2.0*  NITRITE NEGATIVE  LEUKOCYTESUR NEGATIVE   Medications: I have reviewed the patient's current medications. Scheduled Meds: . antiseptic oral rinse  15 mL Mouth Rinse q12n4p  . aspirin EC  81 mg Oral Daily  . chlorhexidine  15 mL Mouth Rinse BID  . folic acid  1 mg Oral Daily  . heparin  5,000 Units Subcutaneous Q8H  . hydrALAZINE  10 mg Oral TID  . lisinopril  10 mg Oral Daily  . methadone  50 mg Oral Daily  . multivitamin with minerals  1 tablet Oral Daily  . piperacillin-tazobactam (ZOSYN)  IV  3.375 g Intravenous Q8H  . sodium chloride  3 mL Intravenous Q12H   Continuous Infusions:   PRN Meds:. Assessment/Plan: Principal Problem:   CAP (community acquired pneumonia) Active Problems:   Essential hypertension, benign   Anemia   Acute encephalopathy   Generalized weakness   Adenocarcinoma, lung s/p partial lobectomy   Dysphagia, unspecified(787.20)   Chronic combined systolic and diastolic congestive heart  failure   Substance abuse   Unspecified constipation   Pleural effusion   Pulmonary nodule   Aspiration pneumonia   Lymphadenopathy Mr. Krontz is a 63 year old African American male with combined CHF, lung adenocarcinoma s/p partial lobectomy, recurrent pneumonias, and substance abuse admitted for CAP and acute encephalopathy   Aspiration PNA: In setting of esophageal dysmotility, concerning for aspiration pneumonia.  On IV Zosyn and seems to be improving slowly. His Tm=99.1. He still he feels no significant improvement. S/p thoracentesis 11/23/13. No malignant cells on cytology, culture negative. Respiratory virus pathogen panel negative. Blood culture no growth so far.  -will continue IV zosyn for now. -oxygen prn, keep o2 sats >92%  -tylenol prn fever  -duoneb q6h  -CBC BMP in AM.  Acute encephalopathy--resolved. EEG--mild generalized non-specific encephalopathy, no seizure or seizure predisposition -started lower dose of methadone to prevent withdrawal, methadone 50mg  daily for now -fall and seizure precautions   -PT/OT--CIR, family in agreement for CIR more than SNF  Iron deficiency anemia--Hb 8.5 with MCV 67 on admission, appears stable from 04/2013. Denies any hematuria or bloody BM. Not on iron supplementation at home.  -trend cbc  -recommend starting iron supplementation but will have to confirm if can be crushed based on current diet  Adenocarcinoma of lung s/p left partial lobectomy in [place] (Dr. Cydney Ok once by Dr. Julien Nordmann at [place] [facility] in May 2014 for ?recurrency and did not follow up for recurrent imaging. CT chest on admission shows b/l effusions, mild loculation on left ?multifocal PNA, 77mm nodule at LLL lobe that will need to be follow up in 1 year, mildly enlarged mediastinal nodes.  -re-image 6-12 months, cytology with no malignant cells -will need outpatient oncology and pulmonary follow up  Esophageal dysmotility--barium study done, suboptimal study,  inability to cooperate. No focal stricture or esophageal mass. Small epiphrenic diverticulum and marked esophageal statis. Esophageal dysmotility disorder.  -dys 1 diet puree, thin liquid, meds crushed with puree  Chronic combined systolic and diastolic heart failure--per echo 03/2013: EF 35-40%, diffuse hypokinesis, moderately reduced systolic function, grade 2 diastolic dysfunction, diffuse hypokinesis. Home medications include ASA 81mg  qd, hydralazine 10mg  tid, and lisinopril 10mg  qd. Weight 154 on admission-->147 on 1/28.  -continue lisinopril and hydralazine and asa  HTN--Home medications include lisinopril 10mg  qd and hydralazine 10mg  TID.  -continue  home lisinopril and hydralazine  Hx of substance abuse--hx of heroin and alcohol use, now on methadone. -HIV neg -UDS neg -methadone for now at 50mg  daily   Diet: dys 1 diet puree, thin liquid, meds crushed with puree DVT Ppx: Heparin Dispo: possibly 1-2 days, pending CIR  The patient does have a current PCP (No Pcp Per Patient) and does not need an [facility] hospital follow-up appointment after discharge. [facility] with [facility].    The patient does not have transportation limitations that hinder transportation to clinic appointments.  Services Needed at time of discharge: Y = Yes, Blank = No PT: CIR  OT:   RN:   Equipment:   Other:     LOS: 5 days   Ivor Costa, MD 11/26/2013, 10:02 AM

## 2013-11-26 NOTE — Progress Notes (Signed)
Pt reports feeling "better now". BP recheck 162/96, HR 90, RR 20 94% on 2L 02. Will notify MD. Will continue to monitor pt.

## 2013-11-26 NOTE — Progress Notes (Signed)
Internal Medicine Attending  Date: 11/26/2013  Patient name: Xzayvier Fagin Medical record number: [id number] Date of birth: [birth year] Age: 63 y.o. Gender: male  I saw and evaluated the patient and we met with family early this afternoon.  He is alert and without acute complaints; today he shows no signs of withdrawal now on methadone 50 mg daily.  We talked with patient and family and they are in agreement with plans for inpatient rehabilitation.  I agree with the plan to increase his lisinopril given his moderately elevated blood pressure.

## 2013-11-26 NOTE — Progress Notes (Signed)
Pt c/o feeling his nerves going from his "chest to his toes" pt noted to be pointing to left side of body. BP 172/117, HR 91, RR 20 temp. Scheduled Hydralazine administered, 2L o2 applied.

## 2013-11-26 NOTE — Progress Notes (Signed)
I have seen the patient and reviewed the daily progress note by medical student  and discussed the care of the patient with them. Please see my note for documentation of my findings, assessment, and plans  Ivor Costa, MD PGY3, Internal Medicine Teaching Service Pager: [phone]

## 2013-11-26 NOTE — Progress Notes (Signed)
Pt family member called the front desk to complain that the pt is calling family members and speaking about getting him out of the hospital. Was asked to remove the phone and it was placed on the front desk with his name sticker on it. Arthor Captain LPN

## 2013-11-26 NOTE — Progress Notes (Signed)
I do not have a bed to admit this pt to today in inpt rehab. I discussed with RN CM this morning and she is aware. I spoke with his daughter, Baxter Flattery, by phone and she is aware. [phone]

## 2013-11-26 NOTE — Progress Notes (Signed)
Medical Student Daily Progress Note  This is a 63 year old man with a PMH of L lung adenocarcinoma s/p partial lobectomy without chemo/radiation in 2009 and history of TB, as well as multiple lacunar strokes with mild vascular dementia, CAD, HTN, previous GI bleed, depression and anxiety, and polysubstance abuse now on methadone, admitted for acute encephalopathy and multilobar CAP, likely aspiration PNA.  Subjective: Had acute episode of chest pain radiating to L arm and leg with anxiety, hypertension, mild agitation, nausea and vomiting yesterday p.m. Consistent with opioid withdrawal. Troponins were negative and ECG was negative for ACS. Patient received a total of 50 mg of methadone yesterday with moderate relief of symptoms; after 50 mg this a.m. the patient's symptoms have largely resolved, though he still complains of some "nerves" and L hip pain. Still having dysphagia sensation and coughing episodes while eating.   Had long discussion with patient's family (daughter Mark [name] and son-in-law Mark [name]) today regarding his clinical status and plan for disposition. They agreed that CIR is the best option for him following discharge and that if a CIR bed is unavailable, a SNF is the next best option as they cannot arrange for someone to be with [name] 24 hours per day in their home.    Objective: Vital signs in last 24 hours: Filed Vitals:   11/25/13 1735 11/25/13 1900 11/25/13 2046 11/26/13 0505  BP: 190/110 164/107 162/88 161/96  Pulse:  105 115 93  Temp:  99.1 F (37.3 C)  98.4 F (36.9 C)  TempSrc:  Oral  Oral  Resp:  22  20  Height:      Weight:      SpO2:  95% 94% 97%     Filed Weights   11/23/13 0607 11/24/13 0350 11/25/13 0500  Weight: 69.5 kg (153 lb 3.5 oz) 68.7 kg (151 lb 7.3 oz) 67 kg (147 lb 11.3 oz)     Intake/Output Summary (Last 24 hours) at 11/26/13 0904 Last data filed at 11/26/13 0459  Gross per 24 hour  Intake    670 ml  Output    400 ml  Net    270 ml     Physical Exam: GENERAL: sitting up in bed, alert, in NAD.  NEURO: Alert and oriented x 3. Cooperative with exam.  HEART: RRR, crisp S1, normal S2, no murmurs, rubs, or gallops.  LUNGS: Improved air movement in upper lobes bilaterally. Crackles over both lung bases, improved from yesterday. Normal work of breathing on room air.  ABDOMEN: NABS. Soft, nontender, and nondistended.  EXTREMITIES: No pretibial edema.  Lab Results: Results for orders placed during the hospital encounter of 11/21/13 (from the past 24 hour(s))  GLUCOSE, CAPILLARY     Status: Abnormal   Collection Time    11/25/13 12:06 PM      Result Value Range   Glucose-Capillary 130 (*) 70 - 99 mg/dL  GLUCOSE, CAPILLARY     Status: None   Collection Time    11/25/13  4:59 PM      Result Value Range   Glucose-Capillary 94  70 - 99 mg/dL  TROPONIN I     Status: None   Collection Time    11/25/13  6:34 PM      Result Value Range   Troponin I <0.30  <0.30 ng/mL  GLUCOSE, CAPILLARY     Status: Abnormal   Collection Time    11/25/13  8:08 PM      Result Value Range   Glucose-Capillary 129 (*)  70 - 99 mg/dL   Comment 1 Notify RN     Comment 2 Documented in Chart    TROPONIN I     Status: None   Collection Time    11/25/13 11:37 PM      Result Value Range   Troponin I <0.30  <0.30 ng/mL  GLUCOSE, CAPILLARY     Status: Abnormal   Collection Time    11/25/13 11:46 PM      Result Value Range   Glucose-Capillary 103 (*) 70 - 99 mg/dL   Comment 1 Notify RN     Comment 2 Documented in Chart    GLUCOSE, CAPILLARY     Status: Abnormal   Collection Time    11/26/13  4:58 AM      Result Value Range   Glucose-Capillary 100 (*) 70 - 99 mg/dL  TROPONIN I     Status: None   Collection Time    11/26/13  7:20 AM      Result Value Range   Troponin I <0.30  <0.30 ng/mL  GLUCOSE, CAPILLARY     Status: None   Collection Time    11/26/13  7:51 AM      Result Value Range   Glucose-Capillary 83  70 - 99 mg/dL   Comment 1  Documented in Chart     Comment 2 Notify RN      Micro Results: Recent Results (from the past 240 hour(s))  CULTURE, BLOOD (ROUTINE X 2)     Status: None   Collection Time    11/21/13 11:45 AM      Result Value Range Status   Specimen Description BLOOD   Final   Special Requests     Final   Value: BOTTLES DRAWN AEROBIC AND ANAEROBIC 5C FROM RIGHT EJ   Culture  Setup Time     Final   Value: 11/21/2013 18:05     Performed at [facility]   Culture     Final   Value:        BLOOD CULTURE RECEIVED NO GROWTH TO DATE CULTURE WILL BE HELD FOR 5 DAYS BEFORE ISSUING A FINAL NEGATIVE REPORT     Performed at [facility]   Report Status PENDING   Incomplete  CULTURE, BLOOD (ROUTINE X 2)     Status: None   Collection Time    11/21/13 11:45 AM      Result Value Range Status   Specimen Description BLOOD   Final   Special Requests BOTTLES DRAWN AEROBIC ONLY 5CC FROM RIGHT EJ   Final   Culture  Setup Time     Final   Value: 11/21/2013 18:05     Performed at [facility]   Culture     Final   Value:        BLOOD CULTURE RECEIVED NO GROWTH TO DATE CULTURE WILL BE HELD FOR 5 DAYS BEFORE ISSUING A FINAL NEGATIVE REPORT     Performed at [facility]   Report Status PENDING   Incomplete  RESPIRATORY VIRUS PANEL     Status: None   Collection Time    11/21/13  3:57 PM      Result Value Range Status   Source - [facility] NASAL SWAB   Corrected   Comment: CORRECTED ON 01/26 AT 1951: PREVIOUSLY REPORTED AS NASAL SWAB   Respiratory Syncytial Virus A NOT DETECTED   Final   Respiratory Syncytial Virus B NOT DETECTED   Final  Influenza A NOT DETECTED   Final   Influenza B NOT DETECTED   Final   Parainfluenza 1 NOT DETECTED   Final   Parainfluenza 2 NOT DETECTED   Final   Parainfluenza 3 NOT DETECTED   Final   Metapneumovirus NOT DETECTED   Final   Rhinovirus NOT DETECTED   Final   Adenovirus NOT DETECTED   Final   Influenza A H1 NOT DETECTED   Final   Influenza A H3 NOT  DETECTED   Final   Comment: (NOTE)           Normal Reference Range for each Analyte: NOT DETECTED     Testing performed using the Lumine[employer] xTAG Respiratory Viral Panel test     kit.     This test was developed and its performance characteristics determined     by [facility]. It has not been cleared or approved by the [place]     Food and Drug Administration. This test is used for clinical purposes.     It should not be regarded as investigational or for research. This     laboratory is certified under the [place] (CLIA) as qualified to perform high complexity     clinical laboratory testing.     Performed at [facility]     Status: None   Collection Time    11/23/13  9:48 AM      Result Value Range Status   Specimen Description PLEURAL FLUID RIGHT   Final   Special Requests 60ML FLUID   Final   Fungal Smear     Final   Value: NO YEAST OR FUNGAL ELEMENTS SEEN     Performed at [facility]   Culture     Final   Value: CULTURE IN PROGRESS FOR FOUR WEEKS     Performed at [facility]   Report Status PENDING   Incomplete  BODY FLUID CULTURE     Status: None   Collection Time    11/23/13  9:48 AM      Result Value Range Status   Specimen Description PLEURAL FLUID RIGHT   Final   Special Requests 60 ML FLUID   Final   Gram Stain     Final   Value: MODERATE WBC PRESENT,BOTH PMN AND MONONUCLEAR     NO ORGANISMS SEEN     Performed at [facility]   Culture     Final   Value: NO GROWTH 2 DAYS     Performed at [facility]   Report Status PENDING   Incomplete  AFB CULTURE WITH SMEAR     Status: None   Collection Time    11/23/13  9:48 AM      Result Value Range Status   Specimen Description FLUID PLEURAL RIGHT   Final   Special Requests 60ML FLUID   Final   ACID FAST SMEAR     Final   Value: NO ACID FAST BACILLI SEEN     Performed at [facility]   Culture      Final   Value: CULTURE WILL BE EXAMINED FOR 6 WEEKS BEFORE ISSUING A FINAL REPORT     Performed at [facility]   Report Status PENDING   Incomplete    Studies/Results: No results found.  Medications:  Scheduled Meds: . antiseptic oral rinse  15 mL Mouth Rinse q12n4p  .  aspirin EC  81 mg Oral Daily  . chlorhexidine  15 mL Mouth Rinse BID  . folic acid  1 mg Oral Daily  . heparin  5,000 Units Subcutaneous Q8H  . hydrALAZINE  10 mg Oral TID  . lisinopril  10 mg Oral Daily  . methadone  30 mg Oral Daily  . multivitamin with minerals  1 tablet Oral Daily  . piperacillin-tazobactam (ZOSYN)  IV  3.375 g Intravenous Q8H  . sodium chloride  3 mL Intravenous Q12H   Continuous Infusions:  PRN Meds:.ipratropium-albuterol, promethazine  Assessment/Plan:  Acute Encephalopathy. Most likely delirium superimposed on baseline mild dementia, see previous notes for further discussion of ddx. Currently resolved, patient lucid.  - Methadone at 50 mg/day. If patient becomes somnolent again, will hold x 1 day and decrease to 45 mg daily.  - re-orientation as needed  - minimize psychoactive meds  - PT/OT following, CIR evaluating.  - fall precautions   Cough/SOB: Likely aspiration pneumonia; Zosyn to cover for anaerobes given aspiration risk. Cytology neg for malignancy. Pulmonary consulted and recommended tx for aspiration PNA, aspiration precautions, imaging follow up in 6 mos to one year.  - Zosyn 3.35g IV q8; plan to transition to Augmentin 875 mg BID on day of discharge. Today is day 6/10 of antibiotic therapy.  - appreciate pulmonology recommendations. Will need pulm follow up. - Duonebs Q6h   Dysphagia/Odynophagia: CT showed residual fluid in esophagus; barium swallow suggestive of dysmotility or with epiphrenic diverticulum. GI consulted, recommend against endoscopy, recommended no further workup. Episode of choking/vomiting on food today, no new shortness of breath or O2  requirement.  - dysphagia 1 diet (puree, with crushed meds)   CHF/CAD/HTN: currently stable. Patient still looks clinically euvolemic to slightly dry. Will defer diuresis for now given adequate oxygenation, no LE edema.  - aspirin 81 mg daily  - hydralazine 10 mg TID  - lisinopril 10 mg daily  - telemetry   Anemia: Hgb stable at 8.2 today. MCV 67 with polychromasia on smear, strongly suggestive of iron deficiency anemia.  - Multivitamin  Transaminitis with small volume ascites on chest CT in setting of HCV+ patient. LFTs downtrending. 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  - daily CMET   FEN/GI:  - SLP following  - Dysphagia 1 diet  - IV fluids discontinued  - soap suds enemas PRN constipation   PPx:  - SQH  - ambulation with assistance when mental status improves  - aspiration precautions    LOS: 5 days   This is a Careers information officer Note.  The care of the patient was discussed with Dr. Blaine Hamper and the assessment and plan formulated with their assistance.  Please see their attached note for official documentation of the daily encounter.  Jeniece Hannis 11/26/2013, 9:04 AM

## 2013-11-26 NOTE — Progress Notes (Signed)
Pt BP elevated at 162/102 after giving PO hydralazine an hour ago. On call MD notified, no new orders given.

## 2013-11-26 NOTE — Progress Notes (Signed)
PT Cancellation Note  Patient Details Name: Mark [name] MRN: [id number] DOB: [birth year]   Cancelled Treatment:    Reason Eval/Treat Not Completed: Medical issues which prohibited therapy (High BP 172/117 and anxiety.)   Cylus Douville 11/26/2013, 3:03 PM

## 2013-11-27 DIAGNOSIS — K224 Dyskinesia of esophagus: Secondary | ICD-10-CM | POA: Diagnosis present

## 2013-11-27 DIAGNOSIS — Z85118 Personal history of other malignant neoplasm of bronchus and lung: Secondary | ICD-10-CM

## 2013-11-27 DIAGNOSIS — R9431 Abnormal electrocardiogram [ECG] [EKG]: Secondary | ICD-10-CM | POA: Diagnosis present

## 2013-11-27 LAB — CBC
HEMATOCRIT: 31.6 % — AB (ref 39.0–52.0)
HEMOGLOBIN: 9.5 g/dL — AB (ref 13.0–17.0)
MCH: 19.6 pg — ABNORMAL LOW (ref 26.0–34.0)
MCHC: 30.1 g/dL (ref 30.0–36.0)
MCV: 65.3 fL — ABNORMAL LOW (ref 78.0–100.0)
Platelets: 409 10*3/uL — ABNORMAL HIGH (ref 150–400)
RBC: 4.84 MIL/uL (ref 4.22–5.81)
RDW: 18.3 % — ABNORMAL HIGH (ref 11.5–15.5)
WBC: 9 10*3/uL (ref 4.0–10.5)

## 2013-11-27 LAB — CULTURE, BLOOD (ROUTINE X 2)
CULTURE: NO GROWTH
Culture: NO GROWTH

## 2013-11-27 LAB — GLUCOSE, CAPILLARY
GLUCOSE-CAPILLARY: 97 mg/dL (ref 70–99)
GLUCOSE-CAPILLARY: 82 mg/dL (ref 70–99)
GLUCOSE-CAPILLARY: 117 mg/dL — AB (ref 70–99)
GLUCOSE-CAPILLARY: 100 mg/dL — AB (ref 70–99)
Glucose-Capillary: 100 mg/dL — ABNORMAL HIGH (ref 70–99)
Glucose-Capillary: 100 mg/dL — ABNORMAL HIGH (ref 70–99)
Glucose-Capillary: 87 mg/dL (ref 70–99)

## 2013-11-27 LAB — COMPREHENSIVE METABOLIC PANEL
ALK PHOS: 110 U/L (ref 39–117)
ALT: 55 U/L — ABNORMAL HIGH (ref 0–53)
AST: 69 U/L — ABNORMAL HIGH (ref 0–37)
Albumin: 2.4 g/dL — ABNORMAL LOW (ref 3.5–5.2)
BUN: 21 mg/dL (ref 6–23)
CO2: 22 meq/L (ref 19–32)
Calcium: 8.6 mg/dL (ref 8.4–10.5)
Chloride: 104 mEq/L (ref 96–112)
Creatinine, Ser: 1.02 mg/dL (ref 0.50–1.35)
GFR calc non Af Amer: 76 mL/min — ABNORMAL LOW (ref >90)
GFR, EST AFRICAN AMERICAN: 88 mL/min — AB (ref >90)
GLUCOSE: 87 mg/dL (ref 70–99)
POTASSIUM: 4.7 meq/L (ref 3.7–5.3)
SODIUM: 143 meq/L (ref 137–147)
TOTAL PROTEIN: 8 g/dL (ref 6.0–8.3)
Total Bilirubin: 1.2 mg/dL (ref 0.3–1.2)

## 2013-11-27 MED ORDER — AMOXICILLIN-POT CLAVULANATE 875-125 MG PO TABS: 1.0000 | ORAL_TABLET | Freq: Two times a day (BID) | ORAL | Status: AC

## 2013-11-27 MED ORDER — DOCUSATE SODIUM 50 MG/5ML PO LIQD: 50.0000 mg | Freq: Every day | ORAL | Status: AC

## 2013-11-27 MED ORDER — LISINOPRIL 20 MG PO TABS: 20.0000 mg | ORAL_TABLET | Freq: Every day | ORAL | Status: DC

## 2013-11-27 MED ORDER — LISINOPRIL 20 MG PO TABS: 10.0000 mg | ORAL_TABLET | Freq: Every day | ORAL | Status: DC

## 2013-11-27 MED ORDER — METHADONE HCL 10 MG PO TABS: 50.0000 mg | ORAL_TABLET | Freq: Every day | ORAL | Status: DC

## 2013-11-27 MED ORDER — AMOXICILLIN-POT CLAVULANATE 875-125 MG PO TABS
1.0000 | ORAL_TABLET | Freq: Two times a day (BID) | ORAL | Status: DC
  Administered 2013-11-27 – 2013-11-28 (×3): 1 via ORAL
  Filled 2013-11-27 (×4): qty 1

## 2013-11-27 MED ORDER — PANTOPRAZOLE SODIUM 20 MG PO TBEC: 20.0000 mg | DELAYED_RELEASE_TABLET | Freq: Every day | ORAL | Status: DC

## 2013-11-27 MED ORDER — PROMETHAZINE HCL 25 MG/ML IJ SOLN: 6.2500 mg | Freq: Once | INTRAMUSCULAR | Status: DC

## 2013-11-27 MED ORDER — ADULT MULTIVITAMIN W/MINERALS CH: 1.0000 | ORAL_TABLET | Freq: Every day | ORAL | Status: AC

## 2013-11-27 MED ORDER — AMOXICILLIN-POT CLAVULANATE 875-125 MG PO TABS
1.0000 | ORAL_TABLET | Freq: Three times a day (TID) | ORAL | Status: DC
  Filled 2013-11-27 (×2): qty 1

## 2013-11-27 MED ORDER — PROMETHAZINE HCL 12.5 MG PO TABS
12.5000 mg | ORAL_TABLET | Freq: Once | ORAL | Status: AC
  Administered 2013-11-27: 12.5 mg via ORAL
  Filled 2013-11-27 (×2): qty 1

## 2013-11-27 NOTE — Discharge Instructions (Signed)
Dear Mr. Umbaugh, thank you for letting us take care you this admission.   Your methadone dose has been decreased to 50mg  daily  Please complete 4 more days of Augmentin antibiotics twice a day for total of 10 day course as prescribed  Please follow a dysphagia 1 puree diet with thin liquids and have your medications crushed and have staff to assist with feeding while sitting upright 90 degrees and for at least 1 hour after a meal with small sips and bites and eating slow.    Please follow up with oncology, Dr. Julien Nordmann for your lung cancer history and you will need repeat cxr in 4-6 weeks at least and will also need follow CT scan of your lungs in 1 year for the new left lung nodule.    Your blood pressure medications have been changed to lisinopril 20mg  daily and continue hydralazine 10mg  three times a day  [facility], please do not miss your appointment and follow up accordingly  Please continue to work with PT  Dysphagia Swallowing problems (dysphagia) occur when solids and liquids seem to stick in your throat on the way down to your stomach, or the food takes longer to get to the stomach. Other symptoms include regurgitating food, noises coming from the throat, chest discomfort with swallowing, and a feeling of fullness or the feeling of something being stuck in your throat when swallowing. When blockage in your throat is complete it may be associated with drooling. CAUSES  Problems with swallowing may occur because of problems with the muscles. The food cannot be propelled in the usual manner into your stomach. You may have ulcers, scar tissue, or inflammation in the tube down which food travels from your mouth to your stomach (esophagus), which blocks food from passing normally into the stomach. Causes of inflammation include:  Acid reflux from your stomach into your esophagus.  Infection.  Radiation treatment for  cancer.  Medicines taken without enough fluids to wash them down into your stomach. You may have nerve problems that prevent signals from being sent to the muscles of your esophagus to contract and move your food down to your stomach. Globus pharyngeus is a relatively common problem in which there is a sense of an obstruction or difficulty in swallowing, without any physical abnormalities of the swallowing passages being found. This problem usually improves over time with reassurance and testing to rule out other causes. DIAGNOSIS Dysphagia can be diagnosed and its cause can be determined by tests in which you swallow a white substance that helps illuminate the inside of your throat (contrast medium) while X-rays are taken. Sometimes a flexible telescope that is inserted down your throat (endoscopy) to look at your esophagus and stomach is used. TREATMENT   If the dysphagia is caused by acid reflux or infection, medicines may be used.  If the dysphagia is caused by problems with your swallowing muscles, swallowing therapy may be used to help you strengthen your swallowing muscles.  If the dysphagia is caused by a blockage or mass, procedures to remove the blockage may be done. HOME CARE INSTRUCTIONS  Try to eat soft food that is easier to swallow and check your weight on a daily basis to be sure that it is not decreasing.  Be sure to drink liquids when sitting upright (not lying down). SEEK MEDICAL CARE IF:  You are losing weight because you are unable to swallow.  You are  coughing when you drink liquids (aspiration).  You are coughing up partially digested food. SEEK IMMEDIATE MEDICAL CARE IF:  You are unable to swallow your own saliva .  You are having shortness of breath or a fever, or both.  You have a hoarse voice along with difficulty swallowing. MAKE SURE YOU:  Understand these instructions.  Will watch your condition.  Will get help right away if you are not doing well  or get worse. Document Released: 10/12/2000 Document Revised: 06/17/2013 Document Reviewed: 04/03/2013 [facility] Patient Information 2014 [place].  Aspiration Pneumonia Aspiration pneumonia is an infection in your lungs. It occurs when food, liquid, or stomach contents (vomit) are inhaled (aspirated) into your lungs. When these things get into your lungs, swelling (inflammation) and infection can occur. This can make it difficult for you to breathe. Aspiration pneumonia is a serious condition and can be life threatening. RISK FACTORS Aspiration pneumonia is more likely to occur when a person's cough (gag) reflex or ability to swallow has been decreased. Some things that can do this include:   Having a brain injury or disease, such as stroke, seizures, Parkinson disease, dementia, or amyotrophic lateral sclerosis (ALS).   Being given general anesthetic for procedures.   Being in a coma (unconscious).   Having a narrowing of the tube that carries food to the stomach (esophagus).   Drinking too much alcohol. If a person passes out and vomits, vomit can be swallowed into the lungs.   Taking certain medicines, such as tranquilizers or sedatives.  SIGNS AND SYMPTOMS   Coughing after swallowing food or liquids.   Breathing problems, such as wheezing or shortness of breath.   Bluish skin. This can be caused by lack of oxygen.   Coughing up food or mucus. The mucus might contain blood, greenish material, or yellowish-white fluid (pus).   Fever.   Chest pain.   Being more tired than usual (fatigue).   Sweating more than usual.   Bad breath.  DIAGNOSIS  A physical exam will be done. During the exam, the health care provider will listen to your lungs with a stethoscope to check for:   Crackling sounds in the lungs.  Decreased breath sounds.  A rapid heartbeat. Various tests may be ordered. These may include:   Chest X-ray.   CT scan.   Swallowing  study. This test looks at how food is swallowed and whether it goes into your breathing tube (trachea) or food pipe (esophagus).   Sputum culture. Saliva and mucus (sputum) are collected from the lungs or the tubes that carry air to the lungs (bronchi). The sputum is then tested for bacteria.   Bronchoscopy. This test uses a flexible tube (bronchoscope) to see inside the lungs. TREATMENT  Treatment will usually include antibiotic medicines. Other medicines may also be used to reduce fever or pain. You may need to be treated in the hospital. In the hospital, your breathing will be carefully monitored. Depending on how well you are breathing, you may need to be given oxygen, or you may need breathing support from a breathing machine (ventilator). For people who fail a swallowing study, a feeding tube might be placed in the stomach, or they may be asked to avoid certain food textures or liquids when they eat. HOME CARE INSTRUCTIONS   Carefully follow any special eating instructions you were given, such as avoiding certain food textures or thickening liquids. This reduces the risk of developing aspiration pneumonia again.  Only take over-the-counter or prescription  medicines as directed by your health care provider. Follow the directions carefully.   If you were prescribed antibiotics, take them as directed. Finish them even if you start to feel better.   Rest as instructed by your health care provider.   Keep all follow-up appointments with your health care provider.  SEEK MEDICAL CARE IF:   You develop worsening shortness of breath, wheezing, or difficulty breathing.   You develop a fever.   You have chest pain.  MAKE SURE YOU:   Understand these instructions.  Will watch your condition.  Will get help right away if you are not doing well or get worse. Document Released: 08/12/2009 Document Revised: 06/17/2013 Document Reviewed: 04/02/2013 [facility] Patient Information 2014  [place].

## 2013-11-27 NOTE — Progress Notes (Signed)
Pt's IV leaking. MD notified about request to start new IV access and told to wait for new IV start order if pt will be staying tonight.

## 2013-11-27 NOTE — Clinical Social Work Placement (Deleted)
Clinical Social Work Department CLINICAL SOCIAL WORK PLACEMENT NOTE 11/27/2013  Patient:  Mark [name], Mark [name]  Account Number:  [id number] Admit date:  11/21/2013  Clinical Social Worker:  Kemper Durie, [place]  Date/time:  11/24/2013 01:00 PM  Clinical Social Work is seeking post-discharge placement for this patient at the following level of care:   Between   (*CSW will update this form in Epic as items are completed)   11/24/2013  Patient/family provided with [place] Department of Clinical Social Work's list of facilities offering this level of care within the geographic area requested by the patient (or if unable, by the patient's family).  11/24/2013  Patient/family informed of their freedom to choose among providers that offer the needed level of care, that participate in Medicare, Medicaid or managed care program needed by the patient, have an available bed and are willing to accept the patient.  11/24/2013  Patient/family informed of [facility]' ownership interest in [facility], as well as of the fact that they are under no obligation to receive care at this facility.  PASARR submitted to EDS on 11/24/2013 PASARR number received from EDS on 11/24/2013  FL2 transmitted to all facilities in geographic area requested by pt/family on  11/24/2013 FL2 transmitted to all facilities within larger geographic area on 11/24/2013  Patient informed that his/her managed care company has contracts with or will negotiate with  certain facilities, including the following:     Patient/family informed of bed offers received:  11/25/2013 Patient chooses bed at OTHER Physician recommends and patient chooses bed at    Patient to be transferred to [facility] on  11/28/2013 Patient to be transferred to facility by Ambulance  The following physician request were entered in Epic:   Additional Comments: Patient will DC to [facility] on 11/28/13. Daughter has  been updated. Detailed report left for weekend CSW. Attending will write prescription for 2 day supply of patient's methadone until facility's MD can see patient. Patient must be transported by [facility].   Liz Beach, [place], [place], [phone]

## 2013-11-27 NOTE — Clinical Social Work Note (Signed)
Facility states they need patient's DC summary by 1:00PM. MD notified.  Liz Beach, [place], [place], [phone]

## 2013-11-27 NOTE — Progress Notes (Signed)
Internal Medicine Attending  Date: 11/27/2013  Patient name: Mark [name] Medical record number: [id number] Date of birth: [birth year] Age: 63 y.o. Gender: male  I saw and evaluated the patient, and discussed his care on A.M rounds with housestaff.  Discharge plans today were canceled because the receiving skilled nursing facility informed social worker that the physician at the SNF would not prescribe methadone maintenance for the patient after he is admitted there.  Dr. Reynaldo Minium thought they were asking only for a prescription to cover a two-day bridge until patient can be seen by physician there, but we discussed with our social worker who said that the SNF physician would not prescribe it at all.  Patient needs 24 hour assistance/supervision, and is not safe for discharge home alone.   Dr. Daryll Drown will cover as attending physician this weekend, and Dr. Eppie Gibson will take over as attending physician on Monday 11/30/2013.

## 2013-11-27 NOTE — Progress Notes (Signed)
Medical Student Daily Progress Note  This is a 63 year old man with a PMH of L lung adenocarcinoma s/p partial lobectomy without chemo/radiation in 2009 and history of TB, as well as multiple lacunar strokes with mild vascular dementia, CAD, HTN, previous GI bleed, depression and anxiety, and polysubstance abuse now on methadone, admitted for acute encephalopathy and multilobar CAP, likely aspiration PNA.  Subjective: Doing well today. Sitting up in chair eating breakfast. Got methadone 2 hours early today 2/2 withdrawal symptoms, elev BP. Says he is feeling well and happy with his current dose of methadone. No chest or abdominal pain. BM x 1 this morning.    Objective: Vital signs in last 24 hours: Filed Vitals:   11/27/13 0807 11/27/13 0942 11/27/13 1032 11/27/13 1226  BP: 170/110 158/100  146/91  Pulse:   97 91  Temp:    97.4 F (36.3 C)  TempSrc:    Oral  Resp:    18  Height:      Weight:      SpO2:   100% 92%   Filed Weights   11/24/13 0350 11/25/13 0500 11/27/13 0358  Weight: 68.7 kg (151 lb 7.3 oz) 67 kg (147 lb 11.3 oz) 66.8 kg (147 lb 4.3 oz)      Intake/Output Summary (Last 24 hours) at 11/27/13 1319 Last data filed at 11/27/13 1000  Gross per 24 hour  Intake    180 ml  Output    600 ml  Net   -420 ml    Physical Exam: GENERAL: sitting up in chair, alert, in NAD.  NEURO: Alert and oriented x 3. Cooperative with exam.  HEART: RRR, crisp S1, normal S2, no murmurs, rubs, or gallops.  LUNGS: Good air movement in upper lobes bilaterally. Some crackles persist at lung bases. Normal work of breathing on room air.  ABDOMEN: NABS. Soft, nontender, and nondistended.  EXTREMITIES: No pretibial edema.  Lab Results: Results for orders placed during the hospital encounter of 11/21/13 (from the past 24 hour(s))  GLUCOSE, CAPILLARY     Status: None   Collection Time    11/26/13  5:20 PM      Result Value Range   Glucose-Capillary 88  70 - 99 mg/dL   Comment 1 Documented  in Chart     Comment 2 Notify RN    GLUCOSE, CAPILLARY     Status: None   Collection Time    11/26/13  7:38 PM      Result Value Range   Glucose-Capillary 96  70 - 99 mg/dL  GLUCOSE, CAPILLARY     Status: Abnormal   Collection Time    11/27/13 12:22 AM      Result Value Range   Glucose-Capillary 100 (*) 70 - 99 mg/dL  GLUCOSE, CAPILLARY     Status: None   Collection Time    11/27/13  3:56 AM      Result Value Range   Glucose-Capillary 97  70 - 99 mg/dL  CBC     Status: Abnormal   Collection Time    11/27/13  6:25 AM      Result Value Range   WBC 9.0  4.0 - 10.5 K/uL   RBC 4.84  4.22 - 5.81 MIL/uL   Hemoglobin 9.5 (*) 13.0 - 17.0 g/dL   HCT 31.6 (*) 39.0 - 52.0 %   MCV 65.3 (*) 78.0 - 100.0 fL   MCH 19.6 (*) 26.0 - 34.0 pg   MCHC 30.1  30.0 - 36.0  g/dL   RDW 18.3 (*) 11.5 - 15.5 %   Platelets 409 (*) 150 - 400 K/uL  COMPREHENSIVE METABOLIC PANEL     Status: Abnormal   Collection Time    11/27/13  6:25 AM      Result Value Range   Sodium 143  137 - 147 mEq/L   Potassium 4.7  3.7 - 5.3 mEq/L   Chloride 104  96 - 112 mEq/L   CO2 22  19 - 32 mEq/L   Glucose, Bld 87  70 - 99 mg/dL   BUN 21  6 - 23 mg/dL   Creatinine, Ser 1.02  0.50 - 1.35 mg/dL   Calcium 8.6  8.4 - 10.5 mg/dL   Total Protein 8.0  6.0 - 8.3 g/dL   Albumin 2.4 (*) 3.5 - 5.2 g/dL   AST 69 (*) 0 - 37 U/L   ALT 55 (*) 0 - 53 U/L   Alkaline Phosphatase 110  39 - 117 U/L   Total Bilirubin 1.2  0.3 - 1.2 mg/dL   GFR calc non Af Amer 76 (*) >90 mL/min   GFR calc Af Amer 88 (*) >90 mL/min  GLUCOSE, CAPILLARY     Status: None   Collection Time    11/27/13  7:51 AM      Result Value Range   Glucose-Capillary 82  70 - 99 mg/dL  GLUCOSE, CAPILLARY     Status: Abnormal   Collection Time    11/27/13 12:24 PM      Result Value Range   Glucose-Capillary 117 (*) 70 - 99 mg/dL    Micro Results: Recent Results (from the past 240 hour(s))  CULTURE, BLOOD (ROUTINE X 2)     Status: None   Collection Time     11/21/13 11:45 AM      Result Value Range Status   Specimen Description BLOOD   Final   Special Requests     Final   Value: BOTTLES DRAWN AEROBIC AND ANAEROBIC 5C FROM RIGHT EJ   Culture  Setup Time     Final   Value: 11/21/2013 18:05     Performed at [place]     Final   Value: NO GROWTH 5 DAYS     Performed at [facility]   Report Status 11/27/2013 FINAL   Final  CULTURE, BLOOD (ROUTINE X 2)     Status: None   Collection Time    11/21/13 11:45 AM      Result Value Range Status   Specimen Description BLOOD   Final   Special Requests BOTTLES DRAWN AEROBIC ONLY 5CC FROM RIGHT EJ   Final   Culture  Setup Time     Final   Value: 11/21/2013 18:05     Performed at [place]     Final   Value: NO GROWTH 5 DAYS     Performed at [facility]   Report Status 11/27/2013 FINAL   Final  RESPIRATORY VIRUS PANEL     Status: None   Collection Time    11/21/13  3:57 PM      Result Value Range Status   Source - RVPAN NASAL SWAB   Corrected   Comment: CORRECTED ON 01/26 AT 1951: PREVIOUSLY REPORTED AS NASAL SWAB   Respiratory Syncytial Virus A NOT DETECTED   Final   Respiratory Syncytial Virus B NOT DETECTED   Final   Influenza A NOT DETECTED   Final  Influenza B NOT DETECTED   Final   Parainfluenza 1 NOT DETECTED   Final   Parainfluenza 2 NOT DETECTED   Final   Parainfluenza 3 NOT DETECTED   Final   Metapneumovirus NOT DETECTED   Final   Rhinovirus NOT DETECTED   Final   Adenovirus NOT DETECTED   Final   Influenza A H1 NOT DETECTED   Final   Influenza A H3 NOT DETECTED   Final   Comment: (NOTE)           Normal Reference Range for each Analyte: NOT DETECTED     Testing performed using the Luminex xTAG Respiratory Viral Panel test     kit.     This test was developed and its performance characteristics determined     by [facility]. It has not been cleared or approved by the [place]     Food and Drug Administration. This  test is used for clinical purposes.     It should not be regarded as investigational or for research. This     laboratory is certified under the [place] (CLIA) as qualified to perform high complexity     clinical laboratory testing.     Performed at [place]     Status: None   Collection Time    11/23/13  9:48 AM      Result Value Range Status   Specimen Description PLEURAL FLUID RIGHT   Final   Special Requests 60ML FLUID   Final   Fungal Smear     Final   Value: NO YEAST OR FUNGAL ELEMENTS SEEN     Performed at [facility]   Culture     Final   Value: CULTURE IN PROGRESS FOR FOUR WEEKS     Performed at [facility]   Report Status PENDING   Incomplete  BODY FLUID CULTURE     Status: None   Collection Time    11/23/13  9:48 AM      Result Value Range Status   Specimen Description PLEURAL FLUID RIGHT   Final   Special Requests 60 ML FLUID   Final   Gram Stain     Final   Value: MODERATE WBC PRESENT,BOTH PMN AND MONONUCLEAR     NO ORGANISMS SEEN     Performed at [facility]   Culture     Final   Value: NO GROWTH 3 DAYS     Performed at [facility]   Report Status 11/26/2013 FINAL   Final  AFB CULTURE WITH SMEAR     Status: None   Collection Time    11/23/13  9:48 AM      Result Value Range Status   Specimen Description FLUID PLEURAL RIGHT   Final   Special Requests 60ML FLUID   Final   ACID FAST SMEAR     Final   Value: NO ACID FAST BACILLI SEEN     Performed at [facility]   Culture     Final   Value: CULTURE WILL BE EXAMINED FOR 6 WEEKS BEFORE ISSUING A FINAL REPORT     Performed at [facility]   Report Status PENDING   Incomplete    Studies/Results: No results found.  Medications:  Scheduled Meds: . amoxicillin-clavulanate  1 tablet Oral TID  . antiseptic oral rinse  15 mL Mouth Rinse q12n4p  .  aspirin EC  81 mg Oral Daily  .  chlorhexidine  15 mL Mouth Rinse BID  . docusate  50 mg Oral Daily  . folic acid  1 mg Oral Daily  . heparin  5,000 Units Subcutaneous Q8H  . hydrALAZINE  10 mg Oral TID  . lisinopril  20 mg Oral Daily  . methadone  50 mg Oral Daily  . multivitamin with minerals  1 tablet Oral Daily  . pantoprazole  20 mg Oral Daily  . polyethylene glycol  17 g Oral Daily  . sodium chloride  3 mL Intravenous Q12H   Continuous Infusions:  PRN Meds:.ipratropium-albuterol, promethazine  Assessment/Plan:  Acute Encephalopathy. Most likely delirium superimposed on baseline mild dementia, see previous notes for further discussion of ddx. Currently resolved, patient lucid.  - Methadone at 50 mg/day. May need to be increased slightly, perhaps to 60 mg, in SNF or outpatient setting. - re-orientation as needed  - minimize psychoactive meds  - PT/OT following, plan to go to SNF in [place] today. - fall precautions   Cough/SOB: Likely aspiration pneumonia. Cytology neg for malignancy. Pulmonary consulted and recommended tx for aspiration PNA, aspiration precautions, imaging follow up in 6 mos to one year.  - Augmentin 875 mg BID begun today. Today is day 7/10 of antibiotic therapy.  - Pulmonology follow up arranged. - Oncology follow up arranged given   Dysphagia/Odynophagia: CT showed residual fluid in esophagus; barium swallow suggestive of dysmotility or with epiphrenic diverticulum. GI consulted, recommend against endoscopy, recommended no further workup. Episode of choking/vomiting on food today, no new shortness of breath or O2 requirement.  - dysphagia 1 diet (puree, with crushed meds) - family advised about appropriate diet for SNF/home   CHF/CAD/HTN: currently stable.  - aspirin 81 mg daily  - hydralazine 10 mg TID  - lisinopril increased 20 mg daily  - telemetry   Anemia: Hgb stable at 8.2 today. MCV 67 with polychromasia on smear, strongly suggestive of iron deficiency anemia.  - Multivitamin   - oral iron deferred 2/2 concern for GI irritation in setting of esophageal dysmotility and fluid pooling  Transaminitis with small volume ascites on chest CT in setting of HCV+ patient. LFTs continue to downtrend. 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 Possible that methadone toxicity in setting of systemic illness, continued alcohol use, and methadone dose increase may have contributed. - recommend attention to LFTs at follow-up  FEN/GI:  - SLP following  - Dysphagia 1 diet  - IV fluids discontinued  - soap suds enemas PRN constipation   PPx:  - SQH  - ambulation with assistance when mental status improves  - aspiration precautions  Dispo: to SNF in [place] today.   LOS: 6 days   This is a Careers information officer Note.  The care of the patient was discussed with Dr. Eula Fried and the assessment and plan formulated with their assistance.  Please see their attached note for official documentation of the daily encounter.  Mark [name] 11/27/2013, 1:19 PM

## 2013-11-27 NOTE — Progress Notes (Signed)
I met with pt, his daughter, and son in law at bedside. Dr. Naaman Plummer came and assessed pt again for the appropriateness of admission to inpt rehab due to the issues around admitting to [facility] and methadone script. Pt's family can not provide 24/7 supervision which he will need after any rehab stay. Pt therefore not appropriate to admit to inpt rehab today for pt will still need 24/7 supervision and pt would be SNF rehab with same methadone issues for placement. Burden of care for admission to our inpt rehab can not be justified at this time. Please call me with any questions. I have discussed with RN CM,  SW and [facility] . [phone]

## 2013-11-27 NOTE — Progress Notes (Signed)
Pt had episode of emesis. Lungs sound clear. PT still feels nauseas. MD notified.

## 2013-11-27 NOTE — Progress Notes (Signed)
I do not have a bed available for this pt today. I have discussed with RN CM. Recommend SNF at this time. Also discussed with therapy. [phone]

## 2013-11-27 NOTE — Progress Notes (Signed)
[facility]  CONSULT SERVICE NOTE  Pt still with confusion and sundowning. In reviewing the chart and history there is likely an element of dementia along with any new encephalopathy taking place. He has scattered SVD on head CT. He cannot go to SNF as the facility is unwilling to take a patient on methadone for drug detox/maintenance.   The only way I would consider CIR at this point, is if the family can provide some supervision at home. Otherwise, we will be in the same dilemma next week.  Rehab CM to follow up.  Meredith Staggers, MD, [place]

## 2013-11-27 NOTE — Clinical Social Work Note (Signed)
Admission coordinator with facility states that she will not admit patient today because signed prescription for methadone will not accompany patient to facility. Admission coordinator has already contacted patient's family. Admission coordinator states that she will accept patient on Monday if she still has a bed available and if a signed Methadone script is sent with patient. Per RNCM [facility] will not be accepting patient because family cannot provide 24/7 supervision. Admission coordinator stated that MD with facility will not write prescription for methadone. MD is unable to write prescription for Methadone due to federal regulations.   Liz Beach, [place], [place], [phone]

## 2013-11-27 NOTE — Progress Notes (Signed)
Pt's BP elevated at 170/110. Pt states feeling anxious and "blood pressure elevates when I need my methadone". 8:09 AM MD notified. Verbal order to give methadone and BP meds due at 1000am now.

## 2013-11-27 NOTE — Clinical Social Work Placement (Signed)
Clinical Social Work Department CLINICAL SOCIAL WORK PLACEMENT NOTE 11/27/2013  Patient:  NERO, SAWATZKY  Account Number:  [id number] Admit date:  11/21/2013  Clinical Social Worker:  Kemper Durie, [place]  Date/time:  11/24/2013 01:00 PM  Clinical Social Work is seeking post-discharge placement for this patient at the following level of care:   [place]   (*CSW will update this form in Epic as items are completed)   11/24/2013  Patient/family provided with [facility] Department of Clinical Social Work's list of facilities offering this level of care within the geographic area requested by the patient (or if unable, by the patient's family).  11/24/2013  Patient/family informed of their freedom to choose among providers that offer the needed level of care, that participate in Medicare, Medicaid or managed care program needed by the patient, have an available bed and are willing to accept the patient.  11/24/2013  Patient/family informed of [facility]' ownership interest in [facility], as well as of the fact that they are under no obligation to receive care at this facility.  PASARR submitted to [facility] on 11/24/2013 PASARR number received from [employer] on 11/24/2013  FL2 transmitted to all facilities in geographic area requested by pt/family on  11/24/2013 FL2 transmitted to all facilities within larger geographic area on 11/24/2013  Patient informed that his/her managed care company has contracts with or will negotiate with  certain facilities, including the following:     Patient/family informed of bed offers received:  11/25/2013 Patient chooses bed at OTHER Physician recommends and patient chooses bed at    Patient to be transferred to [facility] on  11/28/2013 Patient to be transferred to facility by Ambulance  The following physician request were entered in Epic:   Additional Comments: Patient will DC to [facility] in [facility] on 11/28/13. Daughter has  been updated. Detailed report left for weekend CSW. Attending will write prescription for 2 day supply of patient's methadone until facility's MD can see patient. Patient must be transported by [facility].   Liz Beach, [place], [place], [phone]

## 2013-11-27 NOTE — Discharge Summary (Signed)
Name: Mark [name] MRN: [id number] DOB: 24-Feb-1951 63 y.o. PCP: No Pcp Per Patient  Date of Admission: 11/21/2013  9:37 AM Date of Discharge: 11/28/2013 Attending Physician: Gilles Chiquito, MD  Discharge Diagnosis: Principal Problem:   Aspiration pneumonia Active Problems:   Essential hypertension, benign   Anemia   Acute encephalopathy   Generalized weakness   Adenocarcinoma, lung s/p partial lobectomy   Dysphagia, unspecified(787.20)   Chronic combined systolic and diastolic congestive heart failure   Substance abuse   Unspecified constipation   Pleural effusion   Pulmonary nodule   Lymphadenopathy   Esophageal dysmotility   Prolonged Q-T interval on ECG  Discharge Medications:   Medication List    STOP taking these medications       levofloxacin 750 MG tablet  Commonly known as:  LEVAQUIN     potassium chloride 10 MEQ tablet  Commonly known as:  K-DUR      TAKE these medications       amoxicillin-clavulanate 875-125 MG per tablet  Commonly known as:  AUGMENTIN  Take 1 tablet by mouth every 12 (twelve) hours.     aspirin 81 MG EC tablet  Take 1 tablet (81 mg total) by mouth daily.     docusate 50 MG/5ML liquid  Commonly known as:  COLACE  Take 5 mLs (50 mg total) by mouth daily.     folic acid 1 MG tablet  Commonly known as:  FOLVITE  Take 1 tablet (1 mg total) by mouth daily.     hydrALAZINE 10 MG tablet  Commonly known as:  APRESOLINE  Take 1 tablet (10 mg total) by mouth 3 (three) times daily.     lisinopril 20 MG tablet  Commonly known as:  PRINIVIL,ZESTRIL  Take 1 tablet (20 mg total) by mouth daily.     methadone 10 MG tablet  Commonly known as:  DOLOPHINE  Take 5 tablets (50 mg total) by mouth daily. Absolutely no refill from this provider, one time prescription only  Start taking on:  11/29/2013     multivitamin with minerals Tabs tablet  Take 1 tablet by mouth daily.     pantoprazole 20 MG tablet  Commonly known as:  PROTONIX  Take 1  tablet (20 mg total) by mouth daily.     polyethylene glycol powder powder  Commonly known as:  GLYCOLAX/MIRALAX  - Take 17 g by mouth 2 (two) times daily. Until daily soft stools  -   - OTC       Disposition and follow-up:   Mark [name] was discharged from [facility] in Stable condition.  At the hospital follow up visit please address:  Aspiration PNA--treated with IV Zosyn and transitioned to PO augmentin bid on day 7 to complete total 10 day course. Improvement of symptoms? Follow up imaging 4-6 weeks for resolution.   Esophageal dysmotility--tolerating dysphagia one diet?  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 Inda Merlin. 7mm LLL nodule on CT, needs follow up CT in one year. Several enlarged mediastinal nodes.   Prolonged QT--505qtc on 11/25/13 EKG. On methadone and was also taking Levaquin for ?CAP prior to admission. Avoid QT prolonging medications. Methadone dose decreased to 50mg  daily.   Iron deficiency anemia--Hb 9.5 during admission  with MCV 65.3.  Consider recheck iron panel and starting iron supplementation given improvement in esophageal dysmotility if any.   2.  Labs / imaging needed at time of follow-up: Follow up chest ct 1 year for 36mm LLL nodule. Consider repeat EKG to monitor QT interval. Consider repeat iron panel.   3.  Pending labs/ test needing follow-up: Follow up AFB culture with smear, final report in 6 weeks; fungal culture 4 weeks.   Follow-up Appointments: Follow-up Information   Follow up with [facility] MD. Schedule an appointment as soon as possible for a visit in 1 month. (for follow up of your previous lung cancer)    Specialty:  Oncology   Contact  information:   [place] [address] [phone]       Follow up with [facility] On 12/24/2013. (1:30pm, Dr. Chase Caller)    Contact information:   [place] [address]       Follow up with [facility]     On 12/10/2013. (2pm to establish with a primary care doctor)    Contact information:   [place] [address] [phone]     Discharge Instructions: Discharge Orders   Future Appointments Provider Department Dept Phone   12/10/2013 2:00 PM [facility] Covering Provider 2 [place] [phone]   12/24/2013 1:30 PM Brand Males, MD [facility] [phone]   Future Orders Complete By Expires   Call MD for:  difficulty breathing, headache or visual disturbances  As directed    Call MD for:  persistant nausea and vomiting  As directed    Call MD for:  severe uncontrolled pain  As directed    Call MD for:  temperature >100.4  As directed    Diet general  As directed    Comments:     Dysphagia I diet   Increase activity slowly  As directed    Comments:     Per PT   Increase activity slowly  As directed      Consultations:   Pulmonary  Procedures Performed:  Dg Chest 1 View  11/23/2013   CLINICAL DATA:  Status post right thoracentesis  EXAM: CHEST - 1 VIEW  COMPARISON:  CT chest dated 11/21/2013  FINDINGS: No definite pneumothorax is seen status post right thoracentesis.  Mild patchy right lower lobe opacity, suspicious for pneumonia.  Small loculated/layering left pleural effusion.  Cardiomegaly.  Residual contrast in the mid esophagus from recent fluoroscopic study, reflecting esophageal dysmotility and/or reflux.  IMPRESSION: No definite pneumothorax is seen status post right thoracentesis.  Small loculated/layering left pleural effusion.  Mild patchy right lower lobe opacity, suspicious for pneumonia.   Electronically Signed   By:  Julian Hy M.D.   On: 11/23/2013 10:46   Dg Chest 2 View  11/21/2013   CLINICAL DATA:  One week history of shortness of breath  EXAM: CHEST  2 VIEW  COMPARISON:  Prior chest x-ray 11/18/2013 ; prior CT chest/abdomen/ pelvis 02/26/2013  FINDINGS: Slightly increased ill-defined patchy airspace opacities predominantly in the right lung base but also in the bilateral mid lungs. A persistent moderate right layering pleural effusion. Also likely a small left pleural effusion versus pleural thickening. Unchanged cardiac and mediastinal contours. Atherosclerotic calcification noted in the transverse aorta. No acute osseous abnormality.  IMPRESSION: 1. Similar to slight interval progression of bilateral patchy airspace disease worst in the right lower lobe. Findings  remain concerning for a multifocal infectious/inflammatory process. 2. Small bilateral pleural effusions.   Electronically Signed   By: Jacqulynn Cadet M.D.   On: 11/21/2013 13:07   Dg Chest 2 View  11/18/2013   CLINICAL DATA:  Shortness of breath. Congestion for 2 days. Ex-smoker. Hypertension and diabetes.  EXAM: CHEST  2 VIEW  COMPARISON:  DG CHEST 2 VIEW dated 04/01/2013; CT CHEST W/CM dated 02/26/2013; NM PET IMAGE INITIAL (PI) SKULL BASE TO THIGH dated 03/17/2013  FINDINGS: Hyperinflation. Midline trachea. Mild cardiomegaly. Can't exclude right hilar soft tissue fullness. Improved left-sided aeration. Persistent interstitial and airspace opacities on the right. Most confluent in the right lower lobe. Small bilateral pleural effusions. No pneumothorax.  IMPRESSION: Patchy right-sided interstitial and airspace disease, suspicious for infection. Recommend radiographic follow-up until clearing.  Cardiomegaly with small bilateral pleural effusions.  Cannot exclude right hilar soft tissue fullness. Recommend attention on follow-up.   Electronically Signed   By: Abigail Miyamoto M.D.   On: 11/18/2013 22:25   Dg Hip Complete Left  11/06/2013   CLINICAL  DATA:  Hip pain  EXAM: LEFT HIP - COMPLETE 2+ VIEW  COMPARISON:  None.  FINDINGS: Postsurgical changes are noted in the proximal left femur. No acute fracture is identified. Pelvic ring is intact. Degenerative changes of the hip joint are seen.  IMPRESSION: Chronic changes without acute abnormality.   Electronically Signed   By: Inez Catalina M.D.   On: 11/06/2013 16:31   Ct Head Wo Contrast  11/19/2013   CLINICAL DATA:  Shortness of breath.  EXAM: CT HEAD WITHOUT CONTRAST  TECHNIQUE: Contiguous axial images were obtained from the base of the skull through the vertex without intravenous contrast.  COMPARISON:  Images from the PET/CT performed 03/17/2013, and MRI of the brain performed 03/02/2013  FINDINGS: There is no evidence of acute infarction, mass lesion, or intra- or extra-axial hemorrhage on CT.  Cerebellar atrophy is noted. Scattered periventricular and subcortical white matter change likely reflects small vessel ischemic microangiopathy. Scattered chronic lacunar infarcts are seen within the basal ganglia.  The brainstem and fourth ventricle are within normal limits. The third and lateral ventricles are unremarkable in appearance. The cerebral hemispheres are symmetric in appearance, with normal gray-white differentiation. No mass effect or midline shift is seen.  There is no evidence of fracture; visualized osseous structures are unremarkable in appearance. The visualized portions of the orbits are within normal limits. The paranasal sinuses and mastoid air cells are well-aerated. No significant soft tissue abnormalities are seen.  IMPRESSION: 1. No acute intracranial pathology seen on CT. 2. Cerebellar atrophy noted. 3. Scattered small vessel ischemic microangiopathy; scattered chronic lacunar infarcts within the basal ganglia.   Electronically Signed   By: Garald Balding M.D.   On: 11/19/2013 03:43   Ct Chest W Contrast  11/21/2013   CLINICAL DATA:  Known pneumonia, with productive cough and  generalized weakness.  EXAM: CT CHEST WITH CONTRAST  TECHNIQUE: Multidetector CT imaging of the chest was performed during intravenous contrast administration.  CONTRAST:  57mL OMNIPAQUE IOHEXOL 300 MG/ML  SOLN  COMPARISON:  Chest radiograph performed earlier today at 12:07 p.m., and CT of the chest performed 02/26/2013  FINDINGS: Hazy peribronchial opacities are seen bilaterally, more prominent at the right lung base, with associated bronchiectasis. More peripheral opacities are seen within the left lung. Small to moderate right and small left pleural effusions are seen, mildly loculated on the left. There is associated scarring at the lung bases, with scattered blebs  seen at the lung apices. Some of the opacities are relatively nodular in appearance. A 4 mm nodule is seen in the left lower lobe (image 46 of 69). There is no evidence of pneumothorax.  Scattered coronary artery calcifications are seen. The esophagus is partially filled with fluid, raising concern for esophageal dysmotility. Mildly enlarged right paratracheal, subcarinal and azygoesophageal recess nodes are seen, measuring up to 1.3 cm in short axis. Trace pericardial fluid remains within normal limits. The great vessels are grossly unremarkable in appearance. The thyroid gland is unremarkable in appearance. No axillary lymphadenopathy is seen.  A 2.9 x 1.2 cm collection of fluid attenuation along the anterior aspect of the liver is only minimally changed from prior study, and is likely benign. Trace fluid is seen tracking about the liver. The visualized portions of the liver and spleen are otherwise unremarkable.  IMPRESSION: 1. Hazy bilateral peribronchial opacities, more prominent at the right lung base, with associated bronchiectasis. More peripheral opacities seen within the left lung. Small to moderate right and small left pleural effusions seen, mildly loculated on left. This raises concern for multifocal pneumonia, possibly atypical in  nature. 2. Scarring at the lung bases, with scattered blebs at the lung apices. 4 mm nodule at the left lower lung lobe; this region was not well assessed on prior studies due to airspace opacification. If the patient is at high risk for bronchogenic carcinoma, follow-up chest CT at 1 year is recommended. If the patient is at low risk, no follow-up is needed. This recommendation follows the consensus statement: Guidelines for Management of Small Pulmonary Nodules Detected on CT Scans: A Statement from the [employer] as published in Radiology 2005; 237:395-400. 3. Scattered coronary artery calcifications seen. 4. Esophagus partially filled with fluid, raising concern for esophageal dysmotility. 5. Mildly enlarged mediastinal nodes seen, measuring up to 1.3 cm in short axis. Several of these are similar in size on the prior study, and are nonspecific in appearance. 6. 2.9 x 1.2 cm collection of fluid attenuation along the anterior aspect of the liver is only minimally changed from the prior study, and likely reflects a cyst. 7. Trace ascites noted about the liver.   Electronically Signed   By: Garald Balding M.D.   On: 11/21/2013 21:43   Dg Esophagus  11/23/2013   CLINICAL DATA:  Difficulty swallowing. Personal history of lung cancer. Generalized weakness. Dysphagia.  EXAM: ESOPHOGRAM/BARIUM SWALLOW  TECHNIQUE: Single contrast examination was performed using  thin barium.  COMPARISON:  Chest CT 11/21/2013.  FLUOROSCOPY TIME:  3 min 15 seconds  FINDINGS: Study is technically suboptimal. The patient was unable to cooperate with swallowing to adequately distend the esophagus. Only about 4 oz of contrast was ingested throughout the study despite instructions. There was profound esophageal stasis. No gross fixed strictures were identified. Tertiary contractions were observed. Esophageal diverticulum was present in the distal esophagus, just proximal to the gastroesophageal junction. 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 Because of patient debilitation, we could not repositioned patient to optimally visualize this distal esophageal diverticulum. No hiatal hernia was identified. There was not adequate distension to assess for reflux. The epiphrenic diverticulum is best visualized on series 20.  IMPRESSION: Technically suboptimal study due to patient debilitation, inability to cooperate with swallowing and small amount of ingested contrast. No focal strictures or esophageal mass lesions identified. Small epiphrenic diverticulum. Marked esophageal stasis. Tertiary contractions associated with nonspecific esophageal dysmotility disorder.   Electronically Signed   By: Dereck Ligas  M.D.   On: 11/23/2013 09:43   US Thoracentesis Asp Pleural Space W/img Guide  11/23/2013   CLINICAL DATA:  Pleural effusion, shortness of breath, request for diagnostic and therapeutic thoracentesis  EXAM: ULTRASOUND GUIDED Right THORACENTESIS  COMPARISON:  None.  FINDINGS: A total of approximately 1.2 liters of serous fluid was removed. A fluid sample was sent for laboratory analysis.  IMPRESSION: Successful ultrasound guided right thoracentesis yielding 1.2 liters of pleural fluid.  Read By:  Tsosie Billing PA-C  PROCEDURE: An ultrasound guided thoracentesis was thoroughly discussed with the patient and questions answered. The benefits, risks, alternatives and complications were also discussed. The patient understands and wishes to proceed with the procedure. Written consent was obtained.  Ultrasound was performed to localize and mark an adequate pocket of fluid in the right chest. The area was then prepped and draped in the normal sterile fashion. 1% Lidocaine was used for local anesthesia. Under ultrasound guidance a 19 gauge Yueh catheter was introduced. Thoracentesis was performed. The catheter was removed and a dressing applied.  Complications:  none.   Electronically Signed   By: Arne Cleveland M.D.   On:  11/23/2013 10:05   Admission HPI: Mark [name] is a 63 year old African American male with PMH of combined systolic and diastolic heart failure (per echo 03/2013 EF 35-40%), lung adenocarcinoma s/p L lower partial lobectomy, HTN, substance abuse (heroin now on methadone), ?seizure disorder, and recurrent pneumonia infections presenting to the ED today with daughter and son in-law for worsening generalized weakness, confusion, and worsening cough. The daughter Baxter Flattery) and son-in-law (Rod) report that for the past week he has been more lethargic, decreased appetite, confused and weak. Rod also reports that recently Mr. Vandeusen methadone dose was increased to 63mg  from the 50's 1-2 weeks ago and since then he has been noticing more of these changes. Mr. Sandoval was initially sleeping but then woke up to answer our questions. He was awake, alert and oriented. He reports feeling more tired lately and has not been eating much. He also reports falling a couple of days ago on the side of his bed but denies hitting his head, stating he had his head up the whole time, and denies loss of consciousness. In regards to his cough, he has been taking his levaquin as prescribed by the ED two days prior but says it has not helped much and that he still has a productive cough with white sputum. He denies shortness of breath, fever, or chills, no chest pain, nausea or vomiting and no diarrhea. He is constipated and has not had a BM in 5 days.   Hospital Course by problem list: Principal Problem:   Aspiration pneumonia Active Problems:   Essential hypertension, benign   Anemia   Acute encephalopathy   Generalized weakness   Adenocarcinoma, lung s/p partial lobectomy   Dysphagia, unspecified(787.20)   Chronic combined systolic and diastolic congestive heart failure   Substance abuse   Unspecified constipation   Pleural effusion   Pulmonary nodule   Lymphadenopathy   Esophageal dysmotility   Prolonged Q-T interval on ECG    CAP vs Aspiration PNA: Pt with esophageal dysmotility, concerning for aspiration pneumonia. Was being treated as outpatient with Levofloxacin prior to admission from ED visit without notable improvement.  Antibiotics were changed due to prolonged qtc as well.  CXR on admission with small loculated layering left pleural effusion and mild patchy RLL opacity suspicious for pneumonia.  CT chest with hazy b/l peribronchial opacities, b/l  effusions, concerning for multifocal pneumonia, possibly atypical.  Initially given azithromycin and ceftriaxone for CAP per ED, then changed to doxycycline and zosyn and then transitioned to IV Zosyn and seemed to be improving slowly during admission.  Tmax 99.36F this admission. No leukocytosis.  Transitioned to PO Augmentin 875mg  po bid on day 7 for total 10 day course.  Respiratory virus panel negative.  S/p thoracentesis 11/23/13. No malignant cells on cytology, culture negative. Respiratory virus pathogen panel negative. Blood culture no growth x5 days.  Acid fast culture and fungal culture final pending 4-6 weeks.  Recommend follow up with PCP and pulmonary and repeat cxr in 4-6 weeks with likely need for CT chest in 1 year to follow up new LLL 68mm nodule.   Acute encephalopathy: Resolved. Initially came in somnolent, then improved and became somnolent again for one day during hospital admission likely secondary to sedating medications including recently increased dose of methadone, ativan overnight night, and starting clonidine patch.  As dose of methadone was held and then restarted at 50mg  and clonidine patch wad discontinued, mental status improved back to baseline.  EEG wad done that showed mild generalized non-specific encephalopathy, no seizure or seizure predisposition.  He was initially on CIWA protocol given history of alcohol use, but only received ativan one time and was then discontinued.  He was maintained on fall and seizure precautions.  PT evaluated along with  OT who recommended CIR vs. SNF.  No beds available with CIR, so after discussion with family and bed availability, he will be discharged to SNF. Continue methadone 50mg  daily on discharge but may need to be adjusted based on withdrawal possibility or sedation.    Iron deficiency anemia: Hgb 8.5 with MCV 67 on admission, appears stable from 04/2013. Iron panel 03/2013: iron 18, ferritin 23, and TIBC 370.  Denies any hematuria or bloody BM. Not on iron supplementation at home. Hb 9.5 on discharge. PO iron supplementation not started during hospitalization initially given concern for aspiration and NPO. Diet then advanced with SLP recommendation to Dysphagia 1 with crushing of meds.  Consider starting po iron supplementation on hospital follow up and recheck iron panel along with weighing the risk vs. Benefit of iron supplementation given esophageal dysmotility.   Adenocarcinoma of lung with new L lung nodule: S/p left partial lobectomy in [place] (Dr. Cydney Ok once by Dr. Julien Nordmann at [facility] in May 2014 for ?recurrency and did not follow up for recurrent imaging. CT chest on admission shows b/l effusions, mild loculation on left ?multifocal PNA, 4mm nodule at LLL lobe that will need to be follow up in 1 year and mildly enlarged mediastinal nodes. Pulmonary was consulted during admission, recommended starting PPI, consider diuresis, complete antibiotics, no bronchoscopy indicated, and repeat imaging 6-12 months with outpatient pulmonary follow up as well.  Cytology with no malignant cells s/p thoracentesis--transudative effusion.  Will need to follow up with Oncologist, Dr. Earlie Server and Pulmonology.  Esophageal dysmotility: Initially NPO due to concern for aspiration.  GI was then consulted, who recommended barium study which was suboptimal due to inability to cooperate, however, did not show focal stricture or esophageal mass but did show small epiphrenic diverticulum and marked esophageal statis.  SLP following during admission and advanced diet to dys 1 diet puree, thin liquid, meds crushed with puree which was well tolerated during admission and recommended to continue upon discharge.    Chronic combined systolic and diastolic heart failure: Per echo 03/2013: EF 35-40%, diffuse hypokinesis, moderately reduced  systolic function, grade 2 diastolic dysfunction, diffuse hypokinesis. Home medications include ASA 81mg  qd, hydralazine 10mg  tid, and lisinopril 10mg  qd. Weight 154 on admission and down to 147 on 1/30. He was continued on lisinopril (increased to 20mg  qd) and hydralazine and asa daily.    HTN: Home medications included lisinopril 10mg  qd and hydralazine 10mg  TID.  BP elevated during admission, notably during possible opiate withdrawal but improved with PO medications.  Lisinopril dose increased to 20mg  daily and continued on home dose Hydralazine 10mg  TID. His blood pressure medications will likely need to be slowly uptitrated for better blood pressure control.  Hx of substance abuse: Hx of heroin and alcohol use, now on methadone. Was up to 63mg  daily at home that was recently increased per family and he was noted to have increased sedation and confusion after the dose change.  As a result, Methadone dose was decreased to 50mg  daily and was also held for a day during hospital course due to being NPO and increased sedation.  He did start showing signs of withdrawal and dose was adjusted to 50mg  daily which had mental status back to baseline and stable during admission.  Will continue methadone 50mg  daily on discharge but will likely need to be adjusted on follow up. HIV and UDS was negative this admission.  A one time prescription was printed for 2 doses of Methadone 50mg  for the patient for his Sunday and Monday doses. After this time, the facility physician will take over the prescription for the Methadone.   Constipation: Resolved. Pt reported no BM in 5 days prior to admission.  Given  enema during admission that resulted in BM and also continued mirlax and added colace. Bowel movements on 1/30.   Prolonged QTc: on EKG--505qtc on 11/25/13 EKG. Likely secondary to medications.  Was on Methadone at home, Levaquin for a few days prior to hospital admission for ?cap, and also received a dose of azithromycin on admission.  On methadone and was also taking Levaquin for ?CAP prior to admission. Avoid QT prolonging medications. Methadone dose decreased to 50mg  daily.   Discharge Vitals:   BP 170/118  Pulse 91  Temp(Src) 97.4 F (36.3 C) (Oral)  Resp 18  Ht 5\' 11"  (1.803 m)  Wt 144 lb 2.9 oz (65.4 kg)  BMI 20.12 kg/m2  SpO2 91%  Discharge Labs:  Results for orders placed during the hospital encounter of 11/21/13 (from the past 24 hour(s))  GLUCOSE, CAPILLARY     Status: Abnormal   Collection Time    11/27/13 12:24 PM      Result Value Range   Glucose-Capillary 117 (*) 70 - 99 mg/dL  GLUCOSE, CAPILLARY     Status: None   Collection Time    11/27/13  4:03 PM      Result Value Range   Glucose-Capillary 87  70 - 99 mg/dL   Comment 1 Notify RN    GLUCOSE, CAPILLARY     Status: Abnormal   Collection Time    11/27/13  7:42 PM      Result Value Range   Glucose-Capillary 100 (*) 70 - 99 mg/dL  GLUCOSE, CAPILLARY     Status: Abnormal   Collection Time    11/27/13 11:39 PM      Result Value Range   Glucose-Capillary 100 (*) 70 - 99 mg/dL   Comment 1 Notify RN     Comment 2 Documented in Chart    GLUCOSE, CAPILLARY     Status: None  Collection Time    11/28/13  3:47 AM      Result Value Range   Glucose-Capillary 92  70 - 99 mg/dL  GLUCOSE, CAPILLARY     Status: None   Collection Time    11/28/13  7:35 AM      Result Value Range   Glucose-Capillary 79  70 - 99 mg/dL    Signed: Otho Bellows, MD Internal Medicine Resident, PGY II [facility] 11/28/2013 12:18 PM    Time Spent on Discharge: 45 minutes Services Ordered on Discharge:  SNF Equipment Ordered on Discharge:

## 2013-11-27 NOTE — Progress Notes (Signed)
Physical Therapy Treatment Patient Details Name: Mark [name] MRN: [id number] DOB: 1951/03/07 Today's Date: 11/27/2013 Time: 6010-9323 PT Time Calculation (min): 23 min  PT Assessment / Plan / Recommendation  History of Present Illness HPI: This is a 63 year old man with a PMH of L lung adenocarcinoma s/p partial lobectomy without chemo/radiation in 2009 and history of TB, as well as multiple lacunar strokes with mild vascular dementia, CAD, HTN, previous GI bleed, depression and anxiety, and polysubstance abuse now on methadone, who presents with his daughter and son-in-law complaining of progressively worsening generalized weakness, sleep difficulty, and waxing and waning confusion and gait instability for the past two weeks. The history is per the patient and his family members. These symptoms have been associated with a cough productive of green and reddish sputum and mild shortness of breath at night over the same time period.     PT Comments   Limited participation today due to confusion.  Pt convinced someone on the way to pick him up to leave. Unable to redirect pt and only able to get pt OOB to chair with much verbal encouragement.  Follow Up Recommendations  SNF     Does the patient have the potential to tolerate intense rehabilitation     Barriers to Discharge        Equipment Recommendations  None recommended by PT    Recommendations for Other Services    Frequency     Progress towards PT Goals Progress towards PT goals: Not progressing toward goals - comment (due to confusion)  Plan Discharge plan needs to be updated    Precautions / Restrictions Precautions Precautions: Fall Restrictions Weight Bearing Restrictions: No   Pertinent Vitals/Pain SaO2>94% on RA during treatment.    Mobility  Bed Mobility Overal bed mobility: Needs Assistance Bed Mobility: Supine to Sit Supine to sit: Mod assist;HOB elevated General bed mobility comments: assist needed to initiate  mobility. Max verbal encouragement to participate in treatment due to pt thinking he was waiting for his ride. Transfers Equipment used: Straight cane Transfers: Risk manager Sit to Stand: Min assist Stand pivot transfers: Min assist General transfer comment: Pt required max encouragement to transfer to chair due to confusion.  Assist needed for balance.    Exercises     PT Diagnosis:    PT Problem List:   PT Treatment Interventions:     PT Goals (current goals can now be found in the care plan section)    Visit Information  Last PT Received On: 11/27/13 Assistance Needed: +1 History of Present Illness: HPI: This is a 63 year old man with a PMH of L lung adenocarcinoma s/p partial lobectomy without chemo/radiation in 2009 and history of TB, as well as multiple lacunar strokes with mild vascular dementia, CAD, HTN, previous GI bleed, depression and anxiety, and polysubstance abuse now on methadone, who presents with his daughter and son-in-law complaining of progressively worsening generalized weakness, sleep difficulty, and waxing and waning confusion and gait instability for the past two weeks. The history is per the patient and his family members. These symptoms have been associated with a cough productive of green and reddish sputum and mild shortness of breath at night over the same time period.      Subjective Data      Cognition  Cognition Arousal/Alertness: Awake/alert Behavior During Therapy: Anxious Overall Cognitive Status: Impaired/Different from baseline Orientation Level: Disoriented to;Situation;Time Memory: Decreased short-term memory;Decreased recall of precautions Following Commands: Follows one step commands inconsistently  Safety/Judgement: Decreased awareness of safety;Decreased awareness of deficits Problem Solving: Slow processing;Decreased initiation;Requires verbal cues;Requires tactile cues    Balance  Balance Sitting balance-Leahy Scale:  Good Standing balance support: Single extremity supported Standing balance-Leahy Scale: Poor  End of Session PT - End of Session Equipment Utilized During Treatment: Gait belt Activity Tolerance: Other (comment) (Limited by confusion.) Patient left: in chair;with call bell/phone within reach;with chair alarm set Nurse Communication: Mobility status   GP     [facility] 11/27/2013, 11:51 AM  [facility] PT [phone]

## 2013-11-27 NOTE — Progress Notes (Signed)
Dr. Eula Fried verbal order okay for pt not to have IV access because of d/c tomorrow.

## 2013-11-27 NOTE — Progress Notes (Signed)
Subjective and key labs:  [name] was seen and examined at bedside this morning.  He reports feeling well and just wants his medications earlier in the morning by 9am instead of 10am.  He will likely be discharged to SNF if available.   Objective: Vital signs in last 24 hours: Filed Vitals:   11/27/13 0942 11/27/13 1032 11/27/13 1226 11/27/13 1619  BP: 158/100  146/91 145/90  Pulse:  97 91   Temp:   97.4 F (36.3 C)   TempSrc:   Oral   Resp:   18   Height:      Weight:      SpO2:  100% 92%    Weight change:   Intake/Output Summary (Last 24 hours) at 11/27/13 1722 Last data filed at 11/27/13 1407  Gross per 24 hour  Intake    300 ml  Output    600 ml  Net   -300 ml   Vitals reviewed. General: NAD, lying in bed HEENT: PERRL, EOMI Cardiac: RRR Pulm:b/l mild base crackles  Abd: soft, nontender, nondistended, BS present Ext: warm and well perfused, no pedal edema, moving all 4 extremities Neuro: alert awake and oriented x3, strength equal in all extremities, sensation grossly intact  Lab Results: Basic Metabolic Panel:  Recent Labs Lab 11/22/13 1000  11/25/13 0659 11/27/13 0625  NA  --   < > 142 143  K  --   < > 4.5 4.7  CL  --   < > 109 104  CO2  --   < > 21 22  GLUCOSE  --   < > 77 87  BUN  --   < > 17 21  CREATININE  --   < > 0.99 1.02  CALCIUM  --   < > 8.0* 8.6  MG 1.9  --   --   --   < > = values in this interval not displayed. Liver Function Tests:  Recent Labs Lab 11/25/13 0659 11/27/13 0625  AST 121* 69*  ALT 73* 55*  ALKPHOS 112 110  BILITOT 0.8 1.2  PROT 7.2 8.0  ALBUMIN 2.1* 2.4*   CBC:  Recent Labs Lab 11/21/13 0942  11/24/13 0716 11/25/13 0659 11/27/13 0625  WBC 7.9  < > 7.9 6.8 9.0  NEUTROABS 5.9  --  5.0  --   --   HGB 8.5*  < > 8.6* 9.0* 9.5*  HCT 28.4*  < > 29.0* 29.8* 31.6*  MCV 67.0*  < > 66.2* 65.6* 65.3*  PLT 319  < > 356 318 409*  < > = values in this interval not displayed. Cardiac Enzymes:  Recent Labs Lab  11/25/13 1834 11/25/13 2337 11/26/13 0720  TROPONINI <0.30 <0.30 <0.30   CBG:  Recent Labs Lab 11/26/13 1938 11/27/13 0022 11/27/13 0356 11/27/13 0751 11/27/13 1224 11/27/13 1603  GLUCAP 96 100* 97 82 117* 87   Urine Drug Screen: Drugs of Abuse     Component Value Date/Time   LABOPIA NONE DETECTED 11/22/2013 0702   COCAINSCRNUR NONE DETECTED 11/22/2013 0702   LABBENZ NONE DETECTED 11/22/2013 0702   AMPHETMU NONE DETECTED 11/22/2013 0702   THCU NONE DETECTED 11/22/2013 0702   LABBARB NONE DETECTED 11/22/2013 0702    Urinalysis:  Recent Labs Lab 11/21/13 1236  COLORURINE AMBER*  LABSPEC 1.029  PHURINE 5.5  GLUCOSEU NEGATIVE  HGBUR NEGATIVE  BILIRUBINUR SMALL*  KETONESUR NEGATIVE  PROTEINUR NEGATIVE  UROBILINOGEN 2.0*  NITRITE NEGATIVE  LEUKOCYTESUR NEGATIVE   Medications: I have  reviewed the patient's current medications. Scheduled Meds: . amoxicillin-clavulanate  1 tablet Oral Q12H  . antiseptic oral rinse  15 mL Mouth Rinse q12n4p  . aspirin EC  81 mg Oral Daily  . chlorhexidine  15 mL Mouth Rinse BID  . docusate  50 mg Oral Daily  . folic acid  1 mg Oral Daily  . heparin  5,000 Units Subcutaneous Q8H  . hydrALAZINE  10 mg Oral TID  . lisinopril  20 mg Oral Daily  . methadone  50 mg Oral Daily  . multivitamin with minerals  1 tablet Oral Daily  . pantoprazole  20 mg Oral Daily  . polyethylene glycol  17 g Oral Daily  . promethazine  6.25 mg Intramuscular Once  . promethazine  12.5 mg Oral Once  . sodium chloride  3 mL Intravenous Q12H   Continuous Infusions:   PRN Meds:. Assessment/Plan: Principal Problem:   Aspiration pneumonia Active Problems:   Anemia   Acute encephalopathy   Generalized weakness   Adenocarcinoma, lung s/p partial lobectomy   Dysphagia, unspecified(787.20)   Chronic combined systolic and diastolic congestive heart failure   Substance abuse   Essential hypertension, benign   Unspecified constipation   Pleural effusion    Pulmonary nodule   Lymphadenopathy   Esophageal dysmotility   Prolonged Q-T interval on ECG [name] is a 63 year old African American male with combined CHF, lung adenocarcinoma s/p partial lobectomy, recurrent pneumonias, and substance abuse admitted for CAP and acute encephalopathy   Aspiration PNA: In setting of esophageal dysmotility, concerning for aspiration pneumonia.  Was on IV zosyn and will be transitioned to PO Augmentin bid today. S/p thoracentesis 11/23/13. No malignant cells on cytology, culture negative. -start po augmentin bid -oxygen prn, keep o2 sats >92%  -tylenol prn fever  -duoneb q6h   Acute encephalopathy--back to baseline. EEG--mild generalized non-specific encephalopathy, no seizure or seizure predisposition -continue methadone 50mg  daily for now -fall and seizure precautions   Iron deficiency anemia--Hb 8.5 with MCV 67 on admission, appears stable from 04/2013. Not on iron supplementation at home.  -recommend starting iron supplementation as outpatient if able to be crushed or if can afford liquid due to esophageal dysmotility. Will likely benefit from recheck iron panel as well, last one in 03/2013 with iron 18 and ferritin 23.    Adenocarcinoma of lung s/p left partial lobectomy in [place] (Dr. Cydney Ok once by Dr. Julien Nordmann at [place] [facility] in May 2014 but loss to follow up. CT chest on admission shows b/l effusions, mild loculation on left ?multifocal PNA, 55mm nodule at LLL lobe that will need to be follow up in 1 year, mildly enlarged mediastinal nodes.  -re-image 6-12 months per pulmonary, cytology with no malignant cells -will need outpatient oncology and pulmonary follow up on discharge  Esophageal dysmotility--per barium swallow and SLP assistance.  -dys 1 diet puree, thin liquid, meds crushed with puree; tolerating well -phenergan prn nausea  Chronic combined systolic and diastolic heart failure--per echo 03/2013: EF 35-40%, diffuse hypokinesis,  moderately reduced systolic function, grade 2 diastolic dysfunction, diffuse hypokinesis. Home medications include ASA 81mg  qd, hydralazine 10mg  tid, and lisinopril 10mg  qd. Weight 154 on admission-->147 -continue lisinopril and hydralazine and asa  HTN--Home medications include lisinopril 10mg  qd and hydralazine 10mg  TID.  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 -HIV neg -UDS neg -  continue methadone 50mg  daily  Constipation--resolved.  BM this morning.  On miralax and colace  Diet: dys 1 diet puree, thin liquid, meds crushed with puree DVT Ppx: Heparin Dispo: pending SNF acceptance given need for methadone. Challenging disposition as family seems to be unable to provide 24 hour care, and thus no longer a candidate for CIR.  Appreciate social work assistance.   The patient does have a current PCP (No Pcp Per Patient) and does not need an [facility] hospital follow-up appointment after discharge. Will follow up with community wellness center.    The patient does not have transportation limitations that hinder transportation to clinic appointments.  Services Needed at time of discharge: Y = Yes, Blank = No PT: CIR vs SNF  OT:   RN:   Equipment:   Other:     LOS: 6 days   Jerene Pitch, MD 11/27/2013, 5:22 PM

## 2013-11-28 DIAGNOSIS — I5042 Chronic combined systolic (congestive) and diastolic (congestive) heart failure: Secondary | ICD-10-CM

## 2013-11-28 LAB — GLUCOSE, CAPILLARY
GLUCOSE-CAPILLARY: 92 mg/dL (ref 70–99)
GLUCOSE-CAPILLARY: 101 mg/dL — AB (ref 70–99)
Glucose-Capillary: 79 mg/dL (ref 70–99)

## 2013-11-28 MED ORDER — MINERAL OIL RE ENEM
1.0000 | ENEMA | Freq: Once | RECTAL | Status: AC
  Administered 2013-11-28: 1 via RECTAL
  Filled 2013-11-28: qty 1

## 2013-11-28 MED ORDER — METHADONE HCL 10 MG PO TABS: 50.0000 mg | ORAL_TABLET | Freq: Every day | ORAL | Status: AC

## 2013-11-28 NOTE — Progress Notes (Signed)
Pt discharged to [facility] today by ambulance.  Family aware and completed paperwork at the facility.  No further discharge needs   [phone] (weekend CSW)

## 2013-11-28 NOTE — Progress Notes (Signed)
Pt had a formed, medium size bowel movement after enema was administered.

## 2013-11-28 NOTE — Progress Notes (Signed)
Pt complaining of constipation. Rogue Bussing, NP notified and an order for an enema was given. Will continue to monitor.

## 2013-11-28 NOTE — Progress Notes (Signed)
Have tried to call [facility] 4 times to give report without success. Phone is not being answered. Will continue to call to give report.  Devoria Albe RN

## 2013-11-28 NOTE — Progress Notes (Signed)
Federico Flake to be D/C'd SNF [facility] in [place] per MD order.  Discussed with the patient and all questions fully answered.    Medication List    STOP taking these medications       levofloxacin 750 MG tablet  Commonly known as:  LEVAQUIN     potassium chloride 10 MEQ tablet  Commonly known as:  K-DUR      TAKE these medications       amoxicillin-clavulanate 875-125 MG per tablet  Commonly known as:  AUGMENTIN  Take 1 tablet by mouth every 12 (twelve) hours.     aspirin 81 MG EC tablet  Take 1 tablet (81 mg total) by mouth daily.     docusate 50 MG/5ML liquid  Commonly known as:  COLACE  Take 5 mLs (50 mg total) by mouth daily.     folic acid 1 MG tablet  Commonly known as:  FOLVITE  Take 1 tablet (1 mg total) by mouth daily.     hydrALAZINE 10 MG tablet  Commonly known as:  APRESOLINE  Take 1 tablet (10 mg total) by mouth 3 (three) times daily.     lisinopril 20 MG tablet  Commonly known as:  PRINIVIL,ZESTRIL  Take 1 tablet (20 mg total) by mouth daily.     methadone 10 MG tablet  Commonly known as:  DOLOPHINE  Take 5 tablets (50 mg total) by mouth daily. Absolutely no refill from this provider, one time prescription only  Start taking on:  11/29/2013     multivitamin with minerals Tabs tablet  Take 1 tablet by mouth daily.     pantoprazole 20 MG tablet  Commonly known as:  PROTONIX  Take 1 tablet (20 mg total) by mouth daily.     polyethylene glycol powder powder  Commonly known as:  GLYCOLAX/MIRALAX  - Take 17 g by mouth 2 (two) times daily. Until daily soft stools  -   - OTC        VVS, Skin clean, dry and intact without evidence of skin break down, no evidence of skin tears noted. IV catheter discontinued intact. Site without signs and symptoms of complications. Dressing and pressure applied.  An After Visit Summary was printed and given to PTAR  To give to [facility]. Follow up appointments , new prescriptions and medication  administration times given in packet for [facility]. Report called to admitting nurse and all questions answered. Patient escorted via stretcher, and D/C to [facility] via [place].  Park Breed, RN 11/28/2013 3:46 PM

## 2013-11-28 NOTE — Progress Notes (Signed)
Subjective and key labs:  Pt reports feeling well. He denies any worsening of his SOB. He has a nursing facility bed at [facility] in [facility] and is ready to get out of the hospital today.  Objective: Vital signs in last 24 hours: Filed Vitals:   11/27/13 1953 11/28/13 0457 11/28/13 0509 11/28/13 1022  BP: 150/104 179/125 145/90 170/118  Pulse: 99 87  91  Temp:      TempSrc:      Resp: 18 18  18   Height:      Weight:  144 lb 2.9 oz (65.4 kg)    SpO2: 98% 91%     Weight change: -3 lb 1.4 oz (-1.4 kg)  Intake/Output Summary (Last 24 hours) at 11/28/13 1203 Last data filed at 11/27/13 1407  Gross per 24 hour  Intake    120 ml  Output      0 ml  Net    120 ml   Vitals reviewed. General: NAD, sitting on the side of the bed HEENT: PERRL, EOMI Cardiac: RRR Pulm: Mildly diminished breath sounds in LLL, otherwise clear Abd: soft, nontender, nondistended, BS present Ext: warm and well perfused, no pedal edema, moving all 4 extremities Neuro: alert awake and oriented x3, strength equal in all extremities, sensation grossly intact  Lab Results: Basic Metabolic Panel:  Recent Labs Lab 11/22/13 1000  11/25/13 0659 11/27/13 0625  NA  --   < > 142 143  K  --   < > 4.5 4.7  CL  --   < > 109 104  CO2  --   < > 21 22  GLUCOSE  --   < > 77 87  BUN  --   < > 17 21  CREATININE  --   < > 0.99 1.02  CALCIUM  --   < > 8.0* 8.6  MG 1.9  --   --   --   < > = values in this interval not displayed. Liver Function Tests:  Recent Labs Lab 11/25/13 0659 11/27/13 0625  AST 121* 69*  ALT 73* 55*  ALKPHOS 112 110  BILITOT 0.8 1.2  PROT 7.2 8.0  ALBUMIN 2.1* 2.4*   CBC:  Recent Labs Lab 11/24/13 0716 11/25/13 0659 11/27/13 0625  WBC 7.9 6.8 9.0  NEUTROABS 5.0  --   --   HGB 8.6* 9.0* 9.5*  HCT 29.0* 29.8* 31.6*  MCV 66.2* 65.6* 65.3*  PLT 356 318 409*   Cardiac Enzymes:  Recent Labs Lab 11/25/13 1834 11/25/13 2337 11/26/13 0720  TROPONINI <0.30 <0.30 <0.30    CBG:  Recent Labs Lab 11/27/13 1224 11/27/13 1603 11/27/13 1942 11/27/13 2339 11/28/13 0347 11/28/13 0735  GLUCAP 117* 87 100* 100* 92 79   Urine Drug Screen: Drugs of Abuse     Component Value Date/Time   LABOPIA NONE DETECTED 11/22/2013 0702   COCAINSCRNUR NONE DETECTED 11/22/2013 0702   LABBENZ NONE DETECTED 11/22/2013 0702   AMPHETMU NONE DETECTED 11/22/2013 0702   THCU NONE DETECTED 11/22/2013 0702   LABBARB NONE DETECTED 11/22/2013 0702    Urinalysis:  Recent Labs Lab 11/21/13 1236  COLORURINE AMBER*  LABSPEC 1.029  PHURINE 5.5  GLUCOSEU NEGATIVE  HGBUR NEGATIVE  BILIRUBINUR SMALL*  KETONESUR NEGATIVE  PROTEINUR NEGATIVE  UROBILINOGEN 2.0*  NITRITE NEGATIVE  LEUKOCYTESUR NEGATIVE   Medications: I have reviewed the patient's current medications. Scheduled Meds: . amoxicillin-clavulanate  1 tablet Oral Q12H  . antiseptic oral rinse  15 mL Mouth Rinse q12n4p  .  aspirin EC  81 mg Oral Daily  . chlorhexidine  15 mL Mouth Rinse BID  . docusate  50 mg Oral Daily  . folic acid  1 mg Oral Daily  . heparin  5,000 Units Subcutaneous Q8H  . hydrALAZINE  10 mg Oral TID  . lisinopril  20 mg Oral Daily  . methadone  50 mg Oral Daily  . multivitamin with minerals  1 tablet Oral Daily  . pantoprazole  20 mg Oral Daily  . polyethylene glycol  17 g Oral Daily  . promethazine  6.25 mg Intramuscular Once  . sodium chloride  3 mL Intravenous Q12H   Continuous Infusions:   PRN Meds:. Assessment/Plan: Mark [name] is a 63 year old African American male with combined CHF, lung adenocarcinoma s/p partial lobectomy, recurrent pneumonias, and substance abuse admitted for CAP and acute encephalopathy   CAP vs Aspiration PNA: In setting of esophageal dysmotility, concerning for aspiration pneumonia.  Was on IV zosyn and will be transitioned to PO Augmentin bid on 1/30. S/p thoracentesis 11/23/13. No malignant cells on cytology, culture negative. - Continue po augmentin bid -  oxygen prn, keep o2 sats >92%  - tylenol prn fever  - duoneb q6h   Acute encephalopathy: Resolved. Pt now at baseline. EEG w/ mild generalized non-specific encephalopathy, no seizure or seizure predisposition -continue methadone 50mg  daily for now -fall and seizure precautions   Iron deficiency anemia: Hgb 8.5 with MCV 67 on admission, appears stable from 04/2013. Anemia panel from 04/01/13 with iron deficiency but with low-normal ferritin level. Not on iron supplementation at home. He will need to begin iron supplementation as outpatient if able to be crushed or if can afford liquid due to esophageal dysmotility. Will benefit from recheck iron panel as well, last one in 03/2013 with iron 18 and ferritin 23.    Adenocarcinoma of lung: S/p left partial lobectomy in [place] (Dr. Cydney Ok once by Dr. Julien [name] at [place] [facility] in May 2014 but loss to follow up. CT chest on admission shows b/l effusions, mild loculation on left ?multifocal PNA, 68mm nodule at LLL lobe that will need to be follow up in 1 year, mildly enlarged mediastinal nodes.  -re-image 6-12 months per pulmonary, cytology with no malignant cells -will need outpatient oncology and pulmonary follow up on discharge   Esophageal dysmotility: Per barium swallow and SLP assistance.  -dys 1 diet puree, thin liquid, meds crushed with puree; tolerating well -phenergan prn nausea  Chronic combined systolic and diastolic heart failure: Per echo 03/2013: EF 35-40%, diffuse hypokinesis, moderately reduced systolic function, grade 2 diastolic dysfunction, diffuse hypokinesis. Home medications include ASA 81mg  qd, hydralazine 10mg  tid, and lisinopril 10mg  qd. Weight 154 on admission-->147 -continue lisinopril and hydralazine and asa  HTN: Home medications include lisinopril 10mg  qd and hydralazine 10mg  TID. Pt with HTN this admission, so his Lisinopril was increased to 20mg  daily. His blood pressure medications will likely need to be slowly  uptitrated for better blood pressure control.  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 He is currently taking 50mg  daily.  -HIV neg -UDS neg -continue methadone 50mg  daily  Constipation: Resolved.  Multiple bowel movements on 1/30.  On miralax and colace PRN  Diet: dys 1 diet puree, thin liquid, meds crushed with puree  DVT Ppx: Heparin  Dispo: Discharge today. Pt accepted to [facility] in [place], as  family seems to be unable to provide 24 hour care, and thus no longer a candidate for CIR.  Appreciate social work assistance.   The patient does have a current PCP (No Pcp Per Patient) and does not need an [facility] hospital follow-up appointment after discharge. Will follow up with community wellness center.    The patient does not have transportation limitations that hinder transportation to clinic appointments.  Services Needed at time of discharge: Y = Yes, Blank = No PT: CIR vs SNF  OT:   RN:   Equipment:   Other:     LOS: 7 days   Otho Bellows, MD 11/28/2013, 12:03 PM

## 2013-11-28 NOTE — Progress Notes (Signed)
  I have seen and examined the patient, and reviewed the daily progress note by Corey Skains, MS 4 and discussed the care of the patient with them. Please see my progress note from 11/27/2013 for further details regarding assessment and plan.    Signed:  Jerene Pitch, MD 11/28/2013, 12:02 AM

## 2013-11-29 NOTE — Discharge Summary (Signed)
I assisted in the discharge of Mark [name] with the resident team.  He is on Methadone and he was given a 2 day only prescription to get him through the weekend and the provider at his care facility should then take over the prescription.

## 2013-11-30 ENCOUNTER — Telehealth: Payer: Self-pay | Admitting: *Deleted

## 2013-11-30 ENCOUNTER — Encounter (HOSPITAL_COMMUNITY): Payer: Medicaid Other | Admitting: Occupational Therapy

## 2013-11-30 ENCOUNTER — Inpatient Hospital Stay (HOSPITAL_COMMUNITY): Payer: Medicaid Other | Admitting: Occupational Therapy

## 2013-11-30 ENCOUNTER — Inpatient Hospital Stay (HOSPITAL_COMMUNITY): Payer: Medicaid Other | Admitting: *Deleted

## 2013-11-30 DIAGNOSIS — C3490 Malignant neoplasm of unspecified part of unspecified bronchus or lung: Secondary | ICD-10-CM

## 2013-11-30 NOTE — Telephone Encounter (Signed)
Per Dr Vista Mink, onc tx schedule filled out for lab and f/u in 1-2 weeks.  SLJ

## 2013-11-30 NOTE — Telephone Encounter (Signed)
Message copied by [name] on Mon Nov 30, 2013 10:06 AM ------      Message from: Curt Bears      Created: Thu Nov 26, 2013  3:40 PM       Thank you Samaya. We will get a F/U appt in the next few weeks.      ----- Message -----         From: Jerene Pitch, MD         Sent: 11/26/2013  11:18 AM           To: Curt Bears, MD            Hi Dr. Julien Nordmann,            My apologies. You saw this patient in May of last year and he was to follow up in 6 months and repeat imaging but seems like he did not do so. He is currently admitted on our service at this time and will try to follow up with you again upon discharge.             Thank you,      Samaya      ----- Message -----         From: Curt Bears, MD         Sent: 11/26/2013   8:26 AM           To: Jerene Pitch, MD            Sharion Balloon,      I do not know this patient. Is he being referred to me? Thank you.      ----- Message -----         From: Jerene Pitch, MD         Sent: 11/25/2013   9:24 PM           To: Curt Bears, MD                        ----- Message -----         From: Axel Filler, MD         Sent: 11/25/2013   1:16 PM           To: Jerene Pitch, MD                        ----- Message -----         From: [facility]         Sent: 11/25/2013   7:23 AM           To: Axel Filler, MD                               ------

## 2013-11-30 NOTE — Telephone Encounter (Signed)
Kathlee Nations from [place] office called to request NPI number for patient.  Pt is currently is in-patient rehab.  NPI given x 1, pt needs to contact medicaid office to change provider information on card. PCP is [facility]. Appt with [facility] 12/10/2013 at 2 PM. Derl Barrow, RN

## 2013-12-01 ENCOUNTER — Telehealth: Payer: Self-pay | Admitting: Internal Medicine

## 2013-12-01 NOTE — Telephone Encounter (Signed)
s.w. pt and advised on Feb appt....pt ok and aware

## 2013-12-10 ENCOUNTER — Encounter (INDEPENDENT_AMBULATORY_CARE_PROVIDER_SITE_OTHER): Payer: Self-pay

## 2013-12-10 ENCOUNTER — Ambulatory Visit: Payer: Medicaid Other | Attending: Internal Medicine | Admitting: Family Medicine

## 2013-12-10 ENCOUNTER — Encounter: Payer: Self-pay | Admitting: Family Medicine

## 2013-12-10 VITALS — BP 162/116 | HR 69 | Temp 98.6°F | Resp 13 | Ht 67.0 in | Wt 120.0 lb

## 2013-12-10 DIAGNOSIS — I10 Essential (primary) hypertension: Secondary | ICD-10-CM | POA: Insufficient documentation

## 2013-12-10 DIAGNOSIS — F0390 Unspecified dementia without behavioral disturbance: Secondary | ICD-10-CM | POA: Insufficient documentation

## 2013-12-10 DIAGNOSIS — F03918 Unspecified dementia, unspecified severity, with other behavioral disturbance: Secondary | ICD-10-CM

## 2013-12-10 DIAGNOSIS — F32A Depression, unspecified: Secondary | ICD-10-CM

## 2013-12-10 DIAGNOSIS — C3490 Malignant neoplasm of unspecified part of unspecified bronchus or lung: Secondary | ICD-10-CM | POA: Insufficient documentation

## 2013-12-10 DIAGNOSIS — F3289 Other specified depressive episodes: Secondary | ICD-10-CM | POA: Insufficient documentation

## 2013-12-10 DIAGNOSIS — F329 Major depressive disorder, single episode, unspecified: Secondary | ICD-10-CM

## 2013-12-10 DIAGNOSIS — F411 Generalized anxiety disorder: Secondary | ICD-10-CM

## 2013-12-10 DIAGNOSIS — F0391 Unspecified dementia with behavioral disturbance: Secondary | ICD-10-CM

## 2013-12-10 DIAGNOSIS — D649 Anemia, unspecified: Secondary | ICD-10-CM | POA: Insufficient documentation

## 2013-12-10 NOTE — Progress Notes (Signed)
Patient is here to establish care. Patient sometimes uses a wheelchair due to recent hip surgery and a fall. Complains of pain in Lt hip, shooting pain from Lt hip to Lt arm up to the heart; comes and goes. Patient also has mini seizures and is on oxygen. Recently had pneumonia x2 weeks and since then has been nauseated and vomiting. Unable to keep any food down unless it is small. Has had a lung removed. Also has a history of hypertension, anxiety since his wife died, and struggles with depression. Patient also complains of SOB.

## 2013-12-10 NOTE — Progress Notes (Signed)
   Subjective:    Patient ID: Mark [name], male    DOB: 1951-01-12, 63 y.o.   MRN: [id number]  HPI  Patient is here to establish care. He is currently in rehabilitation following a hospital stay for pneumonia. Last fall he was diagnosed with lung cancer, had a partial lobectomy, did not require chemotherapy or radiation, and moved down here with his daughter. Unfortunately he has a long history of polysubstance abuse. He is currently in a methadone program. He also has a history of anxiety, depression, and some dementia. The daughter is not sure to what extent if at all these have related to the polysubstance abuse in the past. Currently he drinks 2-3 drinks one to 2 times per week.  Patient also has a history of hypertension, anemia of chronic disease,   Review of Systems A 12 point review of systems is negative except as per hpi.       Objective:   Physical Exam  Nursing note and vitals reviewed. Constitutional: He is oriented to person, place, and time. He  appears well-developed and is thin and almost cachectic looking.  HENT:  Right Ear: External ear normal.  Left Ear: External ear normal.  Nose: Nose normal.  Mouth/Throat: Oropharynx is clear and moist. No oropharyngeal exudate.  Eyes: Conjunctivae are normal. Pupils are equal, round, and reactive to light.  Neck: Normal range of motion. Neck supple. No thyromegaly present.  Cardiovascular: Normal rate, regular rhythm and normal heart sounds.   Pulmonary/Chest: Effort normal and breath sounds normal.  Abdominal: Soft. Bowel sounds are normal.  no distension. There is no tenderness. There is no rebound.  Lymphadenopathy:    He has no cervical adenopathy.  Neurological: He is alert and oriented to person, place, and time. He has normal reflexes.  Skin: Skin is warm and dry.He has no concerning moles or skin lesions Psychiatric: He has a flat affect. His behavior is normal but daughter does nmost of the talking for him.        Assessment & Plan:  Mark [name] was seen today for establish care.  Diagnoses and associated orders for this visit:  Dementia with behavioral disturbance - Ambulatory referral to Psychiatry  Depression - Ambulatory referral to Psychiatry  Generalized anxiety disorder - Ambulatory referral to Psychiatry  Patient doesn't need any refills on medications at this time since he is in rehabilitation. The daughter will call when he is ready to leave rehabilitation for an appointment here and I've asked her to bring his discharge medication list at that time. He is 29 seeing an oncologist here as well as cardiologist. I would like him to see psychiatry given his history of dementia, polysubstance abuse, anxiety and depression. I've also asked him to continue to cut down on the alcohol.

## 2013-12-12 ENCOUNTER — Inpatient Hospital Stay (HOSPITAL_COMMUNITY)
Admission: EM | Admit: 2013-12-12 | Discharge: 2013-12-15 | DRG: 291 | Disposition: A | Discharge status: Home / Self Care | Payer: Medicaid Other | Attending: Internal Medicine | Admitting: Internal Medicine

## 2013-12-12 ENCOUNTER — Encounter (HOSPITAL_COMMUNITY): Payer: Self-pay | Admitting: Emergency Medicine

## 2013-12-12 ENCOUNTER — Emergency Department (HOSPITAL_COMMUNITY): Payer: Medicaid Other

## 2013-12-12 DIAGNOSIS — R9431 Abnormal electrocardiogram [ECG] [EKG]: Secondary | ICD-10-CM | POA: Diagnosis present

## 2013-12-12 DIAGNOSIS — I509 Heart failure, unspecified: Principal | ICD-10-CM | POA: Diagnosis present

## 2013-12-12 DIAGNOSIS — J690 Pneumonitis due to inhalation of food and vomit: Secondary | ICD-10-CM | POA: Diagnosis present

## 2013-12-12 DIAGNOSIS — D509 Iron deficiency anemia, unspecified: Secondary | ICD-10-CM | POA: Diagnosis present

## 2013-12-12 DIAGNOSIS — I5042 Chronic combined systolic (congestive) and diastolic (congestive) heart failure: Secondary | ICD-10-CM | POA: Diagnosis present

## 2013-12-12 DIAGNOSIS — J189 Pneumonia, unspecified organism: Secondary | ICD-10-CM | POA: Diagnosis present

## 2013-12-12 DIAGNOSIS — E8809 Other disorders of plasma-protein metabolism, not elsewhere classified: Secondary | ICD-10-CM | POA: Diagnosis present

## 2013-12-12 DIAGNOSIS — K5900 Constipation, unspecified: Secondary | ICD-10-CM | POA: Diagnosis present

## 2013-12-12 DIAGNOSIS — F0390 Unspecified dementia without behavioral disturbance: Secondary | ICD-10-CM | POA: Diagnosis present

## 2013-12-12 DIAGNOSIS — E870 Hyperosmolality and hypernatremia: Secondary | ICD-10-CM | POA: Diagnosis present

## 2013-12-12 DIAGNOSIS — Z79899 Other long term (current) drug therapy: Secondary | ICD-10-CM

## 2013-12-12 DIAGNOSIS — R531 Weakness: Secondary | ICD-10-CM

## 2013-12-12 DIAGNOSIS — J9601 Acute respiratory failure with hypoxia: Secondary | ICD-10-CM

## 2013-12-12 DIAGNOSIS — R110 Nausea: Secondary | ICD-10-CM

## 2013-12-12 DIAGNOSIS — R52 Pain, unspecified: Secondary | ICD-10-CM | POA: Diagnosis present

## 2013-12-12 DIAGNOSIS — Z9981 Dependence on supplemental oxygen: Secondary | ICD-10-CM

## 2013-12-12 DIAGNOSIS — J90 Pleural effusion, not elsewhere classified: Secondary | ICD-10-CM | POA: Diagnosis present

## 2013-12-12 DIAGNOSIS — Z7982 Long term (current) use of aspirin: Secondary | ICD-10-CM

## 2013-12-12 DIAGNOSIS — R630 Anorexia: Secondary | ICD-10-CM | POA: Diagnosis present

## 2013-12-12 DIAGNOSIS — R1310 Dysphagia, unspecified: Secondary | ICD-10-CM | POA: Diagnosis present

## 2013-12-12 DIAGNOSIS — B370 Candidal stomatitis: Secondary | ICD-10-CM | POA: Diagnosis present

## 2013-12-12 DIAGNOSIS — I2510 Atherosclerotic heart disease of native coronary artery without angina pectoris: Secondary | ICD-10-CM | POA: Diagnosis present

## 2013-12-12 DIAGNOSIS — R591 Generalized enlarged lymph nodes: Secondary | ICD-10-CM | POA: Diagnosis present

## 2013-12-12 DIAGNOSIS — E876 Hypokalemia: Secondary | ICD-10-CM | POA: Diagnosis present

## 2013-12-12 DIAGNOSIS — F1010 Alcohol abuse, uncomplicated: Secondary | ICD-10-CM | POA: Diagnosis present

## 2013-12-12 DIAGNOSIS — K224 Dyskinesia of esophagus: Secondary | ICD-10-CM | POA: Diagnosis present

## 2013-12-12 DIAGNOSIS — D649 Anemia, unspecified: Secondary | ICD-10-CM | POA: Diagnosis present

## 2013-12-12 DIAGNOSIS — R5381 Other malaise: Secondary | ICD-10-CM | POA: Diagnosis present

## 2013-12-12 DIAGNOSIS — J9600 Acute respiratory failure, unspecified whether with hypoxia or hypercapnia: Secondary | ICD-10-CM | POA: Diagnosis present

## 2013-12-12 DIAGNOSIS — R5383 Other fatigue: Secondary | ICD-10-CM

## 2013-12-12 DIAGNOSIS — Z8701 Personal history of pneumonia (recurrent): Secondary | ICD-10-CM

## 2013-12-12 DIAGNOSIS — Z87891 Personal history of nicotine dependence: Secondary | ICD-10-CM

## 2013-12-12 DIAGNOSIS — R634 Abnormal weight loss: Secondary | ICD-10-CM | POA: Diagnosis present

## 2013-12-12 DIAGNOSIS — Z8673 Personal history of transient ischemic attack (TIA), and cerebral infarction without residual deficits: Secondary | ICD-10-CM

## 2013-12-12 DIAGNOSIS — F1910 Other psychoactive substance abuse, uncomplicated: Secondary | ICD-10-CM | POA: Diagnosis present

## 2013-12-12 DIAGNOSIS — R05 Cough: Secondary | ICD-10-CM

## 2013-12-12 DIAGNOSIS — I5041 Acute combined systolic (congestive) and diastolic (congestive) heart failure: Secondary | ICD-10-CM

## 2013-12-12 DIAGNOSIS — C3490 Malignant neoplasm of unspecified part of unspecified bronchus or lung: Secondary | ICD-10-CM

## 2013-12-12 DIAGNOSIS — Z85118 Personal history of other malignant neoplasm of bronchus and lung: Secondary | ICD-10-CM

## 2013-12-12 DIAGNOSIS — E43 Unspecified severe protein-calorie malnutrition: Secondary | ICD-10-CM | POA: Insufficient documentation

## 2013-12-12 DIAGNOSIS — I10 Essential (primary) hypertension: Secondary | ICD-10-CM | POA: Diagnosis present

## 2013-12-12 DIAGNOSIS — Z66 Do not resuscitate: Secondary | ICD-10-CM | POA: Diagnosis present

## 2013-12-12 DIAGNOSIS — R059 Cough, unspecified: Secondary | ICD-10-CM

## 2013-12-12 DIAGNOSIS — N390 Urinary tract infection, site not specified: Secondary | ICD-10-CM | POA: Diagnosis present

## 2013-12-12 LAB — URINALYSIS, ROUTINE W REFLEX MICROSCOPIC
Glucose, UA: NEGATIVE mg/dL
Ketones, ur: 15 mg/dL — AB
Nitrite: NEGATIVE
PROTEIN: 30 mg/dL — AB
SPECIFIC GRAVITY, URINE: 1.029 (ref 1.005–1.030)
Urobilinogen, UA: 4 mg/dL — ABNORMAL HIGH (ref 0.0–1.0)
pH: 6 (ref 5.0–8.0)

## 2013-12-12 LAB — COMPREHENSIVE METABOLIC PANEL
ALK PHOS: 164 U/L — AB (ref 39–117)
ALT: 87 U/L — ABNORMAL HIGH (ref 0–53)
AST: 127 U/L — ABNORMAL HIGH (ref 0–37)
Albumin: 2.6 g/dL — ABNORMAL LOW (ref 3.5–5.2)
BUN: 19 mg/dL (ref 6–23)
CHLORIDE: 101 meq/L (ref 96–112)
CO2: 27 meq/L (ref 19–32)
Calcium: 8.7 mg/dL (ref 8.4–10.5)
Creatinine, Ser: 0.99 mg/dL (ref 0.50–1.35)
GFR calc Af Amer: >90 mL/min (ref >90)
GFR calc non Af Amer: 85 mL/min — ABNORMAL LOW (ref >90)
GLUCOSE: 101 mg/dL — AB (ref 70–99)
POTASSIUM: 3.4 meq/L — AB (ref 3.7–5.3)
Sodium: 141 mEq/L (ref 137–147)
Total Bilirubin: 2.1 mg/dL — ABNORMAL HIGH (ref 0.3–1.2)
Total Protein: 8.9 g/dL — ABNORMAL HIGH (ref 6.0–8.3)

## 2013-12-12 LAB — CBC WITH DIFFERENTIAL/PLATELET
Basophils Absolute: 0 10*3/uL (ref 0.0–0.1)
Basophils Relative: 0 % (ref 0–1)
Eosinophils Absolute: 0.1 10*3/uL (ref 0.0–0.7)
Eosinophils Relative: 1 % (ref 0–5)
HCT: 34.9 % — ABNORMAL LOW (ref 39.0–52.0)
Hemoglobin: 10.3 g/dL — ABNORMAL LOW (ref 13.0–17.0)
LYMPHS PCT: 22 % (ref 12–46)
Lymphs Abs: 1.9 10*3/uL (ref 0.7–4.0)
MCH: 19.6 pg — ABNORMAL LOW (ref 26.0–34.0)
MCHC: 29.5 g/dL — ABNORMAL LOW (ref 30.0–36.0)
MCV: 66.5 fL — ABNORMAL LOW (ref 78.0–100.0)
MONOS PCT: 4 % (ref 3–12)
Monocytes Absolute: 0.3 10*3/uL (ref 0.1–1.0)
NEUTROS ABS: 6.5 10*3/uL (ref 1.7–7.7)
NEUTROS PCT: 74 % (ref 43–77)
Platelets: 349 10*3/uL (ref 150–400)
RBC: 5.25 MIL/uL (ref 4.22–5.81)
RDW: 21.3 % — ABNORMAL HIGH (ref 11.5–15.5)
WBC: 8.8 10*3/uL (ref 4.0–10.5)

## 2013-12-12 LAB — URINE MICROSCOPIC-ADD ON

## 2013-12-12 LAB — LIPASE, BLOOD: Lipase: 31 U/L (ref 11–59)

## 2013-12-12 LAB — PRO B NATRIURETIC PEPTIDE: PRO B NATRI PEPTIDE: 3564 pg/mL — AB (ref 0–125)

## 2013-12-12 LAB — TROPONIN I

## 2013-12-12 MED ORDER — VANCOMYCIN HCL IN DEXTROSE 1-5 GM/200ML-% IV SOLN
1000.0000 mg | Freq: Once | INTRAVENOUS | Status: AC
  Administered 2013-12-12: 1000 mg via INTRAVENOUS
  Filled 2013-12-12: qty 200

## 2013-12-12 MED ORDER — METHADONE HCL 10 MG PO TABS
50.0000 mg | ORAL_TABLET | Freq: Every day | ORAL | Status: DC
  Administered 2013-12-13 – 2013-12-15 (×3): 50 mg via ORAL
  Filled 2013-12-12 (×3): qty 5

## 2013-12-12 MED ORDER — HYDRALAZINE HCL 20 MG/ML IJ SOLN
5.0000 mg | Freq: Four times a day (QID) | INTRAMUSCULAR | Status: AC | PRN
  Administered 2013-12-13 (×2): 5 mg via INTRAVENOUS
  Filled 2013-12-12 (×2): qty 1

## 2013-12-12 MED ORDER — ENOXAPARIN SODIUM 30 MG/0.3ML ~~LOC~~ SOLN
30.0000 mg | SUBCUTANEOUS | Status: DC
  Administered 2013-12-13 – 2013-12-15 (×3): 30 mg via SUBCUTANEOUS
  Filled 2013-12-12 (×3): qty 0.3

## 2013-12-12 MED ORDER — VANCOMYCIN HCL IN DEXTROSE 750-5 MG/150ML-% IV SOLN
750.0000 mg | Freq: Two times a day (BID) | INTRAVENOUS | Status: DC
  Administered 2013-12-13: 750 mg via INTRAVENOUS
  Filled 2013-12-12 (×2): qty 150

## 2013-12-12 MED ORDER — PIPERACILLIN-TAZOBACTAM 3.375 G IVPB 30 MIN
3.3750 g | Freq: Once | INTRAVENOUS | Status: AC
  Administered 2013-12-12: 3.375 g via INTRAVENOUS
  Filled 2013-12-12: qty 50

## 2013-12-12 MED ORDER — SODIUM CHLORIDE 0.9 % IJ SOLN
3.0000 mL | Freq: Two times a day (BID) | INTRAMUSCULAR | Status: DC
  Administered 2013-12-12 – 2013-12-15 (×4): 3 mL via INTRAVENOUS

## 2013-12-12 MED ORDER — POTASSIUM CHLORIDE 10 MEQ/100ML IV SOLN
10.0000 meq | INTRAVENOUS | Status: AC
  Administered 2013-12-12 – 2013-12-13 (×3): 10 meq via INTRAVENOUS
  Filled 2013-12-12 (×3): qty 100

## 2013-12-12 MED ORDER — SODIUM CHLORIDE 0.9 % IV BOLUS (SEPSIS)
500.0000 mL | Freq: Once | INTRAVENOUS | Status: AC
  Administered 2013-12-12: 500 mL via INTRAVENOUS

## 2013-12-12 MED ORDER — PIPERACILLIN-TAZOBACTAM 3.375 G IVPB
3.3750 g | Freq: Three times a day (TID) | INTRAVENOUS | Status: DC
  Administered 2013-12-13 (×2): 3.375 g via INTRAVENOUS
  Filled 2013-12-12 (×3): qty 50

## 2013-12-12 MED ORDER — ASPIRIN EC 81 MG PO TBEC
81.0000 mg | DELAYED_RELEASE_TABLET | Freq: Every day | ORAL | Status: DC
  Administered 2013-12-13 – 2013-12-15 (×3): 81 mg via ORAL
  Filled 2013-12-12 (×3): qty 1

## 2013-12-12 MED ORDER — PANTOPRAZOLE SODIUM 20 MG PO TBEC
20.0000 mg | DELAYED_RELEASE_TABLET | Freq: Every day | ORAL | Status: DC
Start: 2013-12-13 — End: 2013-12-13
  Administered 2013-12-13: 20 mg via ORAL
  Filled 2013-12-12: qty 1

## 2013-12-12 NOTE — H&P (Signed)
Date: 12/12/2013               Patient Name:  Mark [name] MRN: [id number]  DOB: 12/28/1950 Age / Sex: 63 y.o., male   PCP: Angelica Chessman, MD         Medical Service: Internal Medicine Teaching Service         Attending Physician: Dr. Ellwood Dense    First Contact: Dr. Heber Exeter Pager: [phone]  Second Contact: Dr. 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 He was discharged to SNF.  He reports that since his arrival at the SNF he has had poor PO intake, he reports this is because the food is pureed and he doesn't like it.  He reports that he has had increased generalized weakness as well as generalized pain ("all over").  He is a poor historian can has trouble giving a clear history.  ADDENDUM: Spoke with SNF, patient transferred to ED at family's request due to poor PO intake.  No SOB, fever, or significant productive cough noted in facility.  Meds: No current facility-administered medications for this encounter.   Current Outpatient Prescriptions  Medication Sig Dispense Refill  . aspirin EC 81 MG EC tablet Take 1 tablet (81 mg total) by mouth daily.      . cholecalciferol (VITAMIN D) 1000 UNITS tablet Take 1,000 Units by mouth daily.      Marland Kitchen docusate (COLACE) 50 MG/5ML liquid Take 5 mLs (50 mg total) by mouth daily.  921 mL  0  . folic acid (FOLVITE) 194 MCG tablet Take 400 mcg by mouth daily.      . hydrALAZINE (APRESOLINE) 10  MG tablet Take 1 tablet (10 mg total) by mouth 3 (three) times daily.  90 tablet  1  . lisinopril (PRINIVIL,ZESTRIL) 30 MG tablet Take 30 mg by mouth daily.      . methadone (DOLOPHINE) 10 MG tablet Take 50 mg by mouth daily. Take five tablets (50 mg) by mouth daily      . Multiple Vitamin (MULTIVITAMIN WITH MINERALS) TABS tablet Take 1 tablet by mouth daily.      . polyethylene glycol powder (GLYCOLAX/MIRALAX) powder Take 17 g by mouth 2 (two) times daily. Until daily soft stools  OTC  255 g  0  . pantoprazole (PROTONIX) 20 MG tablet Take 1 tablet (20 mg total) by mouth daily.  30 tablet  1    Allergies: Allergies as of 12/12/2013  . (No Known Allergies)   Past Medical History  Diagnosis Date  . Essential hypertension, benign 02/26/2013  . Liver lesion     a. Previously biopsy-proven to be a cyst (per records from a hospital in [place]).  . Hypoxia     a. Adm 02/2013, required home O2.  . Drug abuse   . Depression   . Seizure disorder     "after hip OR 02/2012 once I got home" (04/01/2013)  . GI bleed     a.  2009 at time of stroke - received 3 u blood, no source found per pt  . HTN (hypertension)   . On home oxygen therapy     2.5L "usually use it at night" (04/01/2013)  . Tuberculosis   . Positive TB test   . History of blood transfusion 2009    "don't know what it was related to" (04/01/2013)  . Stroke 2009    a. Pt reports several, last in 2009. b. Residual L sided weakness.  . Anxiety   . Adenocarcinoma, lung     a. Dx 2013, underwent left lower lobectomy under the care of Dr.Lapunzina in [place] for a stage I non-small cell lung CA.; "went to [facility] ~ 06/2012 and they couldn't find anything" (04/01/2013)  . Pneumonia   . Coronary atherosclerosis of native coronary artery    Past Surgical History  Procedure Laterality Date  . Hip arthroplasty Left 02/2012  . Lung biopsy Left 07/15/2012    "LLL" (04/01/2013)  . Lung removal, partial Left     /notes 02/26/2013  (04/01/2013)    Family History  Problem Relation Age of Onset  . Stroke    . CAD Neg Hx   . Hypertension Mother   . Hypertension Daughter    History   Social History  . Marital Status: Widowed    Spouse Name: N/A    Number of Children: N/A  . Years of Education: N/A   Occupational History  . Not on file.   Social History Main Topics  . Smoking status: Former Smoker -- 0.50 packs/day for 42 years    Types: Cigarettes    Quit date: 02/27/2012  . Smokeless tobacco: Never Used  . Alcohol Use: 29.4 oz/week    14 Cans of beer, 35 Shots of liquor per week     Comment: 04/01/2013 "~ 1/2 pint /day of rum plus 2 16oz beers/day"  . Drug Use: No     Comment: Prior h/o cocaine and heroin. Currently on methadone.  . Sexual Activity: Not Currently   Other Topics Concern  . Not on file   Social History Narrative  . No narrative on file    Review of Systems: Review of Systems  Constitutional: Negative for fever and malaise/fatigue.  HENT: Negative for congestion and sore throat.   Eyes: Negative for blurred vision and discharge.  Respiratory: Positive for cough (chronic, non productive, usually when he eats).   Cardiovascular: Negative for chest pain, palpitations and leg swelling.  Gastrointestinal: Positive for nausea. Negative for vomiting, abdominal pain, diarrhea, constipation and blood in stool.  Genitourinary: Negative for dysuria and frequency.  Musculoskeletal: Negative for falls and myalgias.  Neurological: Positive for weakness. Negative for dizziness, sensory change, focal weakness, seizures and headaches.  Psychiatric/Behavioral: The patient is not nervous/anxious.      Physical Exam: Blood pressure 165/114, pulse 92, temperature 97.6 F (36.4 C), temperature source Oral, resp. rate 10, SpO2 91.00%. Physical Exam  Nursing note and vitals reviewed. Constitutional: No distress.  Cachetic appearing   HENT:  Head: Normocephalic and atraumatic.  Eyes: EOM are normal. Pupils are  equal, round, and reactive to light.  Cardiovascular: Normal rate, regular rhythm and normal heart sounds.   Pulmonary/Chest: No respiratory distress. He has no wheezes.  Decreased Bibasilar breath sounds, Dullness to percussion over B/L bases  Abdominal: Soft. Bowel sounds are normal. He exhibits no distension. There is no tenderness.  Musculoskeletal: He exhibits no edema.  Neurological: He is alert. No cranial nerve  deficit.  Patient with difficulty answering orientation questions, oriented to self, initially he reported he was in a bank later changed to hospital, eventually did get correct date.  Skin: Skin is warm and dry. He is not diaphoretic.     Lab results: Basic Metabolic Panel:  Recent Labs  12/12/13 1546  NA 141  K 3.4*  CL 101  CO2 27  GLUCOSE 101*  BUN 19  CREATININE 0.99  CALCIUM 8.7  AG 13 Liver Function Tests:  Recent Labs  12/12/13 1546  AST 127*  ALT 87*  ALKPHOS 164*  BILITOT 2.1*  PROT 8.9*  ALBUMIN 2.6*    Recent Labs  12/12/13 1546  LIPASE 31   No results found for this basename: AMMONIA,  in the last 72 hours CBC:  Recent Labs  12/12/13 1546  WBC 8.8  NEUTROABS 6.5  HGB 10.3*  HCT 34.9*  MCV 66.5*  PLT 349   Cardiac Enzymes:  Recent Labs  12/12/13 1546  TROPONINI <0.30   BNP:  Recent Labs  12/12/13 1546  PROBNP 3564.0*   Urine Drug Screen: Drugs of Abuse     Component Value Date/Time   LABOPIA NONE DETECTED 11/22/2013 0702   COCAINSCRNUR NONE DETECTED 11/22/2013 0702   LABBENZ NONE DETECTED 11/22/2013 0702   AMPHETMU NONE DETECTED 11/22/2013 0702   THCU NONE DETECTED 11/22/2013 0702   LABBARB NONE DETECTED 11/22/2013 0702    Alcohol Level: No results found for this basename: ETH,  in the last 72 hours Urinalysis:  Recent Labs  12/12/13 1555  COLORURINE RED*  LABSPEC 1.029  PHURINE 6.0  GLUCOSEU NEGATIVE  HGBUR LARGE*  BILIRUBINUR MODERATE*  KETONESUR 15*  PROTEINUR 30*  UROBILINOGEN 4.0*  NITRITE  NEGATIVE  LEUKOCYTESUR SMALL*    Imaging results:  Dg Chest 2 View  12/12/2013   CLINICAL DATA:  Fatigue.  EXAM: CHEST  2 VIEW  COMPARISON:  November 23, 2013.  FINDINGS: Mild cardiomegaly is noted. Left pleural effusion is noted which is improved compared to prior exam. However, mild right pleural effusion is noted which is significantly increased compared to prior exam. Left perihilar and right infrahilar opacities are noted concerning for edema or possibly pneumonia. No pneumothorax is noted. Bony thorax is intact.  IMPRESSION: Left perihilar and right infrahilar opacities are noted concerning for edema or possibly pneumonia. Increased right pleural effusion is noted, with improved left pleural effusion.   Electronically Signed   By: Sabino Dick M.D.   On: 12/12/2013 15:14    Other results: EKG: NSR, normal axis, no acute ST or T wave changes prolonged Qtc at 522, unchanged from previous.  Assessment & Plan by Problem:  HCAP (healthcare-associated pneumonia) vs CHF exacerbation -Patient with cough and probably frequent aspirations, chest xray suggests possible pneumonia although patient is afrebrile in the ED but reported intermittent fevers at SNF from ED nursing note.  He does have a elevated ProBNP but is not hypoxic.  He does have pleural effusions which he had on last hospital admission, and his weight is down 24 lbs from discharge. - IV Vanc and Zosyn started in ED will continue for now. - will consider discont abx if we don't suspect pneumonia. Patient does not have any other features of pneumonia.  - Patient already has received Abx will not obtain blood cultures unless patient spikes a fever. -Monitor Strict I/O, daily weights. - consulted pulmonary to assist in his care given complex history. - Consider procalcitonin in AM - has appointment with Dr  Ramaswami of pulmonology on 12/24/2013  Bilateral Pleural Effusions, Rt >>Lt: Ddx include parapneumonic effusion versus CHF related  effusion. Daryll Brod - Had thoracentesis (1.2L drained) at last admission (11/23/13) with Light criteria consistent with transdudate, cytology neg for malignant cells.   - Patient continues to have pleural effusions, may consider therapeutic thoracentesis in AM. - consulted PCCP and they will evaluation in a.m - Patient may need dieuresis  Esophageal dysmotility/ Poor po intake/ Weight loss/ Severe protein calorie malnutrition. Esophageal barium swallow on 11/23/2013 >>Marked esophageal stasis. Tertiary  contractions associated with nonspecific esophageal dysmotility  Disorder. Recurrent pneumonia related to aspiration. No evidence of prior GI evaluation.  -Called and clarified with SNF that patient was sent to ED at familys request because of poor PO intake. - NPO until SLP eval - Patient weight down 24 lbs since hospital discharge - GI consulted on last admission, no indication for endoscopy. - Nutrition consult in AM. - Consider pallative consult in the AM.  Hypokalemia K 3.4, ordered 3 runs of IV potassium  Hx Lung stage I Adenocarcinoma s/p left partial lobectomy in may 2013. Seen by Dr Julien Nordmann in 02/2013 and recommended repeat PET but doubted recurrence. Has a follow up appointment on 12/15/2013 to discuss CT/PET. -Poor compliance with follow up.   - Pulmonology consulted on last admission, no indication for bronchoscopy and recommend repeat imaging in 6-12 months. - Given patient's deterioration and weight loss will consider re consulting pulmonology for potential bronchoscopy. - reschedule appt with oncology since he is likely to be in hospital  - keep appointment with PCCM on 12/24/2013  Elevated liver enzymes -Hep C Ab reactive on previous admission (no viral load) - Check Hep C viral load to determine if active Hep C infection - Consider Imaging of abdomen to eval for mets. (Intrahepatic lesion on previous CT was hypometabolic on PET scan in May)  Nausea - chronic issue. Likely related  to esophageal dysmotility.  - Lipase wnl, does have elevated liver enzymes (present on previous admission) -Paitent has prolonged QT will avoid Zofran will consider phenergan if needed  Hx of substance abuse - Per SNF patient has received methadone dose today, will resume methadone tomorrow. - Patient also has history of ETOH abuse.  Patient has not been receiving alcohol in SNF.  Will not initiate CIWA protocol at this time.  Dementia - Unclear etiology - Patient will need continue SNF care at discharge.  Prolonged QTc - Avoid QT prolong medications  Iron deficiency anemia -Hgb improved since discharge.  HTN -Continue home BP meds - Lasix 40mg  IV once  Hematuria - Possible UTI? Patient asymptomatic but has dementia  Diet: NPO DVT PPx: Heparin [place] Code Status: Full Dispo: Disposition is deferred at this time, awaiting improvement of current medical problems.   The patient does have a current PCP (Angelica Chessman, MD) and does not need an [facility] hospital follow-up appointment after discharge.  The patient does not know have transportation limitations that hinder transportation to clinic appointments.  Signed: Joni Reining, DO 12/12/2013, 5:55 PM

## 2013-12-12 NOTE — ED Notes (Signed)
Pts family at bedside right now and reporting that patient is a very difficult stick and that he has been stuck a lot before so they will allow [place] to only attempt to draw blood and obtain IV access x 2. [place], RN looked for access but was unable to locate a vein. No stick attempted as of yet. Will ask another Therapist, sports. Pts family also reports his is too lethargic and has been having episodes of incontinence. Order for in and out cath obtained.

## 2013-12-12 NOTE — ED Provider Notes (Signed)
CSN: [id number]     Arrival date & time 12/12/13  1344 History   First MD Initiated Contact with Patient 12/12/13 1350     Chief Complaint  Patient presents with  . Anorexia   Level V caveat to dementia (Consider location/radiation/quality/duration/timing/severity/associated sxs/prior Treatment) The history is provided by the patient and the EMS personnel.   patient reportedly has been eating and drinking less. He is at a rehabilitation after recent admission for pneumonia. He was seen in his new primary care doctor's office 2 days ago. No new imaging was done at that time. No blood work that time. Patient states she's been throwing up and having diarrhea for a while now. States he does have an appetite. He is a somewhat poor historian. States he has some abdominal pain.  Past Medical History  Diagnosis Date  . Essential hypertension, benign 02/26/2013  . Liver lesion     a. Previously biopsy-proven to be a cyst (per records from a hospital in [place]).  . Hypoxia     a. Adm 02/2013, required home O2.  . Drug abuse   . Depression   . Seizure disorder     "after hip OR 02/2012 once I got home" (04/01/2013)  . GI bleed     a. 2009 at time of stroke - received 3 u blood, no source found per pt  . HTN (hypertension)   . On home oxygen therapy     2.5L "usually use it at night" (04/01/2013)  . Tuberculosis   . Positive TB test   . History of blood transfusion 2009    "don't know what it was related to" (04/01/2013)  . Stroke 2009    a. Pt reports several, last in 2009. b. Residual L sided weakness.  . Anxiety   . Adenocarcinoma, lung     a. Dx 2013, underwent left lower lobectomy under the care of Dr.Lapunzina in [place] for a stage I non-small cell lung CA.; "went to [facility] ~ 06/2012 and they couldn't find anything" (04/01/2013)  . Pneumonia   . Coronary atherosclerosis of native coronary artery    Past Surgical History  Procedure Laterality Date  . Hip arthroplasty Left 02/2012  . Lung  biopsy Left 07/15/2012    "LLL" (04/01/2013)  . Lung removal, partial Left     /notes 02/26/2013  (04/01/2013)   Family History  Problem Relation Age of Onset  . Stroke    . CAD Neg Hx   . Hypertension Mother   . Hypertension Daughter    History  Substance Use Topics  . Smoking status: Former Smoker -- 0.50 packs/day for 42 years    Types: Cigarettes    Quit date: 02/27/2012  . Smokeless tobacco: Never Used  . Alcohol Use: 29.4 oz/week    14 Cans of beer, 35 Shots of liquor per week     Comment: 04/01/2013 "~ 1/2 pint /day of rum plus 2 16oz beers/day"    Review of Systems  Unable to perform ROS     Allergies  Review of patient's allergies indicates no known allergies.  Home Medications   Current Outpatient Rx  Name  Route  Sig  Dispense  Refill  . aspirin EC 81 MG EC tablet   Oral   Take 1 tablet (81 mg total) by mouth daily.         . cholecalciferol (VITAMIN D) 1000 UNITS tablet   Oral   Take 1,000 Units by mouth daily.         Marland Kitchen  docusate (COLACE) 50 MG/5ML liquid   Oral   Take 5 mLs (50 mg total) by mouth daily.   921 mL   0   . folic acid (FOLVITE) 194 MCG tablet   Oral   Take 400 mcg by mouth daily.         . hydrALAZINE (APRESOLINE) 10 MG tablet   Oral   Take 1 tablet (10 mg total) by mouth 3 (three) times daily.   90 tablet   1   . lisinopril (PRINIVIL,ZESTRIL) 30 MG tablet   Oral   Take 30 mg by mouth daily.         . methadone (DOLOPHINE) 10 MG tablet   Oral   Take 50 mg by mouth daily. Take five tablets (50 mg) by mouth daily         . Multiple Vitamin (MULTIVITAMIN WITH MINERALS) TABS tablet   Oral   Take 1 tablet by mouth daily.         . polyethylene glycol powder (GLYCOLAX/MIRALAX) powder   Oral   Take 17 g by mouth 2 (two) times daily. Until daily soft stools  OTC   255 g   0   . pantoprazole (PROTONIX) 20 MG tablet   Oral   Take 1 tablet (20 mg total) by mouth daily.   30 tablet   1    BP 157/109  Pulse 92   Temp(Src) 97.6 F (36.4 C) (Oral)  Resp 20  SpO2 100% Physical Exam  Constitutional: He appears well-developed.  Somewhat cachectic  HENT:  Head: Normocephalic.  Eyes: Pupils are equal, round, and reactive to light.  Cardiovascular: Normal rate.   Pulmonary/Chest: Effort normal and breath sounds normal.  Abdominal: Soft.  Musculoskeletal: Normal range of motion.  Neurological: He is alert.  Mild confusion.  Skin: Skin is warm.    ED Course  Procedures (including critical care time) Labs Review Labs Reviewed  CBC WITH DIFFERENTIAL  COMPREHENSIVE METABOLIC PANEL  LIPASE, BLOOD  URINALYSIS, ROUTINE W REFLEX MICROSCOPIC  TROPONIN I  PRO B NATRIURETIC PEPTIDE   Imaging Review Dg Chest 2 View  12/12/2013   CLINICAL DATA:  Fatigue.  EXAM: CHEST  2 VIEW  COMPARISON:  November 23, 2013.  FINDINGS: Mild cardiomegaly is noted. Left pleural effusion is noted which is improved compared to prior exam. However, mild right pleural effusion is noted which is significantly increased compared to prior exam. Left perihilar and right infrahilar opacities are noted concerning for edema or possibly pneumonia. No pneumothorax is noted. Bony thorax is intact.  IMPRESSION: Left perihilar and right infrahilar opacities are noted concerning for edema or possibly pneumonia. Increased right pleural effusion is noted, with improved left pleural effusion.   Electronically Signed   By: Sabino Dick M.D.   On: 12/12/2013 15:14    EKG Interpretation   None       MDM   Final diagnoses:  None    Patient with generalized weakness with NVD. Xray showed edema vs pneumonia. Labs pending. Will likely require admission.    Jasper Riling. Alvino Chapel, MD 12/12/13 1558

## 2013-12-12 NOTE — Progress Notes (Signed)
ANTIBIOTIC CONSULT NOTE - INITIAL  Pharmacy Consult for vancomycin/zosyn Indication: HCAP  No Known Allergies  Vital Signs: Temp: 97.6 F (36.4 C) (02/14 1358) Temp src: Oral (02/14 1358) BP: 114/92 mmHg (02/14 1815) Pulse Rate: 91 (02/14 1815) Intake/Output from previous day:   Intake/Output from this shift:    Labs:  Recent Labs  12/12/13 1546  WBC 8.8  HGB 10.3*  PLT 349  CREATININE 0.99   The CrCl is unknown because both a height and weight (above a minimum accepted value) are required for this calculation. No results found for this basename: VANCOTROUGH, VANCOPEAK, VANCORANDOM, GENTTROUGH, GENTPEAK, GENTRANDOM, TOBRATROUGH, TOBRAPEAK, TOBRARND, AMIKACINPEAK, AMIKACINTROU, AMIKACIN,  in the last 72 hours   Medical History: Past Medical History  Diagnosis Date  . Essential hypertension, benign 02/26/2013  . Liver lesion     a. Previously biopsy-proven to be a cyst (per records from a hospital in [place]).  . Hypoxia     a. Adm 02/2013, required home O2.  . Drug abuse   . Depression   . Seizure disorder     "after hip OR 02/2012 once I got home" (04/01/2013)  . GI bleed     a. 2009 at time of stroke - received 3 u blood, no source found per pt  . HTN (hypertension)   . On home oxygen therapy     2.5L "usually use it at night" (04/01/2013)  . Tuberculosis   . Positive TB test   . History of blood transfusion 2009    "don't know what it was related to" (04/01/2013)  . Stroke 2009    a. Pt reports several, last in 2009. b. Residual L sided weakness.  . Anxiety   . Adenocarcinoma, lung     a. Dx 2013, underwent left lower lobectomy under the care of Dr.Lapunzina in [place] for a stage I non-small cell lung CA.; "went to [facility] ~ 06/2012 and they couldn't find anything" (04/01/2013)  . Pneumonia   . Coronary atherosclerosis of native coronary artery       Assessment: 63 yo male with recent CAP and to begin vancomycin and zosyn for possible HCAP.  WBC= 8.8, afeb,  SCr= 0.99 and CrCl ~ 80.  Goal of Therapy:  Vancomycin trough level 15-20 mcg/ml  Plan:  -Zosyn 3.375gm IV q8h -Vancomycin 1000mg  IV q12h followed by 750mg  IV q12h -Will follow renal function, cultures and clinical progress  Hildred Laser, Pharm D 12/12/2013 7:23 PM

## 2013-12-12 NOTE — ED Notes (Addendum)
Pt reports to the ED via [facility] from [employer]. Pt reports that he has had decreased PO intake ever since he was sent to the SNF. Pt reports he has not been eating well because they make him eat pureed food and he dislikes it. Pt reports generalized pain of 7/10 in his joints that began last week. Denies any known injury or hx of arthritis. Pt was admitted for pneumonia and d/c on 1/31. Pt reports he feels SOB with exertion. Pt reports he is also still coughing up white phlegm. Pt also reports intermittent fevers. Diminished lung sounds in the bases. Pt has partial lobectomy from the left side. Pt 100% on RA. Pt A&Ox4, resp e/u, and skin warm and dry.

## 2013-12-13 DIAGNOSIS — I509 Heart failure, unspecified: Principal | ICD-10-CM

## 2013-12-13 DIAGNOSIS — N390 Urinary tract infection, site not specified: Secondary | ICD-10-CM | POA: Diagnosis present

## 2013-12-13 DIAGNOSIS — J9600 Acute respiratory failure, unspecified whether with hypoxia or hypercapnia: Secondary | ICD-10-CM

## 2013-12-13 DIAGNOSIS — J90 Pleural effusion, not elsewhere classified: Secondary | ICD-10-CM

## 2013-12-13 DIAGNOSIS — I5041 Acute combined systolic (congestive) and diastolic (congestive) heart failure: Secondary | ICD-10-CM

## 2013-12-13 LAB — URINALYSIS, ROUTINE W REFLEX MICROSCOPIC
Glucose, UA: NEGATIVE mg/dL
Ketones, ur: 15 mg/dL — AB
NITRITE: POSITIVE — AB
PH: 5.5 (ref 5.0–8.0)
Protein, ur: 100 mg/dL — AB
SPECIFIC GRAVITY, URINE: 1.026 (ref 1.005–1.030)
Urobilinogen, UA: 2 mg/dL — ABNORMAL HIGH (ref 0.0–1.0)

## 2013-12-13 LAB — GLUCOSE, CAPILLARY
Glucose-Capillary: 110 mg/dL — ABNORMAL HIGH (ref 70–99)
Glucose-Capillary: 191 mg/dL — ABNORMAL HIGH (ref 70–99)
Glucose-Capillary: 40 mg/dL — CL (ref 70–99)

## 2013-12-13 LAB — COMPREHENSIVE METABOLIC PANEL
ALT: 79 U/L — ABNORMAL HIGH (ref 0–53)
AST: 111 U/L — ABNORMAL HIGH (ref 0–37)
Albumin: 2.7 g/dL — ABNORMAL LOW (ref 3.5–5.2)
Alkaline Phosphatase: 164 U/L — ABNORMAL HIGH (ref 39–117)
BUN: 19 mg/dL (ref 6–23)
CALCIUM: 9.1 mg/dL (ref 8.4–10.5)
CO2: 24 mEq/L (ref 19–32)
Chloride: 106 mEq/L (ref 96–112)
Creatinine, Ser: 0.98 mg/dL (ref 0.50–1.35)
GFR calc non Af Amer: 86 mL/min — ABNORMAL LOW (ref >90)
GLUCOSE: 91 mg/dL (ref 70–99)
Potassium: 4.1 mEq/L (ref 3.7–5.3)
SODIUM: 148 meq/L — AB (ref 137–147)
TOTAL PROTEIN: 9.2 g/dL — AB (ref 6.0–8.3)
Total Bilirubin: 2.6 mg/dL — ABNORMAL HIGH (ref 0.3–1.2)

## 2013-12-13 LAB — CBC
HCT: 38.3 % — ABNORMAL LOW (ref 39.0–52.0)
HEMOGLOBIN: 11.5 g/dL — AB (ref 13.0–17.0)
MCH: 19.9 pg — AB (ref 26.0–34.0)
MCHC: 30 g/dL (ref 30.0–36.0)
MCV: 66.1 fL — ABNORMAL LOW (ref 78.0–100.0)
Platelets: 335 10*3/uL (ref 150–400)
RBC: 5.79 MIL/uL (ref 4.22–5.81)
RDW: 21.7 % — ABNORMAL HIGH (ref 11.5–15.5)
WBC: 7.9 10*3/uL (ref 4.0–10.5)

## 2013-12-13 LAB — URINE MICROSCOPIC-ADD ON

## 2013-12-13 LAB — MAGNESIUM: MAGNESIUM: 1.9 mg/dL (ref 1.5–2.5)

## 2013-12-13 MED ORDER — DEXTROSE 50 % IV SOLN
INTRAVENOUS | Status: AC
  Administered 2013-12-13: 50 mL
  Filled 2013-12-13: qty 50

## 2013-12-13 MED ORDER — LEVOFLOXACIN IN D5W 750 MG/150ML IV SOLN
750.0000 mg | INTRAVENOUS | Status: DC
  Filled 2013-12-13: qty 150

## 2013-12-13 MED ORDER — PANTOPRAZOLE SODIUM 40 MG PO TBEC
40.0000 mg | DELAYED_RELEASE_TABLET | Freq: Two times a day (BID) | ORAL | Status: DC
  Administered 2013-12-13: 40 mg via ORAL

## 2013-12-13 MED ORDER — NYSTATIN 100000 UNIT/ML MT SUSP
5.0000 mL | Freq: Four times a day (QID) | OROMUCOSAL | Status: DC
  Administered 2013-12-13 – 2013-12-15 (×9): 500000 [IU] via ORAL
  Filled 2013-12-13 (×11): qty 5

## 2013-12-13 MED ORDER — FLUCONAZOLE IN SODIUM CHLORIDE 400-0.9 MG/200ML-% IV SOLN
400.0000 mg | Freq: Once | INTRAVENOUS | Status: DC
  Filled 2013-12-13: qty 200

## 2013-12-13 MED ORDER — FUROSEMIDE 10 MG/ML IJ SOLN
40.0000 mg | Freq: Every day | INTRAMUSCULAR | Status: DC
  Administered 2013-12-13 – 2013-12-14 (×2): 40 mg via INTRAVENOUS
  Filled 2013-12-13 (×2): qty 4

## 2013-12-13 MED ORDER — DEXTROSE 50 % IV SOLN
1.0000 | INTRAVENOUS | Status: DC | PRN
  Administered 2013-12-14: 50 mL via INTRAVENOUS
  Filled 2013-12-13 (×2): qty 50

## 2013-12-13 NOTE — Progress Notes (Signed)
S: Called by nurse that patient had gross hematuria and was a little confused. Patient was seen and examined at the bedside. He denies pain, dysuria. He denies prior episodes of hematuria. He was in and out cathed in the ED at ~3pm for admission UA.  O: AVSS  Filed Vitals:   12/13/13 0012  BP: 158/116  Pulse: 88  Temp:   Resp:     Physical Exam  Constitutional: He is well-developed, well-nourished, and in no distress.  Cardiovascular: Normal rate, regular rhythm and normal heart sounds.  Exam reveals no gallop and no friction rub.   No murmur heard. Genitourinary:  Condom cath in place. ~200cc red urine with one large clot in collection bag. Some BRB noted about condom.  Neurological: He is alert. No cranial nerve deficit.  Oriented to name and year but not place (th[place]  Skin: Skin is warm and dry.  Psychiatric: Affect normal.    Urinalysis    Component Value Date/Time   COLORURINE RED* 12/12/2013 1555   APPEARANCEUR CLOUDY* 12/12/2013 1555   LABSPEC 1.029 12/12/2013 1555   PHURINE 6.0 12/12/2013 1555   GLUCOSEU NEGATIVE 12/12/2013 1555   HGBUR LARGE* 12/12/2013 1555   BILIRUBINUR MODERATE* 12/12/2013 1555   KETONESUR 15* 12/12/2013 1555   PROTEINUR 30* 12/12/2013 1555   UROBILINOGEN 4.0* 12/12/2013 1555   NITRITE NEGATIVE 12/12/2013 1555   LEUKOCYTESUR SMALL* 12/12/2013 1555    CBC    Component Value Date/Time   WBC 8.8 12/12/2013 1546   RBC 5.25 12/12/2013 1546   RBC 4.22 04/01/2013 0819   HGB 10.3* 12/12/2013 1546   HCT 34.9* 12/12/2013 1546   PLT 349 12/12/2013 1546   MCV 66.5* 12/12/2013 1546   MCH 19.6* 12/12/2013 1546   MCHC 29.5* 12/12/2013 1546   RDW 21.3* 12/12/2013 1546   LYMPHSABS 1.9 12/12/2013 1546   MONOABS 0.3 12/12/2013 1546   EOSABS 0.1 12/12/2013 1546   BASOSABS 0.0 12/12/2013 1546     A/P:  #Hematuria - UA from admission showed large Hgb, too-numerous-to-count RBCs. This was s/p in-and-out cath. Prior UAs (11/21/13, 11/06/13, 05/15/13) were  without blood. Serum Hgb 10.3 on admission which is improvement from prior measurements of ~9 last admission. AVSS. Suspect hematuria is from traumatic cath. UTI also a common cause of hematuria and ?Foley use at SNF, but pt denies dysuria. UA with small LE, neg nitrites.  - Repeat UA - Ordered urine culture - CBC at 5am - Continue to monitor I/Os - Continue condom cath  #Dementia - Patient appears to be at his baseline mental status per chart review of admission H&P as well as notes from prior admission. He has a history of dementia. EEG last admission showed mild generalized non-specific encephalopathy, no seizure or seizure predisposition. Neurologic exam was non-focal. He has a history of alcohol use but was admitted from SNF where he was not given alcohol, and has no tachycardia or agitation, so withdrawal is unlikely.  - Continue to monitor   Lesly Dukes, MD  [email or web address] Pager # [phone] Office # [phone]

## 2013-12-13 NOTE — Consult Note (Addendum)
Name: Riyan Haile MRN: [id number] DOB: Dec 23, 1950    ADMISSION DATE:  12/12/2013 CONSULTATION DATE:   12/13/13  REFERRING MD : Sandi Mealy PRIMARY SERVICE: same   CHIEF COMPLAINT:  Poor intake   BRIEF PATIENT DESCRIPTION:  63 yobm quit smoking 2012 with dx of transudative R Pleural effusion made on 1/26 readmitted with poor po intake 2/14 and noted to have R > L recurrent effusions and PCCM asked to eval am 2/15  SIGNIFICANT EVENTS / STUDIES:  Echo 04/01/13 - Left ventricle: The cavity size was normal. Wall thickness was normal. Systolic function was moderately reduced. The estimated ejection fraction was in the range of 35% to 40%. Diffuse hypokinesis. There is moderate hypokinesis of the basalinferolateral myocardium. Features are consistent with a pseudonormal left ventricular filling pattern, with concomitant abnormal relaxation and increased filling pressure (grade 2 diastolic dysfunction). - Mitral valve: Mild regurgitation. - Left atrium: The atrium was mildly dilated. - Right atrium: The atrium was mildly dilated. 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 He was discharged to SNF. He reports that since his arrival at the SNF he has had poor PO intake, he reports this is because the food is pureed and he doesn't like it. He reports that he has had increased generalized weakness as well as generalized pain ("all over"). He is a poor historian can has trouble giving a clear history.  ADDENDUM: Spoke with SNF,  patient transferred to ED at family's request due to poor PO intake. No SOB, fever, or significant productive cough noted in facility   Has mild doe and No obvious day to day or daytime variabilty or assoc chronic cough or cp or chest tightness, subjective wheeze overt sinus or hb symptoms. No unusual exp hx or h/o childhood pna/ asthma or knowledge of premature birth.  Sleeping ok without nocturnal  or early am exacerbation  of respiratory  c/o's or need for noct saba. Also denies any obvious fluctuation of symptoms with weather or environmental changes or other aggravating or alleviating factors except as outlined above   Current Medications, Allergies, Complete Past Medical History, Past Surgical History, Family History, and Social History were reviewed in [employer] record.  ROS  The following are not active complaints unless bolded sore throat, dysphagia, dental problems, itching, sneezing,  nasal congestion or excess/ purulent secretions, ear ache,   fever, chills, sweats, unintended wt loss, pleuritic or exertional cp, hemoptysis,  orthopnea pnd or leg swelling, presyncope, palpitations, heartburn, abdominal pain, anorexia, nausea, vomiting, diarrhea  or change in bowel or urinary habits, change in stools or urine, dysuria,hematuria,  rash, arthralgias, visual complaints, headache, numbness weakness generalized  or ataxia or problems with walking or coordination,  change in mood/affect or memory.       PAST MEDICAL HISTORY :  Past Medical History  Diagnosis Date  . Essential hypertension, benign 02/26/2013  . Liver lesion     a. Previously biopsy-proven to be a cyst (per records from a hospital in [place]).  Marland Kitchen  Hypoxia     a. Adm 02/2013, required home O2.  . Drug abuse   . Depression   . Seizure disorder     "after hip OR 02/2012 once I got home" (04/01/2013)  . GI bleed     a. 2009 at time of stroke - received 3 u blood, no source found per pt  . HTN  (hypertension)   . On home oxygen therapy     2.5L "usually use it at night" (04/01/2013)  . Tuberculosis   . Positive TB test   . History of blood transfusion 2009    "don't know what it was related to" (04/01/2013)  . Stroke 2009    a. Pt reports several, last in 2009. b. Residual L sided weakness.  . Anxiety   . Adenocarcinoma, lung     a. Dx 2013, underwent left lower lobectomy under the care of Dr.Lapunzina in [place] for a stage I non-small cell lung CA.; "went to [facility] ~ 06/2012 and they couldn't find anything" (04/01/2013)  . Pneumonia   . Coronary atherosclerosis of native coronary artery    Past Surgical History  Procedure Laterality Date  . Hip arthroplasty Left 02/2012  . Lung biopsy Left 07/15/2012    "LLL" (04/01/2013)  . Lung removal, partial Left     /notes 02/26/2013  (04/01/2013)   Prior to Admission medications   Medication Sig Start Date End Date Taking? Authorizing Provider  aspirin EC 81 MG EC tablet Take 1 tablet (81 mg total) by mouth daily. 03/04/13  Yes Erline Hau, MD  cholecalciferol (VITAMIN D) 1000 UNITS tablet Take 1,000 Units by mouth daily.   Yes Historical Provider, MD  docusate (COLACE) 50 MG/5ML liquid Take 5 mLs (50 mg total) by mouth daily. 11/27/13  Yes Jerene Pitch, MD  folic acid (FOLVITE) 858 MCG tablet Take 400 mcg by mouth daily.   Yes Historical Provider, MD  hydrALAZINE (APRESOLINE) 10 MG tablet Take 1 tablet (10 mg total) by mouth 3 (three) times daily. 11/06/13  Yes Montine Circle, PA-C  lisinopril (PRINIVIL,ZESTRIL) 30 MG tablet Take 30 mg by mouth daily.   Yes Historical Provider, MD  methadone (DOLOPHINE) 10 MG tablet Take 50 mg by mouth daily. Take five tablets (50 mg) by mouth daily   Yes Historical Provider, MD  Multiple Vitamin (MULTIVITAMIN WITH MINERALS) TABS tablet Take 1 tablet by mouth daily. 11/27/13  Yes Jerene Pitch, MD  polyethylene glycol powder (GLYCOLAX/MIRALAX) powder Take 17 g by mouth 2 (two) times daily. Until  daily soft stools  OTC 11/06/13  Yes Montine Circle, PA-C  pantoprazole (PROTONIX) 20 MG tablet Take 1 tablet (20 mg total) by mouth daily. 11/27/13   Jerene Pitch, MD   No Known Allergies  FAMILY HISTORY:  Family History  Problem Relation Age of Onset  . Stroke    . CAD Neg Hx   . Hypertension Mother   . Hypertension Daughter    SOCIAL HISTORY:  reports that he quit smoking about 21 months ago. His smoking use included Cigarettes. He has a 21 pack-year smoking history. He has never used smokeless tobacco. He reports that he drinks about 29.4 ounces of alcohol per week. He reports that he does not use illicit drugs.     SUBJECTIVE:  nad at 30 degrees HOB elevation on 2lpm NP  VITAL SIGNS: Temp:  [97.3 F (36.3 C)-97.6 F (36.4 C)] 97.5 F (36.4 C) (02/15 0616) Pulse Rate:  [87-99] 94 (02/15 0900) Resp:  [  10-29] 16 (02/15 0609) BP: (114-169)/(92-116) 159/100 mmHg (02/15 0900) SpO2:  [91 %-100 %] 95 % (02/15 0609) Weight:  [133 lb 4.8 oz (60.464 kg)] 133 lb 4.8 oz (60.464 kg) (02/15 0100) HEMODYNAMICS:   VENTILATOR SETTINGS:   INTAKE / OUTPUT: Intake/Output     02/14 0701 - 02/15 0700 02/15 0701 - 02/16 0700   IV Piggyback 350    Total Intake(mL/kg) 350 (5.8)    Urine (mL/kg/hr) 475 1900 (5.3)   Total Output 475 1900   Net -125 -1900          PHYSICAL EXAMINATION: General: chronically ill edlerly bm nad at 30 degrees HEENT mild turbinate edema.  Oropharynx no thrush or excess pnd or cobblestoning.  No JVD or cervical adenopathy. Mild accessory muscle hypertrophy. Trachea midline, nl thryroid. Chest was hyperinflated by percussion with dullness in both bases and  diminished breath sounds and moderate increased exp time without wheeze. Hoover sign positive at mid inspiration. Regular rate and rhythm without murmur gallop or rub or increase P2 or edema.  Abd: no hsm, nl excursion. Ext warm without cyanosis or clubbing.    LABS:  CBC  Recent Labs Lab 12/12/13 1546  12/13/13 1014  WBC 8.8 7.9  HGB 10.3* 11.5*  HCT 34.9* 38.3*  PLT 349 335   Coag's No results found for this basename: APTT, INR,  in the last 168 hours BMET  Recent Labs Lab 12/12/13 1546 12/13/13 1014  NA 141 148*  K 3.4* 4.1  CL 101 106  CO2 27 24  BUN 19 19  CREATININE 0.99 0.98  GLUCOSE 101* 91   Electrolytes  Recent Labs Lab 12/12/13 1546 12/13/13 1014  CALCIUM 8.7 9.1  MG  --  1.9   Sepsis Markers No results found for this basename: LATICACIDVEN, PROCALCITON, O2SATVEN,  in the last 168 hours ABG No results found for this basename: PHART, PCO2ART, PO2ART,  in the last 168 hours Liver Enzymes  Recent Labs Lab 12/12/13 1546 12/13/13 1014  AST 127* 111*  ALT 87* 79*  ALKPHOS 164* 164*  BILITOT 2.1* 2.6*  ALBUMIN 2.6* 2.7*   Cardiac Enzymes  Recent Labs Lab 12/12/13 1546  TROPONINI <0.30  PROBNP 3564.0*   Glucose No results found for this basename: GLUCAP,  in the last 168 hours  Imaging Dg Chest 2 View  12/12/2013   CLINICAL DATA:  Fatigue.  EXAM: CHEST  2 VIEW  COMPARISON:  November 23, 2013.  FINDINGS: Mild cardiomegaly is noted. Left pleural effusion is noted which is improved compared to prior exam. However, mild right pleural effusion is noted which is significantly increased compared to prior exam. Left perihilar and right infrahilar opacities are noted concerning for edema or possibly pneumonia. No pneumothorax is noted. Bony thorax is intact.  IMPRESSION: Left perihilar and right infrahilar opacities are noted concerning for edema or possibly pneumonia. Increased right pleural effusion is noted, with improved left pleural effusion.   Electronically Signed   By: Sabino Dick M.D.   On: 12/12/2013 15:14         ASSESSMENT / PLAN:  1) R > L effusion in pt with documented chf and low albumin complicated by hypoxemic /? 02 dep resp failure - sp R Tcentesis 11/23/13 with transudative features.  No indication for repeat thoracentesis at this  point - note pt is hypertensive adding to likelihood of further elevating the hydrostatic forces in setting on low serum oncotic pressure (a  Perfect storm).  rec consider adding aldactone  if bun/creat/K will allow    2) HBP in pt on methadone rec consider clonidine an excellent choice for hbp in this setting. Given his tendency to dysphagia I would avoid acei here and rx for GERD aggressively (reflux aggravates acei effects on the upper airway)   Christinia Gully, MD [facility] and [place] [phone] After 5:30 PM or weekends, call [phone]

## 2013-12-13 NOTE — Progress Notes (Signed)
Chart Note  I was made familiar with the patient's admission. Mr. Bowring has a history of early stage lung cancer S/P resection in [place]. I saw him one time in eary May 2014 for questionable disease recurrence. PET scan at that time did not show any clear evidence for disease recurrence. Pleural fluid tested in January 2015 showed reactive cells with no malignancy. He did not show up for any follow up visit since May 2014. He has an appointment scheduled with me on 12/15/13. I do not see a need for a formal consulation on this patient at this time during his hospitalization unless there is clear evidence for disease recurrence and even then this can be handled on outpatient basis.  I will be happy to see him in the clinic after discharge. Please call if you have any questions.

## 2013-12-13 NOTE — Progress Notes (Signed)
Full note to follow Imp: chf with bilateral effusions aggravated by low alb No indication for thoracentesis today rec pull neg ? Add aldactone / control bp ? Add clonidine since methadone dep    Christinia Gully, MD [facility] [place] [phone] After 5:30 PM or weekends, call [phone]

## 2013-12-13 NOTE — Progress Notes (Signed)
Pt. Took 10 a.m. meds and swallowed them with no problems or choking/coughing. Pt. Had trouble with his afternoon meds and coughed after swallowing his 1 pill. I had paged MD on call earlier to see if pt. Would have either a dysphagia diet order put in or put on nutritional fluids. MD was waiting on Speech to evaluate pt.'s ability to swallow. Order had been in since 10:30 p.m. Last night. I paged Speech to see if pt. Would be seen but was told the Speech evaluator had left for the day. Pt. Remained NPO for remainder of my shift except for sips with meds.

## 2013-12-13 NOTE — Progress Notes (Signed)
Subjective: Overnight team was paged that patient had gross hermaturia.  A U/A and culture was obtained, Nitrates now positive.  Patient continues to deny urinary complaints. He has no acute issues and no current complaints.   Patient does report that his daughter is his medical decision maker and would like for me to give her a call this afternoon. Objective: Vital signs in last 24 hours: Filed Vitals:   12/13/13 0606 12/13/13 0609 12/13/13 0612 12/13/13 0616  BP: 159/113 159/104 152/99 169/111  Pulse: 97 99    Temp:    97.5 F (36.4 C)  TempSrc:      Resp: 14 16    Weight:      SpO2: 98% 95%     Weight change:   Intake/Output Summary (Last 24 hours) at 12/13/13 0830 Last data filed at 12/13/13 0616  Gross per 24 hour  Intake    350 ml  Output    475 ml  Net   -125 ml   General: resting in bed HEENT: PERRL, EOMI, no scleral icterus, white patches on palate  Cardiac: RRR, no rubs, murmurs or gallops Pulm: decreased breath sounds over right middle and lower lobe, decreased over left base. No wheezing. Abd: soft, nontender, nondistended, BS present GU: Condom cath in place, good urine output, no gross hematuria currently Ext: warm and well perfused, no pedal edema Neuro: alert and oriented X2 (not clear about location), cranial nerves II-XII grossly intact   Lab Results: Basic Metabolic Panel:  Recent Labs Lab 12/12/13 1546  NA 141  K 3.4*  CL 101  CO2 27  GLUCOSE 101*  BUN 19  CREATININE 0.99  CALCIUM 8.7   Liver Function Tests:  Recent Labs Lab 12/12/13 1546  AST 127*  ALT 87*  ALKPHOS 164*  BILITOT 2.1*  PROT 8.9*  ALBUMIN 2.6*    Recent Labs Lab 12/12/13 1546  LIPASE 31   No results found for this basename: AMMONIA,  in the last 168 hours CBC:  Recent Labs Lab 12/12/13 1546  WBC 8.8  NEUTROABS 6.5  HGB 10.3*  HCT 34.9*  MCV 66.5*  PLT 349   Cardiac Enzymes:  Recent Labs Lab 12/12/13 1546  TROPONINI <0.30   BNP:  Recent  Labs Lab 12/12/13 1546  PROBNP 3564.0*   Urine Drug Screen: Drugs of Abuse     Component Value Date/Time   LABOPIA NONE DETECTED 11/22/2013 0702   COCAINSCRNUR NONE DETECTED 11/22/2013 0702   LABBENZ NONE DETECTED 11/22/2013 0702   AMPHETMU NONE DETECTED 11/22/2013 0702   THCU NONE DETECTED 11/22/2013 0702   LABBARB NONE DETECTED 11/22/2013 0702    Alcohol Level: No results found for this basename: ETH,  in the last 168 hours Urinalysis:  Recent Labs Lab 12/12/13 1555 12/13/13 0058  COLORURINE RED* RED*  LABSPEC 1.029 1.026  PHURINE 6.0 5.5  GLUCOSEU NEGATIVE NEGATIVE  HGBUR LARGE* LARGE*  BILIRUBINUR MODERATE* LARGE*  KETONESUR 15* 15*  PROTEINUR 30* 100*  UROBILINOGEN 4.0* 2.0*  NITRITE NEGATIVE POSITIVE*  LEUKOCYTESUR SMALL* MODERATE*    Micro Results: No results found for this or any previous visit (from the past 240 hour(s)). Studies/Results: Dg Chest 2 View  12/12/2013   CLINICAL DATA:  Fatigue.  EXAM: CHEST  2 VIEW  COMPARISON:  November 23, 2013.  FINDINGS: Mild cardiomegaly is noted. Left pleural effusion is noted which is improved compared to prior exam. However, mild right pleural effusion is noted which is significantly increased compared to prior exam. Left  perihilar and right infrahilar opacities are noted concerning for edema or possibly pneumonia. No pneumothorax is noted. Bony thorax is intact.  IMPRESSION: Left perihilar and right infrahilar opacities are noted concerning for edema or possibly pneumonia. Increased right pleural effusion is noted, with improved left pleural effusion.   Electronically Signed   By: Sabino Dick M.D.   On: 12/12/2013 15:14   Medications: I have reviewed the patient's current medications. Scheduled Meds: . aspirin EC  81 mg Oral Daily  . enoxaparin (LOVENOX) injection  30 mg Subcutaneous Q24H  . furosemide  40 mg Intravenous Daily  . methadone  50 mg Oral Daily  . pantoprazole  20 mg Oral Daily  . piperacillin-tazobactam  (ZOSYN)  IV  3.375 g Intravenous Q8H  . sodium chloride  3 mL Intravenous Q12H  . vancomycin  750 mg Intravenous Q12H   Continuous Infusions:  PRN Meds:.hydrALAZINE Assessment/Plan: CHF exacerbation, less likely HCAP (healthcare-associated pneumonia) - IV Vanc and Zosyn started in ED will stop Vanc as MRSA unlikely given clinical status. -Monitor Strict I/O, daily weights. (Neg 1.9 L since admission) - consulted pulmonary to assist in his care given complex history. Appreciate recs (no indication for thoracentesis at this time) - Lasix 40mg  IV daily  Bilateral Pleural Effusions, Rt >>Lt: Most likely secondary to CHF exacerbation. - Had thoracentesis (1.2L drained) at last admission (11/23/13) with Light criteria consistent with transdudate, cytology neg for malignant cells.  - Continue diuresis, monitor output.  Esophageal dysmotility/ Poor po intake/ Weight loss/ Severe protein calorie malnutrition. Esophageal barium swallow on 11/23/2013 >>Marked esophageal stasis. Tertiary contractions associated with nonspecific esophageal dysmotility  Disorder.  - NPO until SLP eval  - Nutrition consult after SLP eval - pallative consult.  Hypernatremia - Na 148, this AM, likely secondary to volume overload from CHF. 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 Seen by Dr Julien Nordmann in 02/2013 and recommended repeat PET but doubted recurrence. Has a follow up appointment on 12/15/2013 to discuss CT/PET.  - Oncology consulted, Dr. Julien Nordmann will see tomorrow. - His weight loss is very concerning for possible metastasis.  Possible UTI - U/A suggestive with hematuria, bacteria, and nitrites positive.  Patient denies any symptoms but has a history of dementia.  Patient is currently receiving broad spectrum antibiotics that would treat a UTI.  Elevated liver enzymes  -Hep C Ab reactive on previous admission (no viral load)  - Check Hep C  viral load to determine if active Hep C infection  - Consider Imaging of abdomen to eval for mets. (Intrahepatic lesion on previous CT was hypometabolic on PET scan in May)   Oral Candidiasis - Will give Nystatin once passes SLP eval.  Will try to avoid Fluconazole given prolonged QTc.  Nausea - chronic issue. Likely related to esophageal dysmotility.  - Lipase wnl, does have elevated liver enzymes (present on previous admission)  -Paitent has prolonged QT will avoid Zofran will consider phenergan if needed   Hx of substance abuse  - Per SNF patient has received methadone dose today, will resume methadone tomorrow.  - Patient may need clonidine for HTN with substance abuse but will try to avoid given his potential adverse reaction on last admission  Dementia  - Unclear etiology  - Patient will need continue SNF care at discharge.   Prolonged QTc  - Avoid QT prolong medications   Iron deficiency anemia  -Hgb improved since discharge.  HTN  -Continue home BP meds  - Lasix 40mg  IV daily  Hypokalemia -resolved - Mag wnl  Diet: NPO  DVT PPx: Heparin [place]  Code Status: DNR, >>> spoke to Patient's daughter Cathlyn Parsons [phone]) but will allow for intubation at this time (she reports they have not discussed that but did recently discuss CPR).  She reports she has seen a dramatic decline of her father since his Lung surgery that seems to have accelerated recently, she would like a palliative care consult for help with goals of care.  Dispo: Disposition is deferred at this time, awaiting improvement of current medical problems.  Anticipated discharge in approximately 2 day(s).   The patient does have a current PCP (Angelica Chessman, MD) and does not need an [facility] hospital follow-up appointment after discharge.  The patient does not have transportation limitations that hinder transportation to clinic appointments.  .Services Needed at time of discharge: Y = Yes, Blank = No PT:   OT:    RN:   Equipment:   Other:     LOS: 1 day   Joni Reining, DO 12/13/2013, 8:30 AM

## 2013-12-13 NOTE — H&P (Signed)
INTERNAL MEDICINE TEACHING ATTENDING NOTE  Day 1 of stay  Patient name: Mark [name]  MRN: [id number] Date of birth: 1951/09/29   63 y.o.male with past history of HTN, CHF (03/2013 EF 22-44% with diastolic dysfunction), lung ca s/p LL partial lobectomy in 2009, and esophageal dysmotility, adn dementia, recently admitted for pneumonia. discharged on 11/28/12. Admitted for poor PO intake and found to chest Xray with increased pleural effusions (R>L) and possibility of pneumonia.    I have reviewed the relevant medical history of this patient, labs, imaging, EKG.   Filed Vitals:   12/13/13 0612 12/13/13 0616 12/13/13 0857 12/13/13 0900  BP: 152/99 169/111 156/114 159/100  Pulse:   97 94  Temp:  97.5 F (36.4 C)    TempSrc:      Resp:      Weight:      SpO2:        Today the patient was oriented for me times three, and was making sensible conversation. His exam was significant for white spots on tongue, decreased breath sounds at bases, and few rales on right lower and left mid lung. No edema LLE and good pulses. Heart normal sounds RRR, no murmur.     Assessment and Plan   Dubious chest X ray finding for pneumonia coupled with SNF reported fever and co-morbidities, I agree for starting treatment for HCAP at this time, however will narrow if vitals remain stable for 24-48 hours and no other evidence of pneumonia.  Pleural effusions - likely transudates like before - CHF, hypoalbumenimeia. Not hypoxic at present. Lasix 40 mg daily for 2 days can be tried.    UTI - Lung antibiotics will cover UTI. Urine culture to be sent, sensitivities awaited.   Hematuria - to monitor. If continues, consult urology. Check CBC.   Weight loss - Loss of significant weight 24 lbs in 1 month, in the setting on lung malignancy. We will touch base with his oncologist Dr. Inda Merlin regarding repeating PET.   Hypoalbuminemia - Secondary to malnutrition. Consult nutrition.   Swallow evaluation to be done before  the patient can eat.   Deranged liver panel - chronic. HCV Ab reactive, agree with getting viral rna. This could also be due to the possible pathology (left liver lobe lesion which was not hypermetabolic on PET). A repeat Ct scan of the abdomen pelvis can be done to re-assess (last scan 03/17/13).    I have seen and evaluated this patient and discussed it with my IM resident team.  Please see the rest of the plan per resident note from today.   [place], [place] 12/13/2013, 1:25 PM.

## 2013-12-14 DIAGNOSIS — E43 Unspecified severe protein-calorie malnutrition: Secondary | ICD-10-CM | POA: Insufficient documentation

## 2013-12-14 LAB — URINALYSIS, ROUTINE W REFLEX MICROSCOPIC
Bilirubin Urine: NEGATIVE
Glucose, UA: NEGATIVE mg/dL
Hgb urine dipstick: NEGATIVE
Ketones, ur: NEGATIVE mg/dL
LEUKOCYTES UA: NEGATIVE
Nitrite: NEGATIVE
PROTEIN: NEGATIVE mg/dL
SPECIFIC GRAVITY, URINE: 1.008 (ref 1.005–1.030)
UROBILINOGEN UA: 1 mg/dL (ref 0.0–1.0)
pH: 6.5 (ref 5.0–8.0)

## 2013-12-14 LAB — URINE CULTURE

## 2013-12-14 LAB — BASIC METABOLIC PANEL
BUN: 18 mg/dL (ref 6–23)
CALCIUM: 8.7 mg/dL (ref 8.4–10.5)
CHLORIDE: 107 meq/L (ref 96–112)
CO2: 31 mEq/L (ref 19–32)
CREATININE: 1.2 mg/dL (ref 0.50–1.35)
GFR calc Af Amer: 73 mL/min — ABNORMAL LOW (ref >90)
GFR, EST NON AFRICAN AMERICAN: 63 mL/min — AB (ref >90)
Glucose, Bld: 67 mg/dL — ABNORMAL LOW (ref 70–99)
Potassium: 3.5 mEq/L — ABNORMAL LOW (ref 3.7–5.3)
Sodium: 147 mEq/L (ref 137–147)

## 2013-12-14 LAB — CBC
HEMATOCRIT: 34 % — AB (ref 39.0–52.0)
Hemoglobin: 9.7 g/dL — ABNORMAL LOW (ref 13.0–17.0)
MCH: 19.2 pg — ABNORMAL LOW (ref 26.0–34.0)
MCHC: 28.5 g/dL — AB (ref 30.0–36.0)
MCV: 67.3 fL — ABNORMAL LOW (ref 78.0–100.0)
PLATELETS: 360 10*3/uL (ref 150–400)
RBC: 5.05 MIL/uL (ref 4.22–5.81)
RDW: 21.9 % — ABNORMAL HIGH (ref 11.5–15.5)
WBC: 9.3 10*3/uL (ref 4.0–10.5)

## 2013-12-14 LAB — GLUCOSE, CAPILLARY
GLUCOSE-CAPILLARY: 68 mg/dL — AB (ref 70–99)
GLUCOSE-CAPILLARY: 69 mg/dL — AB (ref 70–99)
Glucose-Capillary: 96 mg/dL (ref 70–99)
Glucose-Capillary: 142 mg/dL — ABNORMAL HIGH (ref 70–99)
Glucose-Capillary: 105 mg/dL — ABNORMAL HIGH (ref 70–99)

## 2013-12-14 MED ORDER — POTASSIUM CHLORIDE CRYS ER 20 MEQ PO TBCR
40.0000 meq | EXTENDED_RELEASE_TABLET | Freq: Once | ORAL | Status: AC
  Administered 2013-12-14: 40 meq via ORAL
  Filled 2013-12-14: qty 2

## 2013-12-14 MED ORDER — ENSURE COMPLETE PO LIQD
237.0000 mL | Freq: Two times a day (BID) | ORAL | Status: DC
  Administered 2013-12-14 – 2013-12-15 (×2): 237 mL via ORAL

## 2013-12-14 MED ORDER — SODIUM CHLORIDE 0.45 % IV SOLN
INTRAVENOUS | Status: DC
  Administered 2013-12-14: 09:00:00 via INTRAVENOUS

## 2013-12-14 MED ORDER — ENSURE PUDDING PO PUDG
1.0000 | Freq: Two times a day (BID) | ORAL | Status: DC
  Administered 2013-12-14 – 2013-12-15 (×3): 1 via ORAL

## 2013-12-14 MED ORDER — FUROSEMIDE 40 MG PO TABS: 40.0000 mg | ORAL_TABLET | Freq: Every day | ORAL | Status: DC

## 2013-12-14 MED ORDER — PANTOPRAZOLE SODIUM 40 MG IV SOLR
40.0000 mg | Freq: Two times a day (BID) | INTRAVENOUS | Status: DC
  Administered 2013-12-14 – 2013-12-15 (×3): 40 mg via INTRAVENOUS
  Filled 2013-12-14 (×4): qty 40

## 2013-12-14 NOTE — Progress Notes (Signed)
Palliative Medicine Consult received. We will schedule a goals of care meeting with this patient and his family at the earliest possible time we have a provider available.  Lane Hacker, DO Palliative Medicine

## 2013-12-14 NOTE — Evaluation (Signed)
Physical Therapy Evaluation Patient Details Name: Mark [name] MRN: [id number] DOB: [birth year] Today's Date: 12/14/2013 Time: 4034-7425 PT Time Calculation (min): 29 min  PT Assessment / Plan / Recommendation History of Present Illness  63 y.o.male with past history of HTN, CHF (03/2013 EF 95-63% with diastolic dysfunction), lung ca s/p LL partial lobectomy in 2009, and esophageal dysmotility, adn dementia, recently admitted for pneumonia. discharged on 11/28/12. Admitted for poor PO intake and found to chest Xray with increased pleural effusions (R>L) and possibility of pneumonia  Clinical Impression  Pt pleasantly confused with decreased awareness, attention and orientation. Pt states he wants to return home with family assist 24hrs/day but no family present to confirm this level of assist. Recommend return to sNF to maximize safety, balance, gait and cognition. Pt with below deficits and will benefit from acute therapy to maximize mobility, strength and function to decrease burden of care.     PT Assessment  Patient needs continued PT services    Follow Up Recommendations  SNF;Supervision/Assistance - 24 hour    Does the patient have the potential to tolerate intense rehabilitation      Barriers to Discharge Decreased caregiver support      Equipment Recommendations  Rolling walker with 5" wheels    Recommendations for Other Services     Frequency Min 2X/week    Precautions / Restrictions Precautions Precautions: Fall   Pertinent Vitals/Pain No pain sats 90-94% on 3L although difficult to achieve reading during gait HR 94-104      Mobility  Bed Mobility Overal bed mobility: Needs Assistance Bed Mobility: Supine to Sit Supine to sit: Supervision General bed mobility comments: cues to initiate mobility but no physical assist Transfers Overall transfer level: Needs assistance Equipment used: Rolling walker (2 wheeled) Transfers: Sit to/from Stand Sit to Stand: Min  guard General transfer comment: cues for hand placement  Ambulation/Gait Ambulation/Gait assistance: Min guard Ambulation Distance (Feet): 125 Feet Assistive device: Rolling walker (2 wheeled) Gait Pattern/deviations: Step-through pattern;Decreased stride length;Trunk flexed;Narrow base of support Gait velocity interpretation: <1.8 ft/sec, indicative of risk for recurrent falls General Gait Details: no LoB but cues to step into RW, extend trunk and directional cues to room    Exercises General Exercises - Lower Extremity Long Arc Quad: AROM;Seated;Both;10 reps Hip Flexion/Marching: AROM;Seated;Both;10 reps   PT Diagnosis: Difficulty walking;Generalized weakness  PT Problem List: Decreased strength;Decreased range of motion;Decreased activity tolerance;Decreased balance;Decreased mobility;Decreased coordination;Decreased cognition;Decreased knowledge of use of DME;Decreased safety awareness PT Treatment Interventions: DME instruction;Gait training;Functional mobility training;Therapeutic activities;Therapeutic exercise;Balance training;Patient/family education;Cognitive remediation;Neuromuscular re-education     PT Goals(Current goals can be found in the care plan section) Acute Rehab PT Goals Patient Stated Goal: pt states he wants to go home with dgtr PT Goal Formulation: With patient Time For Goal Achievement: 12/28/13 Potential to Achieve Goals: Fair  Visit Information  Last PT Received On: 12/14/13 Assistance Needed: +1 History of Present Illness: 63 y.o.male with past history of HTN, CHF (03/2013 EF 87-56% with diastolic dysfunction), lung ca s/p LL partial lobectomy in 2009, and esophageal dysmotility, adn dementia, recently admitted for pneumonia. discharged on 11/28/12. Admitted for poor PO intake and found to chest Xray with increased pleural effusions (R>L) and possibility of pneumonia       Prior Functioning  Home Living Family/patient expects to be discharged to:: Skilled  nursing facility Prior Function Level of Independence: Needs assistance Gait / Transfers Assistance Needed: with cane per pt ADL's / Homemaking Assistance Needed: assist for bathing  Communication Communication:  No difficulties Dominant Hand: Right    Cognition  Cognition Arousal/Alertness: Awake/alert Behavior During Therapy: Flat affect Overall Cognitive Status: No family/caregiver present to determine baseline cognitive functioning Area of Impairment: Orientation Orientation Level: Disoriented to;Place;Time;Situation Memory: Decreased short-term memory Following Commands: Follows one step commands consistently Safety/Judgement: Decreased awareness of deficits;Decreased awareness of safety Problem Solving: Slow processing;Decreased initiation;Requires verbal cues;Requires tactile cues General Comments: pt repeatedly stating he is in the doctors office or lawyers office    Extremity/Trunk Assessment Upper Extremity Assessment Upper Extremity Assessment: Generalized weakness Lower Extremity Assessment Lower Extremity Assessment: Generalized weakness Cervical / Trunk Assessment Cervical / Trunk Assessment: Normal   Balance    End of Session PT - End of Session Equipment Utilized During Treatment: Gait belt;Oxygen Activity Tolerance: Patient tolerated treatment well Patient left: in chair;with call bell/phone within reach Nurse Communication: Mobility status  GP     Lanetta Inch Beth 12/14/2013, 2:07 PM Elwyn Reach, [place]

## 2013-12-14 NOTE — Progress Notes (Signed)
Patient refusing vitals.  Patient agitated, condom cath removed by patient. Bed alarm on.  RN will continue to monitor. Shellee Milo, RN

## 2013-12-14 NOTE — Progress Notes (Signed)
INITIAL NUTRITION ASSESSMENT  DOCUMENTATION CODES Per approved criteria  -Severe malnutrition in the context of chronic illness  Pt meets criteria for SEVERE MALNUTRITION in the context of CHRONIC Illness as evidenced by 15% weight loss in less than one month and signs of severe muscle wasting in physical exam.  INTERVENTION: Provide Ensure Complete po BID, each supplement provides 350 kcal and 13 grams of protein Provide Ensure Pudding po BID, each supplement provides 170 kcal and 4 grams of protein Encourage PO intake Magic Cup with meals as desired by pt RD to continue to monitor nutrition care plan  NUTRITION DIAGNOSIS: Inadequate oral intake related to dysphagia as evidenced by 15% weight loss in less than one month.   Goal: Pt to meet >/= 90% of their estimated nutrition needs   Monitor:  PO intake, diet order, weight trends, labs  Reason for Assessment: Consult  63 y.o. male  Admitting Dx: HCAP (healthcare-associated pneumonia)  ASSESSMENT: 63 yo AA male with PMH of combined heart failure (last echo 03/2013 EF 35-40%), Lung adenocarcinoma s/p left lower partial lobectomy in 2009, did not require chemo or radiotherapy, prev GI bleed, HTN, substance abuse (on methadone), he was recently admitted and treated for community acquired vs aspiration pneumonia (1/24-1/31) and found to have esophageal dysmotility. He was discharged to SNF. He reports that since his arrival at the SNF he has had poor PO intake, he reports this is because the food is pureed and he doesn't like it. He reports that he has had increased generalized weakness as well as generalized pain ("all over").  Pt reports that he usually weighs 174 lbs and has lost 20 lbs in the past 2 months. Weight history shows that pt wast weighing 152 lbs and had a 23 lb weight loss (15% of weight) within the past one month. Pt states he has not been eating because he doesn't like the pureed foods diet that he was put on. Per pt's  daughter pt was only eating a bite or 2 at each meal for the past 2 weeks. Pt states he likes Ensure supplements.   Nutrition Focused Physical Exam:  Subcutaneous Fat:  Orbital Region: wnl Upper Arm Region: moderate/severe wasting Thoracic and Lumbar Region: NA  Muscle:  Temple Region: moderate wasting Clavicle Bone Region: moderate wasting Clavicle and Acromion Bone Region: mild wasting Scapular Bone Region: NA Dorsal Hand: mild wasting Patellar Region: severe wasting Anterior Thigh Region: severe wasting Posterior Calf Region: severe wasting  Edema: none   Height: Ht Readings from Last 1 Encounters:  12/10/13 5\' 7"  (1.702 m)    Weight: Wt Readings from Last 1 Encounters:  12/14/13 129 lb 3 oz (58.6 kg)    Ideal Body Weight: 148 lbs  % Ideal Body Weight: 87%  Wt Readings from Last 10 Encounters:  12/14/13 129 lb 3 oz (58.6 kg)  12/10/13 120 lb (54.432 kg)  11/28/13 144 lb 2.9 oz (65.4 kg)  11/18/13 152 lb (68.947 kg)  11/06/13 152 lb (68.947 kg)  06/01/13 147 lb 12.8 oz (67.042 kg)  05/12/13 153 lb (69.4 kg)  04/05/13 143 lb 8 oz (65.091 kg)  04/05/13 143 lb 8 oz (65.091 kg)  03/24/13 154 lb (69.854 kg)    Usual Body Weight: 154 lbs  % Usual Body Weight: 84%  BMI:  Body mass index is 20.23 kg/(m^2).  Estimated Nutritional Needs: Kcal: 7846-9629 Protein: 80-90 grams Fluid: 1.8- 2 L/day  Skin: intact  Diet Order: Dysphagia  EDUCATION NEEDS: -No education needs identified  at this time   Intake/Output Summary (Last 24 hours) at 12/14/13 1120 Last data filed at 12/14/13 0905  Gross per 24 hour  Intake      0 ml  Output   3425 ml  Net  -3425 ml    Last BM: 2/15  Labs:   Recent Labs Lab 12/12/13 1546 12/13/13 1014 12/14/13 0730  NA 141 148* 147  K 3.4* 4.1 3.5*  CL 101 106 107  CO2 27 24 31   BUN 19 19 18   CREATININE 0.99 0.98 1.20  CALCIUM 8.7 9.1 8.7  MG  --  1.9  --   GLUCOSE 101* 91 67*    CBG (last 3)   Recent Labs   12/13/13 2331 12/14/13 0305 12/14/13 0604  GLUCAP 110* 68* 96    Scheduled Meds: . aspirin EC  81 mg Oral Daily  . enoxaparin (LOVENOX) injection  30 mg Subcutaneous Q24H  . furosemide  40 mg Intravenous Daily  . methadone  50 mg Oral Daily  . nystatin  5 mL Oral QID  . pantoprazole (PROTONIX) IV  40 mg Intravenous Q12H  . sodium chloride  3 mL Intravenous Q12H    Continuous Infusions: . sodium chloride 75 mL/hr at 12/14/13 0841    Past Medical History  Diagnosis Date  . Essential hypertension, benign 02/26/2013  . Liver lesion     a. Previously biopsy-proven to be a cyst (per records from a hospital in [place]).  . Hypoxia     a. Adm 02/2013, required home O2.  . Drug abuse   . Depression   . Seizure disorder     "after hip OR 02/2012 once I got home" (04/01/2013)  . GI bleed     a. 2009 at time of stroke - received 3 u blood, no source found per pt  . HTN (hypertension)   . On home oxygen therapy     2.5L "usually use it at night" (04/01/2013)  . Tuberculosis   . Positive TB test   . History of blood transfusion 2009    "don't know what it was related to" (04/01/2013)  . Stroke 2009    a. Pt reports several, last in 2009. b. Residual L sided weakness.  . Anxiety   . Adenocarcinoma, lung     a. Dx 2013, underwent left lower lobectomy under the care of Dr.Lapunzina in [place] for a stage I non-small cell lung CA.; "went to [facility] ~ 06/2012 and they couldn't find anything" (04/01/2013)  . Pneumonia   . Coronary atherosclerosis of native coronary artery     Past Surgical History  Procedure Laterality Date  . Hip arthroplasty Left 02/2012  . Lung biopsy Left 07/15/2012    "LLL" (04/01/2013)  . Lung removal, partial Left     Archie Endo 02/26/2013  (04/01/2013)    Pryor Ochoa RD, LDN Inpatient Clinical Dietitian Pager: [phone] After Hours Pager: [phone]

## 2013-12-14 NOTE — Progress Notes (Signed)
INTERNAL MEDICINE TEACHING SERVICE DAILY PROGRESS NOTE  Subjective: Patient more active this morning, daughter and son at bedside.  Patient reports he is hungry to eat, has no other complaints. Patient's code status was confirmed with daughter, son and patient. Daughter reports she would like [name] to return home with her if possible with home health and PT.  She does not think [name] likes being at [facility], she reports she understands he may be at greater risk given his risk for aspiration but reports he would be come comfortable. Objective: Vital signs in last 24 hours: Filed Vitals:   12/13/13 2027 12/14/13 0610 12/14/13 1401 12/14/13 1411  BP: 159/108 142/91  135/85  Pulse: 89 95 94 91  Temp: 97.6 F (36.4 C) 97.8 F (36.6 C)  98.1 F (36.7 C)  TempSrc: Oral Oral  Oral  Resp: 18 18  18   Weight:  129 lb 3 oz (58.6 kg)    SpO2: 95% 100% 94% 100%   Weight change: -4 lb 1.8 oz (-1.865 kg)  Intake/Output Summary (Last 24 hours) at 12/14/13 1533 Last data filed at 12/14/13 1500  Gross per 24 hour  Intake 803.75 ml  Output   2525 ml  Net -1721.25 ml   General: resting in bed HEENT: EOMI, no scleral icterus, oropharynx clear. Cardiac: RRR, no rubs, murmurs or gallops Pulm: decreased breath sounds over right lower lobe. No wheezing, No appreciated cough. Abd: soft, nontender, nondistended, BS present GU: Condom cath in place, good urine output, no gross hematuria currently Ext: warm and well perfused, no pedal edema Neuro: alert and oriented X2 (reports location [facility]), cranial nerves II-XII grossly intact   Lab Results: Basic Metabolic Panel:  Recent Labs Lab 12/13/13 1014 12/14/13 0730  NA 148* 147  K 4.1 3.5*  CL 106 107  CO2 24 31  GLUCOSE 91 67*  BUN 19 18  CREATININE 0.98 1.20  CALCIUM 9.1 8.7  MG 1.9  --    Liver Function Tests:  Recent Labs Lab 12/12/13 1546 12/13/13 1014  AST 127* 111*  ALT 87* 79*  ALKPHOS 164* 164*  BILITOT 2.1* 2.6*  PROT  8.9* 9.2*  ALBUMIN 2.6* 2.7*    Recent Labs Lab 12/12/13 1546  LIPASE 31   No results found for this basename: AMMONIA,  in the last 168 hours CBC:  Recent Labs Lab 12/12/13 1546 12/13/13 1014 12/14/13 0730  WBC 8.8 7.9 9.3  NEUTROABS 6.5  --   --   HGB 10.3* 11.5* 9.7*  HCT 34.9* 38.3* 34.0*  MCV 66.5* 66.1* 67.3*  PLT 349 335 360   Cardiac Enzymes:  Recent Labs Lab 12/12/13 1546  TROPONINI <0.30   BNP:  Recent Labs Lab 12/12/13 1546  PROBNP 3564.0*   Urine Drug Screen: Drugs of Abuse     Component Value Date/Time   LABOPIA NONE DETECTED 11/22/2013 0702   COCAINSCRNUR NONE DETECTED 11/22/2013 0702   LABBENZ NONE DETECTED 11/22/2013 0702   AMPHETMU NONE DETECTED 11/22/2013 0702   THCU NONE DETECTED 11/22/2013 0702   LABBARB NONE DETECTED 11/22/2013 0702    Alcohol Level: No results found for this basename: ETH,  in the last 168 hours Urinalysis:  Recent Labs Lab 12/13/13 0058 12/14/13 1102  COLORURINE RED* YELLOW  LABSPEC 1.026 1.008  PHURINE 5.5 6.5  GLUCOSEU NEGATIVE NEGATIVE  HGBUR LARGE* NEGATIVE  BILIRUBINUR LARGE* NEGATIVE  KETONESUR 15* NEGATIVE  PROTEINUR 100* NEGATIVE  UROBILINOGEN 2.0* 1.0  NITRITE POSITIVE* NEGATIVE  LEUKOCYTESUR MODERATE* NEGATIVE  Micro Results: Recent Results (from the past 240 hour(s))  URINE CULTURE     Status: None   Collection Time    12/13/13 12:58 AM      Result Value Ref Range Status   Specimen Description URINE, RANDOM   Final   Special Requests ADDED [id number]   Final   Culture  Setup Time     Final   Value: 12/13/2013 13:28     Performed at [place]     Final   Value: 20,OOO COLONIES/ML     Performed at [facility]   Culture     Final   Value: Multiple bacterial morphotypes present, none predominant. Suggest appropriate recollection if clinically indicated.     Performed at [facility]   Report Status 12/14/2013 FINAL   Final  CULTURE, BLOOD  (ROUTINE X 2)     Status: None   Collection Time    12/13/13 10:14 AM      Result Value Ref Range Status   Specimen Description BLOOD LEFT HAND   Final   Special Requests BOTTLES DRAWN AEROBIC ONLY 1CC   Final   Culture  Setup Time     Final   Value: 12/13/2013 18:30     Performed at [facility]   Culture     Final   Value:        BLOOD CULTURE RECEIVED NO GROWTH TO DATE CULTURE WILL BE HELD FOR 5 DAYS BEFORE ISSUING A FINAL NEGATIVE REPORT     Performed at [facility]   Report Status PENDING   Incomplete  CULTURE, BLOOD (ROUTINE X 2)     Status: None   Collection Time    12/13/13 10:22 AM      Result Value Ref Range Status   Specimen Description BLOOD RIGHT HAND   Final   Special Requests BOTTLES DRAWN AEROBIC ONLY 2CC   Final   Culture  Setup Time     Final   Value: 12/13/2013 18:30     Performed at [facility]   Culture     Final   Value:        BLOOD CULTURE RECEIVED NO GROWTH TO DATE CULTURE WILL BE HELD FOR 5 DAYS BEFORE ISSUING A FINAL NEGATIVE REPORT     Performed at [facility]   Report Status PENDING   Incomplete   Studies/Results: No results found. Medications: I have reviewed the patient's current medications. Scheduled Meds: . aspirin EC  81 mg Oral Daily  . enoxaparin (LOVENOX) injection  30 mg Subcutaneous Q24H  . feeding supplement (ENSURE COMPLETE)  237 mL Oral BID BM  . feeding supplement (ENSURE)  1 Container Oral BID PC  . furosemide  40 mg Intravenous Daily  . methadone  50 mg Oral Daily  . nystatin  5 mL Oral QID  . pantoprazole (PROTONIX) IV  40 mg Intravenous Q12H  . sodium chloride  3 mL Intravenous Q12H   Continuous Infusions: . sodium chloride 75 mL/hr at 12/14/13 0841   PRN Meds:.dextrose Assessment/Plan: CHF exacerbation, do not suspect HCAP (healthcare-associated pneumonia) - D/C Abx -Monitor Strict I/O, daily weights. (Neg 3.7 L since admission) - Slight increase in Creatinine will D/C lasix - Not  hypoxic currently, use O2 to maintain sat >92%  Bilateral Pleural Effusions, Rt >>Lt: Most likely secondary to CHF exacerbation. - Had thoracentesis (1.2L drained) at last admission (11/23/13) with Light criteria consistent with transdudate, cytology  neg for malignant cells.  - Continue diuresis, monitor output. Clinically improving  Esophageal dysmotility/ Poor po intake/ Weight loss/ Severe protein calorie malnutrition. Esophageal barium swallow on 11/23/2013 >>Marked esophageal stasis. Tertiary contractions associated with nonspecific esophageal dysmotility  Disorder.  - SLP eval>>> Aspiration risk present but recommends trial of dysphasia 2 diet - Nutrition consult>>> Severe protein calorie malnutrition >> Plan ensure complete BID and Ensure pudding. - pallative care consulted to help with goals of care (especially aspiration risk, cancer screening) >>> however patient's daughter and son seem to be understanding his limited function and do want him to potentially start a diet he would prefer despite aspiration risk.  Hypernatremia- resolved - Na 147, this AM, likely secondary to free water restriction - Given free water by 1/2NS IV fluids this AM, now tolerating diet. 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 Seen by Dr Julien Nordmann in 02/2013 and recommended repeat PET but doubted recurrence. Has a follow up appointment on 12/15/2013 to discuss CT/PET.  - Oncology consulted, Dr. Julien Nordmann will see as outpatient - His weight loss is very concerning for possible metastasis.  Hematuria - Will repeat U/A today- no RBC- hematuria likely due to traumatic cath.  Elevated liver enzymes  -Hep C Ab reactive on previous admission (no viral load)  - Hep C viral load pending (although unlikely to receive treatment would explain his LFTs and provide further prognostic information for family)  Oral Candidiasis - Nystatin   Nausea - chronic issue. Likely  related to esophageal dysmotility. Currently resolved - Lipase wnl, does have elevated liver enzymes (present on previous admission)  -Paitent has prolonged QT will avoid Zofran will consider phenergan if needed   Hx of substance abuse  - Per SNF patient has received methadone dose today, will resume methadone tomorrow.  - Patient may need clonidine for HTN with substance abuse but will try to avoid given his potential adverse reaction on last admission  Dementia  - Unclear etiology  - Patient will need continue SNF care at discharge.   Prolonged QTc  - Avoid QT prolong medications   Iron deficiency anemia  -Hgb improved since discharge.   HTN Improved with diuresis  -Continue home BP meds  - Lasix 40mg  IV daily  Hypokalemia - - KDur 83mEq - Mag wnl  Diet: Dys 2 DVT PPx: Heparin Mango  Code Status: DNR  Dispo: Disposition is deferred at this time, awaiting improvement of current medical problems.  Anticipated discharge tomorrow (SNF vs Home with home health) appreciate social and care management help.  The patient does have a current PCP (Angelica Chessman, MD) and does not need an [facility] hospital follow-up appointment after discharge.  The patient does not have transportation limitations that hinder transportation to clinic appointments.  .Services Needed at time of discharge: Y = Yes, Blank = No PT: y  OT:   RN:   Equipment: Rolling walker with 5" wheels  Other:     LOS: 2 days   Joni Reining, DO 12/14/2013, 3:33 PM

## 2013-12-14 NOTE — Evaluation (Signed)
Clinical/Bedside Swallow Evaluation Patient Details  Name: Mark [name] MRN: [id number] Date of Birth: 05-26-1951  Today's Date: 12/14/2013 Time: 0807-0903 SLP Time Calculation (min): 56 min  Past Medical History:  Past Medical History  Diagnosis Date  . Essential hypertension, benign 02/26/2013  . Liver lesion     a. Previously biopsy-proven to be a cyst (per records from a hospital in [place]).  . Hypoxia     a. Adm 02/2013, required home O2.  . Drug abuse   . Depression   . Seizure disorder     "after hip OR 02/2012 once I got home" (04/01/2013)  . GI bleed     a. 2009 at time of stroke - received 3 u blood, no source found per pt  . HTN (hypertension)   . On home oxygen therapy     2.5L "usually use it at night" (04/01/2013)  . Tuberculosis   . Positive TB test   . History of blood transfusion 2009    "don't know what it was related to" (04/01/2013)  . Stroke 2009    a. Pt reports several, last in 2009. b. Residual L sided weakness.  . Anxiety   . Adenocarcinoma, lung     a. Dx 2013, underwent left lower lobectomy under the care of Dr.Lapunzina in [place] for a stage I non-small cell lung CA.; "went to [facility] ~ 06/2012 and they couldn't find anything" (04/01/2013)  . Pneumonia   . Coronary atherosclerosis of native coronary artery    Past Surgical History:  Past Surgical History  Procedure Laterality Date  . Hip arthroplasty Left 02/2012  . Lung biopsy Left 07/15/2012    "LLL" (04/01/2013)  . 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 He was discharged to SNF. He reports that since his arrival  at the SNF he has had poor PO intake, he reports this is because the food is pureed and he doesn't like it. He reports that he has had increased generalized weakness as well as generalized pain ("all over"). He is a poor historian can has trouble giving a clear history. ADDENDUM: Spoke with SNF, patient transferred to ED at family's request due to poor PO intake. No SOB, fever, or significant productive cough noted in facility. Daughter reports that pt has had a rapid decline, requented palliative care consult. Pt seen by SLP service last admit, recommended a dys 1/thin liquid diet with esophageal strategies due to sign of regurgitation with meals.    Assessment / Plan / Recommendation Clinical Impression  Pt today demonstrates continued evidence of pharyngeal stasis including wet vocal quality and mulitiple swallows. Suspect backfow from esophagus  due to known esophageal dysmotility. Pt has reportedly had decreased PO intake due to pureed tecture which was deemed necessary last visit due to pts frequent regurgitation of PO. Today pt is observed to masticate crackers and use liquids to soften in his mouth, no gagging or vomiting. Recommend a trial of Dys 2 (ground texture) to determine if pt will tolerate solids and if new diet will increase PO intake. Aspiration risk is present with all POs due to esophageal  dysphagia. Will recommend esophageal strategies to facilitate esohpageal transit. Strongly recommend discussion of risk with pts family and consideration for POs for comfort with risk. SLP will follow for tolerance.     Aspiration Risk  Moderate    Diet Recommendation Dysphagia 2 (Fine chop);Thin liquid   Liquid Administration via: Cup Medication Administration: Crushed with puree Supervision: Full supervision/cueing for compensatory strategies Compensations: Slow rate;Small sips/bites;Multiple dry swallows after each bite/sip;Follow solids with liquid;Clear throat intermittently Postural  Changes and/or Swallow Maneuvers: Seated upright 90 degrees;Upright 30-60 min after meal    Other  Recommendations Oral Care Recommendations: Oral care BID Other Recommendations: Have oral suction available   Follow Up Recommendations  Skilled Nursing facility    Frequency and Duration min 2x/week  2 weeks   Pertinent Vitals/Pain NA    SLP Swallow Goals     Swallow Study Prior Functional Status       General HPI: 63 yo AA male male with PMH of combined heart failure (last echo 03/2013 EF 35-40%), Lung adenocarcinoma s/p left lower partial lobectomy in 2009, did not require chemo or radiotherapy, prev GI bleed, HTN, substance abuse (on methadone), he was recently admitted and treated for community acquired vs aspiration pneumonia (1/24-1/31) and found to have esophageal dysmotility. He was discharged to SNF. He reports that since his arrival at the SNF he has had poor PO intake, he reports this is because the food is pureed and he doesn't like it. He reports that he has had increased generalized weakness as well as generalized pain ("all over"). He is a poor historian can has trouble giving a clear history. ADDENDUM: Spoke with SNF, patient transferred to ED at family's request due to poor PO intake. No SOB, fever, or significant productive cough noted in facility. Daughter reports that pt has had a rapid decline, requented palliative care consult. Pt seen by SLP service last admit, recommended a dys 1/thin liquid diet with esophageal strategies due to sign of regurgitation with meals.  Type of Study: Bedside swallow evaluation Previous Swallow Assessment: BSE 11/22/12  Diet Prior to this Study: NPO Temperature Spikes Noted: No Respiratory Status: Nasal cannula History of Recent Intubation: No Behavior/Cognition: Alert;Confused Oral Cavity - Dentition: Dentures, top;Dentures, bottom Self-Feeding Abilities: Able to feed self Patient Positioning: Upright in bed Baseline Vocal Quality:  Wet Volitional Cough: Strong;Wet Volitional Swallow: Able to elicit    Oral/Motor/Sensory Function Overall Oral Motor/Sensory Function: Appears within functional limits for tasks assessed   Ice Chips     Thin Liquid Thin Liquid: Impaired Presentation: Cup;Self Fed Pharyngeal  Phase Impairments: Wet Vocal Quality;Multiple swallows;Throat Clearing - Delayed    Nectar Thick Nectar Thick Liquid: Not tested   Honey Thick Honey Thick Liquid: Not tested   Puree Puree: Not tested   Solid   GO    Solid: Impaired Presentation: Self Fed Oral Phase Impairments: Impaired anterior to posterior transit (needs liquid wash) Pharyngeal Phase Impairments: Wet Vocal Quality;Throat Clearing - Delayed      Herbie Baltimore, MA CCC-SLP [phone]  [name] 12/14/2013,9:04 AM

## 2013-12-14 NOTE — Progress Notes (Signed)
Name: Alice Vitelli MRN: [id number] DOB: 30-Dec-1950    ADMISSION DATE:  12/12/2013 CONSULTATION DATE:   12/13/13  REFERRING MD : Sandi Mealy PRIMARY SERVICE: same   CHIEF COMPLAINT:  Poor intake   BRIEF PATIENT DESCRIPTION:  27 yobm quit smoking 2012 with dx of transudative R Pleural effusion made on 1/26 readmitted with poor po intake 2/14 and noted to have R > L recurrent effusions and PCCM asked to eval am 2/15  SIGNIFICANT EVENTS / STUDIES:  Echo 04/01/13 - Left ventricle: The cavity size was normal. Wall thickness was normal. Systolic function was moderately reduced. The estimated ejection fraction was in the range of 35% to 40%. Diffuse hypokinesis. There is moderate hypokinesis of the basalinferolateral myocardium. Features are consistent with a pseudonormal left ventricular filling pattern, with concomitant abnormal relaxation and increased filling pressure (grade 2 diastolic dysfunction). - Mitral valve: Mild regurgitation. - Left atrium: The atrium was mildly dilated. - Right atrium: The atrium was mildly dilated. - Pulmonary arteries: PA peak pressure: 35mm Hg (      CULTURES:  2/15 bc>> 2/15 uc>>  ANTIBIOTICS: Zosyn 2/14 >>off Vanc 2/15 >>off   HISTORY OF PRESENT ILLNESS:   63 yo AA male with PMH of combined heart failure (last echo 03/2013 EF 35-40%), Lung adenocarcinoma s/p left lower partial lobectomy in 2009, did not require chemo or radiotherapy, prev GI bleed, HTN, substance abuse (on methadone), he was recently admitted and treated for community acquired vs aspiration pneumonia (1/24-1/31) and found to have esophageal dysmotility. He was discharged to SNF. He reports that since his arrival at the SNF he has had poor PO intake, he reports this is because the food is pureed and he doesn't like it. He reports that he has had increased generalized weakness as well as generalized pain ("all over"). He is a poor historian can has trouble giving a clear history.   ADDENDUM: Spoke with SNF, patient transferred to ED at family's request due to poor PO intake. No SOB, fever, or significant productive cough noted in facility   Has mild doe and No obvious day to day or daytime variabilty or assoc chronic cough or cp or chest tightness, subjective wheeze overt sinus or hb symptoms. No unusual exp hx or h/o childhood pna/ asthma or knowledge of premature birth.  Sleeping ok without nocturnal  or early am exacerbation  of respiratory  c/o's or need for noct saba. Also denies any obvious fluctuation of symptoms with weather or environmental changes or other aggravating or alleviating factors except as outlined above   SUBJECTIVE:  Continues to eat despite known history of aspiration  VITAL SIGNS: Temp:  [97.6 F (36.4 C)-97.8 F (36.6 C)] 97.8 F (36.6 C) (02/16 0610) Pulse Rate:  [89-95] 95 (02/16 0610) Resp:  [18] 18 (02/16 0610) BP: (142-159)/(91-108) 142/91 mmHg (02/16 0610) SpO2:  [95 %-100 %] 100 % (02/16 0610) Weight:  [58.6 kg (129 lb 3 oz)] 58.6 kg (129 lb 3 oz) (02/16 0610) HEMODYNAMICS:   VENTILATOR SETTINGS:   INTAKE / OUTPUT:  Intake/Output Summary (Last 24 hours) at 12/14/13 1134 Last data filed at 12/14/13 0905  Gross per 24 hour  Intake      0 ml  Output   3425 ml  Net  -3425 ml    PHYSICAL EXAMINATION: General: chronically ill edlerly bm nad at 30 degrees HEENT no jvd  No JVD or cervical adenopathy. Mild accessory muscle hypertrophy. Trachea midline, nl thryroid. Chest was hyperinflated by percussion with  dullness in both bases and  diminished breath sounds and moderate increased exp time without wheeze. Regular rate and rhythm without murmur gallop or rub or increase P2 or edema.   Abd: no hsm, nl excursion. Ext warm without cyanosis or clubbing.    LABS:  CBC  Recent Labs Lab 12/12/13 1546 12/13/13 1014 12/14/13 0730  WBC 8.8 7.9 9.3  HGB 10.3* 11.5* 9.7*  HCT 34.9* 38.3* 34.0*  PLT 349 335 360   Coag's No  results found for this basename: APTT, INR,  in the last 168 hours BMET  Recent Labs Lab 12/12/13 1546 12/13/13 1014 12/14/13 0730  NA 141 148* 147  K 3.4* 4.1 3.5*  CL 101 106 107  CO2 27 24 31   BUN 19 19 18   CREATININE 0.99 0.98 1.20  GLUCOSE 101* 91 67*   Electrolytes  Recent Labs Lab 12/12/13 1546 12/13/13 1014 12/14/13 0730  CALCIUM 8.7 9.1 8.7  MG  --  1.9  --    Sepsis Markers No results found for this basename: LATICACIDVEN, PROCALCITON, O2SATVEN,  in the last 168 hours ABG No results found for this basename: PHART, PCO2ART, PO2ART,  in the last 168 hours Liver Enzymes  Recent Labs Lab 12/12/13 1546 12/13/13 1014  AST 127* 111*  ALT 87* 79*  ALKPHOS 164* 164*  BILITOT 2.1* 2.6*  ALBUMIN 2.6* 2.7*   Cardiac Enzymes  Recent Labs Lab 12/12/13 1546  TROPONINI <0.30  PROBNP 3564.0*   Glucose  Recent Labs Lab 12/13/13 2056 12/13/13 2126 12/13/13 2331 12/14/13 0305 12/14/13 0604 12/14/13 0738  GLUCAP 40* 191* 110* 68* 96 69*    Imaging Dg Chest 2 View  12/12/2013   CLINICAL DATA:  Fatigue.  EXAM: CHEST  2 VIEW  COMPARISON:  November 23, 2013.  FINDINGS: Mild cardiomegaly is noted. Left pleural effusion is noted which is improved compared to prior exam. However, mild right pleural effusion is noted which is significantly increased compared to prior exam. Left perihilar and right infrahilar opacities are noted concerning for edema or possibly pneumonia. No pneumothorax is noted. Bony thorax is intact.  IMPRESSION: Left perihilar and right infrahilar opacities are noted concerning for edema or possibly pneumonia. Increased right pleural effusion is noted, with improved left pleural effusion.   Electronically Signed   By: Sabino Dick M.D.   On: 12/12/2013 15:14         ASSESSMENT / PLAN:  1) R > L effusion in pt with documented chf and low albumin complicated by hypoxemic /? 02 dep resp failure - sp R Tcentesis 11/23/13 with transudative  features.  No indication for repeat thoracentesis at this point - note pt is hypertensive adding to likelihood of further elevating the hydrostatic forces in setting on low serum oncotic pressure (a  Perfect storm).  rec consider adding aldactone if bun/creat/K will allow    2) HBP in pt on methadone rec consider clonidine an excellent choice for hbp in this setting. Given his tendency to dysphagia I would avoid acei here and rx for GERD aggressively (reflux aggravates acei effects on the upper airway)   Global: Better with diuresis, chronic aspiration but eating. PCCM will sign off. Please call if we can be of further assistance  Merton Border, MD ; [facility] [phone].  After 5:30 PM or weekends, call [phone]   Seen with: Richardson Landry Minor ACNP Maryanna Shape PCCM Pager [phone] till 3 pm If no answer page [phone] 12/14/2013, 11:37 AM

## 2013-12-15 ENCOUNTER — Ambulatory Visit: Payer: Medicaid Other | Admitting: Physician Assistant

## 2013-12-15 ENCOUNTER — Other Ambulatory Visit: Payer: Medicaid Other

## 2013-12-15 DIAGNOSIS — B370 Candidal stomatitis: Secondary | ICD-10-CM

## 2013-12-15 LAB — MRSA PCR SCREENING: MRSA by PCR: NEGATIVE

## 2013-12-15 MED ORDER — ENSURE COMPLETE PO LIQD: 237.0000 mL | Freq: Two times a day (BID) | ORAL | Status: AC

## 2013-12-15 MED ORDER — POTASSIUM CHLORIDE CRYS ER 20 MEQ PO TBCR: 20.0000 meq | EXTENDED_RELEASE_TABLET | Freq: Every day | ORAL | Status: AC

## 2013-12-15 MED ORDER — CARVEDILOL 3.125 MG PO TABS: 3.1250 mg | ORAL_TABLET | Freq: Two times a day (BID) | ORAL | Status: AC

## 2013-12-15 MED ORDER — ENSURE PUDDING PO PUDG: 1.0000 | Freq: Two times a day (BID) | ORAL | Status: AC

## 2013-12-15 MED ORDER — FUROSEMIDE 20 MG PO TABS: 20.0000 mg | ORAL_TABLET | Freq: Every day | ORAL | Status: AC

## 2013-12-15 MED ORDER — NYSTATIN 100000 UNIT/ML MT SUSP: 5.0000 mL | Freq: Four times a day (QID) | OROMUCOSAL | Status: AC

## 2013-12-15 MED ORDER — DIPHENHYDRAMINE HCL 25 MG PO CAPS
50.0000 mg | ORAL_CAPSULE | Freq: Once | ORAL | Status: DC
  Filled 2013-12-15: qty 2

## 2013-12-15 NOTE — Progress Notes (Signed)
INTERNAL MEDICINE TEACHING SERVICE DAILY PROGRESS NOTE  Subjective: Patient dressed sitting in chair eating ensure pudding.  Reports he would like to go home. He does seem slightly confused but is oriented and seems to understand his medical problems, medications, and plans for follow up. Objective: Vital signs in last 24 hours: Filed Vitals:   12/14/13 2322 12/15/13 0222 12/15/13 0650 12/15/13 1009  BP:  143/81 150/98 139/87  Pulse: 89 90 91 90  Temp:  97.7 F (36.5 C) 97.9 F (36.6 C) 97.9 F (36.6 C)  TempSrc:  Oral Oral Oral  Resp: 19 20 20 19   Weight:   126 lb 1.7 oz (57.2 kg)   SpO2:  98% 98% 100%   Weight change: -3 lb 1.4 oz (-1.4 kg)  Intake/Output Summary (Last 24 hours) at 12/15/13 1331 Last data filed at 12/15/13 0853  Gross per 24 hour  Intake    840 ml  Output   1700 ml  Net   -860 ml   General: resting in bed HEENT: EOMI, no scleral icterus, oropharynx clear. Cardiac: RRR, no rubs, murmurs or gallops Pulm: improved breath sounds bilaterally. No wheezing, No appreciated cough. Abd: soft, nontender, nondistended, BS present Ext: warm and well perfused, no pedal edema Neuro: alert and oriented X3, still seems to have some confusion in his speech.   Lab Results: Basic Metabolic Panel:  Recent Labs Lab 12/13/13 1014 12/14/13 0730  NA 148* 147  K 4.1 3.5*  CL 106 107  CO2 24 31  GLUCOSE 91 67*  BUN 19 18  CREATININE 0.98 1.20  CALCIUM 9.1 8.7  MG 1.9  --    Liver Function Tests:  Recent Labs Lab 12/12/13 1546 12/13/13 1014  AST 127* 111*  ALT 87* 79*  ALKPHOS 164* 164*  BILITOT 2.1* 2.6*  PROT 8.9* 9.2*  ALBUMIN 2.6* 2.7*    Recent Labs Lab 12/12/13 1546  LIPASE 31   No results found for this basename: AMMONIA,  in the last 168 hours CBC:  Recent Labs Lab 12/12/13 1546 12/13/13 1014 12/14/13 0730  WBC 8.8 7.9 9.3  NEUTROABS 6.5  --   --   HGB 10.3* 11.5* 9.7*  HCT 34.9* 38.3* 34.0*  MCV 66.5* 66.1* 67.3*  PLT 349 335 360    Cardiac Enzymes:  Recent Labs Lab 12/12/13 1546  TROPONINI <0.30   BNP:  Recent Labs Lab 12/12/13 1546  PROBNP 3564.0*   Urine Drug Screen: Drugs of Abuse     Component Value Date/Time   LABOPIA NONE DETECTED 11/22/2013 0702   COCAINSCRNUR NONE DETECTED 11/22/2013 0702   LABBENZ NONE DETECTED 11/22/2013 0702   AMPHETMU NONE DETECTED 11/22/2013 0702   THCU NONE DETECTED 11/22/2013 0702   LABBARB NONE DETECTED 11/22/2013 6761    Alcohol Level: No results found for this basename: ETH,  in the last 168 hours Urinalysis:  Recent Labs Lab 12/13/13 0058 12/14/13 1102  COLORURINE RED* YELLOW  LABSPEC 1.026 1.008  PHURINE 5.5 6.5  GLUCOSEU NEGATIVE NEGATIVE  HGBUR LARGE* NEGATIVE  BILIRUBINUR LARGE* NEGATIVE  KETONESUR 15* NEGATIVE  PROTEINUR 100* NEGATIVE  UROBILINOGEN 2.0* 1.0  NITRITE POSITIVE* NEGATIVE  LEUKOCYTESUR MODERATE* NEGATIVE    Micro Results: Recent Results (from the past 240 hour(s))  URINE CULTURE     Status: None   Collection Time    12/13/13 12:58 AM      Result Value Ref Range Status   Specimen Description URINE, RANDOM   Final   Special Requests ADDED [id number] [phone]  Final   Culture  Setup Time     Final   Value: 12/13/2013 13:28     Performed at [facility] Count     Final   Value: 20,OOO COLONIES/ML     Performed at [facility]   Culture     Final   Value: Multiple bacterial morphotypes present, none predominant. Suggest appropriate recollection if clinically indicated.     Performed at [facility]   Report Status 12/14/2013 FINAL   Final  CULTURE, BLOOD (ROUTINE X 2)     Status: None   Collection Time    12/13/13 10:14 AM      Result Value Ref Range Status   Specimen Description BLOOD LEFT HAND   Final   Special Requests BOTTLES DRAWN AEROBIC ONLY 1CC   Final   Culture  Setup Time     Final   Value: 12/13/2013 18:30     Performed at [facility]   Culture     Final   Value:        BLOOD  CULTURE RECEIVED NO GROWTH TO DATE CULTURE WILL BE HELD FOR 5 DAYS BEFORE ISSUING A FINAL NEGATIVE REPORT     Performed at [facility]   Report Status PENDING   Incomplete  CULTURE, BLOOD (ROUTINE X 2)     Status: None   Collection Time    12/13/13 10:22 AM      Result Value Ref Range Status   Specimen Description BLOOD RIGHT HAND   Final   Special Requests BOTTLES DRAWN AEROBIC ONLY 2CC   Final   Culture  Setup Time     Final   Value: 12/13/2013 18:30     Performed at [facility]   Culture     Final   Value:        BLOOD CULTURE RECEIVED NO GROWTH TO DATE CULTURE WILL BE HELD FOR 5 DAYS BEFORE ISSUING A FINAL NEGATIVE REPORT     Performed at [facility]   Report Status PENDING   Incomplete  MRSA PCR SCREENING     Status: None   Collection Time    12/15/13 12:46 AM      Result Value Ref Range Status   MRSA by PCR NEGATIVE  NEGATIVE Final   Comment:            The GeneXpert MRSA Assay (FDA     approved for NASAL specimens     only), is one component of a     comprehensive MRSA colonization     surveillance program. It is not     intended to diagnose MRSA     infection nor to guide or     monitor treatment for     MRSA infections.   Studies/Results: No results found. Medications: I have reviewed the patient's current medications. Scheduled Meds: . aspirin EC  81 mg Oral Daily  . diphenhydrAMINE  50 mg Oral Once  . enoxaparin (LOVENOX) injection  30 mg Subcutaneous Q24H  . feeding supplement (ENSURE COMPLETE)  237 mL Oral BID BM  . feeding supplement (ENSURE)  1 Container Oral BID PC  . methadone  50 mg Oral Daily  . nystatin  5 mL Oral QID  . pantoprazole (PROTONIX) IV  40 mg Intravenous Q12H  . sodium chloride  3 mL Intravenous Q12H   Continuous Infusions:   PRN Meds:.dextrose Assessment/Plan: CHF exacerbation, do not suspect HCAP  -Monitor Strict I/O,  daily weights. (Neg 4 L since admission) - Not hypoxic currently - Will discharge on  low dose Coreg, Lasix 20mg  daily  Bilateral Pleural Effusions, Rt >>Lt: Most likely secondary to CHF exacerbation. - Had thoracentesis (1.2L drained) at last admission (11/23/13) with Light criteria consistent with transdudate, cytology neg for malignant cells.  - Improved breath sounds after dieurisis  Esophageal dysmotility/ Poor po intake/ Weight loss/ Severe protein calorie malnutrition. Esophageal barium swallow on 11/23/2013 >>Marked esophageal stasis. Tertiary contractions associated with nonspecific esophageal dysmotility  Disorder.  - SLP eval>>> Aspiration risk present but recommends dysphasia 2 diet - Nutrition consult>>> Severe protein calorie malnutrition >> Plan ensure complete BID and Ensure pudding. - Pallative care consult appreciated. Patient does want continued cancer screening. 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 Seen by Dr Julien Nordmann in 02/2013 and recommended repeat PET but doubted recurrence. Has a follow up appointment on 12/15/2013 to discuss CT/PET.  - Oncology consulted, Dr. Julien Nordmann will see as outpatient - His weight loss is very concerning for possible metastasis. - Patient will follow up with Dr. Julien Nordmann as outpatient  Hematuria resolved likely secondary to traumatic cath  Elevated liver enzymes  -Hep C Ab reactive on previous admission (no viral load)  - Hep C viral load pending >>> apparently canceled by lab- quantity not sufficient.  Can follow this up as outpatient.  Oral Candidiasis - Nystatin   Nausea - chronic issue. Likely related to esophageal dysmotility. Currently resolved  Hx of substance abuse  - Continue Methadone - He will continue Tx at [facility]  Dementia  - Unclear etiology  - Patient will need continue SNF care at discharge.   Prolonged QTc  -  Avoid QT prolong medications   Iron deficiency anemia  -Hgb improved since discharge.   HTN Improved with diuresis  -Continue home BP meds  - Add Coreg on discharge  Hypokalemia - - Imoproved, will discharge with 79mEq daily. - Mag wnl  Diet: Dys 2 DVT PPx: Heparin [place]  Code Status: DNR  Dispo: Discharge home (with daughter) with home health  The patient does have a current PCP (Angelica Chessman, MD) and does not need an [facility] hospital follow-up appointment after discharge.  The patient does not have transportation limitations that hinder transportation to clinic appointments.  .Services Needed at time of discharge: Y = Yes, Blank = No PT: y  OT:   RN:   Equipment: Rolling walker with 5" wheels  Other:     LOS: 3 days   Joni Reining, DO 12/15/2013, 1:31 PM

## 2013-12-15 NOTE — Progress Notes (Signed)
Patient refusing telemetry.  CCMD notified. MD on call notified. RN will continue to monitor. Shellee Milo, RN

## 2013-12-15 NOTE — Progress Notes (Signed)
Patient very agitated.  MD on call notified, awaiting new orders. Bed alarm on. RN will continue to monitor. Shellee Milo, RN

## 2013-12-15 NOTE — Progress Notes (Signed)
Patient's IV and telemetry removed, patient's discharged instructions reviewed with patient's daughter and the patient will be discharged home with daughter.  Ruben Im RN

## 2013-12-15 NOTE — Discharge Summary (Signed)
Name: Mark [name] MRN: [id number] DOB: 1951/05/02 63 y.o. PCP: Mark Chessman, MD  Date of Admission: 12/12/2013  1:44 PM Date of Discharge: 12/15/2013 Attending Physician: Mark Headings, MD  Discharge Diagnosis:  Acute Exacerbation of combined CHF   Essential hypertension, benign   ETOH abuse   Coronary atherosclerosis of native coronary artery   Anemia   Adenocarcinoma, lung s/p partial lobectomy   Dysphagia, unspecified(787.20)   Substance abuse   Unspecified constipation   Pleural effusion   Lymphadenopathy   Esophageal dysmotility   Prolonged Q-T interval on ECG   Urinary tract infection   Protein-calorie malnutrition, severe  Discharge Medications:   Medication List         aspirin 81 MG EC tablet  Take 1 tablet (81 mg total) by mouth daily.     carvedilol 3.125 MG tablet  Commonly known as:  COREG  Take 1 tablet (3.125 mg total) by mouth 2 (two) times daily with a meal.     cholecalciferol 1000 UNITS tablet  Commonly known as:  VITAMIN D  Take 1,000 Units by mouth daily.     docusate 50 MG/5ML liquid  Commonly known as:  COLACE  Take 5 mLs (50 mg total) by mouth daily.     feeding supplement (ENSURE COMPLETE) Liqd  Take 237 mLs by mouth 2 (two) times daily between meals.     feeding supplement (ENSURE) Pudg  Take 1 Container by mouth 2 (two) times daily after a meal.     folic acid 676 MCG tablet  Commonly known as:  FOLVITE  Take 400 mcg by mouth daily.     furosemide 20 MG tablet  Commonly known as:  LASIX  Take 1 tablet (20 mg total) by mouth daily.     hydrALAZINE 10 MG tablet  Commonly known as:  APRESOLINE  Take 1 tablet (10 mg total) by mouth 3 (three) times daily.     lisinopril 30 MG tablet  Commonly known as:  PRINIVIL,ZESTRIL  Take 30 mg by mouth daily.     methadone 10 MG tablet  Commonly known as:  DOLOPHINE  Take 50 mg by mouth daily. Take five tablets (50 mg) by mouth daily     multivitamin with minerals Tabs tablet    Take 1 tablet by mouth daily.     nystatin 100000 UNIT/ML suspension  Commonly known as:  MYCOSTATIN  Take 5 mLs (500,000 Units total) by mouth 4 (four) times daily.     pantoprazole 20 MG tablet  Commonly known as:  PROTONIX  Take 1 tablet (20 mg total) by mouth daily.     polyethylene glycol powder powder  Commonly known as:  GLYCOLAX/MIRALAX  - Take 17 g by mouth 2 (two) times daily. Until daily soft stools  -   - OTC     potassium chloride SA 20 MEQ tablet  Commonly known as:  K-DUR,KLOR-CON  Take 1 tablet (20 mEq total) by mouth daily.        Disposition and follow-up:   Mr.Mark [name] was discharged from [facility] in Stable condition.  At the hospital follow up visit please address:  1.  Volume status, Weight, PO intake, Heart Rate, Patient started on low dose coreg, lasix, potassium. Patient needs to follow up with Oncology.  2.  Labs / imaging needed at time of follow-up: BMP (Check K+)  3.  Pending labs/ test needing follow-up: None  Follow-up Appointments:   Discharge Instructions:  Future Appointments Provider Department Dept Phone   12/24/2013 1:30 PM Mark Males, MD [facility] [phone]   03/09/2014 12:00 PM Mark Marek, MD [place] [phone]      Consultations: Treatment Team:  Palliative [facility]  Procedures Performed:  Dg Chest 1 View  11/23/2013   CLINICAL DATA:  Status post right thoracentesis  EXAM: CHEST - 1 VIEW  COMPARISON:  CT chest dated 11/21/2013  FINDINGS: No definite pneumothorax is seen status post right thoracentesis.  Mild patchy right lower lobe opacity, suspicious for pneumonia.  Small loculated/layering left pleural effusion.  Cardiomegaly.  Residual contrast in the mid esophagus from recent fluoroscopic study, reflecting esophageal dysmotility and/or reflux.  IMPRESSION: No definite pneumothorax is seen status post right thoracentesis.  Small  loculated/layering left pleural effusion.  Mild patchy right lower lobe opacity, suspicious for pneumonia.   Electronically Signed   By: Julian Hy M.D.   On: 11/23/2013 10:46   Dg Chest 2 View  12/12/2013   CLINICAL DATA:  Fatigue.  EXAM: CHEST  2 VIEW  COMPARISON:  November 23, 2013.  FINDINGS: Mild cardiomegaly is noted. Left pleural effusion is noted which is improved compared to prior exam. However, mild right pleural effusion is noted which is significantly increased compared to prior exam. Left perihilar and right infrahilar opacities are noted concerning for edema or possibly pneumonia. No pneumothorax is noted. Bony thorax is intact.  IMPRESSION: Left perihilar and right infrahilar opacities are noted concerning for edema or possibly pneumonia. Increased right pleural effusion is noted, with improved left pleural effusion.   Electronically Signed   By: Sabino Dick M.D.   On: 12/12/2013 15:14   Dg Chest 2 View  11/21/2013   CLINICAL DATA:  One week history of shortness of breath  EXAM: CHEST  2 VIEW  COMPARISON:  Prior chest x-ray 11/18/2013 ; prior CT chest/abdomen/ pelvis 02/26/2013  FINDINGS: Slightly increased ill-defined patchy airspace opacities predominantly in the right lung base but also in the bilateral mid lungs. A persistent moderate right layering pleural effusion. Also likely a small left pleural effusion versus pleural thickening. Unchanged cardiac and mediastinal contours. Atherosclerotic calcification noted in the transverse aorta. No acute osseous abnormality.  IMPRESSION: 1. Similar to slight interval progression of bilateral patchy airspace disease worst in the right lower lobe. Findings remain concerning for a multifocal infectious/inflammatory process. 2. Small bilateral pleural effusions.   Electronically Signed   By: Jacqulynn Cadet M.D.   On: 11/21/2013 13:07   Dg Chest 2 View  11/18/2013   CLINICAL DATA:  Shortness of breath. Congestion for 2 days. Ex-smoker.  Hypertension and diabetes.  EXAM: CHEST  2 VIEW  COMPARISON:  DG CHEST 2 VIEW dated 04/01/2013; CT CHEST W/CM dated 02/26/2013; NM PET IMAGE INITIAL (PI) SKULL BASE TO THIGH dated 03/17/2013  FINDINGS: Hyperinflation. Midline trachea. Mild cardiomegaly. Can't exclude right hilar soft tissue fullness. Improved left-sided aeration. Persistent interstitial and airspace opacities on the right. Most confluent in the right lower lobe. Small bilateral pleural effusions. No pneumothorax.  IMPRESSION: Patchy right-sided interstitial and airspace disease, suspicious for infection. Recommend radiographic follow-up until clearing.  Cardiomegaly with small bilateral pleural effusions.  Cannot exclude right hilar soft tissue fullness. Recommend attention on follow-up.   Electronically Signed   By: Abigail Miyamoto M.D.   On: 11/18/2013 22:25   Ct Head Wo Contrast  11/19/2013   CLINICAL DATA:  Shortness of breath.  EXAM: CT HEAD WITHOUT CONTRAST  TECHNIQUE: Contiguous  axial images were obtained from the base of the skull through the vertex without intravenous contrast.  COMPARISON:  Images from the PET/CT performed 03/17/2013, and MRI of the brain performed 03/02/2013  FINDINGS: There is no evidence of acute infarction, mass lesion, or intra- or extra-axial hemorrhage on CT.  Cerebellar atrophy is noted. Scattered periventricular and subcortical white matter change likely reflects small vessel ischemic microangiopathy. Scattered chronic lacunar infarcts are seen within the basal ganglia.  The brainstem and fourth ventricle are within normal limits. The third and lateral ventricles are unremarkable in appearance. The cerebral hemispheres are symmetric in appearance, with normal gray-white differentiation. No mass effect or midline shift is seen.  There is no evidence of fracture; visualized osseous structures are unremarkable in appearance. The visualized portions of the orbits are within normal limits. The paranasal sinuses and mastoid  air cells are well-aerated. No significant soft tissue abnormalities are seen.  IMPRESSION: 1. No acute intracranial pathology seen on CT. 2. Cerebellar atrophy noted. 3. Scattered small vessel ischemic microangiopathy; scattered chronic lacunar infarcts within the basal ganglia.   Electronically Signed   By: Garald Balding M.D.   On: 11/19/2013 03:43   Ct Chest W Contrast  11/21/2013   CLINICAL DATA:  Known pneumonia, with productive cough and generalized weakness.  EXAM: CT CHEST WITH CONTRAST  TECHNIQUE: Multidetector CT imaging of the chest was performed during intravenous contrast administration.  CONTRAST:  55mL OMNIPAQUE IOHEXOL 300 MG/ML  SOLN  COMPARISON:  Chest radiograph performed earlier today at 12:07 p.m., and CT of the chest performed 02/26/2013  FINDINGS: Hazy peribronchial opacities are seen bilaterally, more prominent at the right lung base, with associated bronchiectasis. More peripheral opacities are seen within the left lung. Small to moderate right and small left pleural effusions are seen, mildly loculated on the left. There is associated scarring at the lung bases, with scattered blebs seen at the lung apices. Some of the opacities are relatively nodular in appearance. A 4 mm nodule is seen in the left lower lobe (image 46 of 69). There is no evidence of pneumothorax.  Scattered coronary artery calcifications are seen. The esophagus is partially filled with fluid, raising concern for esophageal dysmotility. Mildly enlarged right paratracheal, subcarinal and azygoesophageal recess nodes are seen, measuring up to 1.3 cm in short axis. Trace pericardial fluid remains within normal limits. The great vessels are grossly unremarkable in appearance. The thyroid gland is unremarkable in appearance. No axillary lymphadenopathy is seen.  A 2.9 x 1.2 cm collection of fluid attenuation along the anterior aspect of the liver is only minimally changed from prior study, and is likely benign. Trace fluid  is seen tracking about the liver. The visualized portions of the liver and spleen are otherwise unremarkable.  IMPRESSION: 1. Hazy bilateral peribronchial opacities, more prominent at the right lung base, with associated bronchiectasis. More peripheral opacities seen within the left lung. Small to moderate right and small left pleural effusions seen, mildly loculated on left. This raises concern for multifocal pneumonia, possibly atypical in nature. 2. Scarring at the lung bases, with scattered blebs at the lung apices. 4 mm nodule at the left lower lung lobe; this region was not well assessed on prior studies due to airspace opacification. If the patient is at high risk for bronchogenic carcinoma, follow-up chest CT at 1 year is recommended. If the patient is at low risk, no follow-up is needed. This recommendation follows the consensus statement: Guidelines for Management of Small Pulmonary Nodules Detected on  CT Scans: A Statement from the [place] as published in Radiology 2005; 237:395-400. 3. Scattered coronary artery calcifications seen. 4. Esophagus partially filled with fluid, raising concern for esophageal dysmotility. 5. Mildly enlarged mediastinal nodes seen, measuring up to 1.3 cm in short axis. Several of these are similar in size on the prior study, and are nonspecific in appearance. 6. 2.9 x 1.2 cm collection of fluid attenuation along the anterior aspect of the liver is only minimally changed from the prior study, and likely reflects a cyst. 7. Trace ascites noted about the liver.   Electronically Signed   By: Garald Balding M.D.   On: 11/21/2013 21:43   Dg Esophagus  11/23/2013   CLINICAL DATA:  Difficulty swallowing. Personal history of lung cancer. Generalized weakness. Dysphagia.  EXAM: ESOPHOGRAM/BARIUM SWALLOW  TECHNIQUE: Single contrast examination was performed using  thin barium.  COMPARISON:  Chest CT 11/21/2013.  FLUOROSCOPY TIME:  3 min 15 seconds  FINDINGS: Study is  technically suboptimal. The patient was unable to cooperate with swallowing to adequately distend the esophagus. Only about 4 oz of contrast was ingested throughout the study despite instructions. There was profound esophageal stasis. No gross fixed strictures were identified. Tertiary contractions were observed. Esophageal diverticulum was present in the distal esophagus, just proximal to the gastroesophageal junction. 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 Because of patient debilitation, we could not repositioned patient to optimally visualize this distal esophageal diverticulum. No hiatal hernia was identified. There was not adequate distension to assess for reflux. The epiphrenic diverticulum is best visualized on series 20.  IMPRESSION: Technically suboptimal study due to patient debilitation, inability to cooperate with swallowing and small amount of ingested contrast. No focal strictures or esophageal mass lesions identified. Small epiphrenic diverticulum. Marked esophageal stasis. Tertiary contractions associated with nonspecific esophageal dysmotility disorder.   Electronically Signed   By: Dereck Ligas M.D.   On: 11/23/2013 09:43   US Thoracentesis Asp Pleural Space W/img Guide  11/23/2013   CLINICAL DATA:  Pleural effusion, shortness of breath, request for diagnostic and therapeutic thoracentesis  EXAM: ULTRASOUND GUIDED Right THORACENTESIS  COMPARISON:  None.  FINDINGS: A total of approximately 1.2 liters of serous fluid was removed. A fluid sample was sent for laboratory analysis.  IMPRESSION: Successful ultrasound guided right thoracentesis yielding 1.2 liters of pleural fluid.  Read By:  Tsosie Billing PA-C  PROCEDURE: An ultrasound guided thoracentesis was thoroughly discussed with the patient and questions answered. The benefits, risks, alternatives and complications were also discussed. The patient understands and wishes to proceed with the procedure. Written consent  was obtained.  Ultrasound was performed to localize and mark an adequate pocket of fluid in the right chest. The area was then prepped and draped in the normal sterile fashion. 1% Lidocaine was used for local anesthesia. Under ultrasound guidance a 19 gauge Yueh catheter was introduced. Thoracentesis was performed. The catheter was removed and a dressing applied.  Complications:  none.   Electronically Signed   By: Arne Cleveland M.D.   On: 11/23/2013 10:05   Admission HPI: Trask Vosler is a 63 yo AA male with PMH of combined heart failure (last echo 03/2013 EF 35-40%), Lung adenocarcinoma s/p left lower partial lobectomy in 2009, did not require chemo or radiotherapy, prev GI bleed, HTN, substance abuse (on methadone), he was recently admitted and treated for community acquired vs aspiration pneumonia (1/24-1/31) and found to have esophageal dysmotility. He was discharged to SNF. He reports that since his arrival  at the SNF he has had poor PO intake, he reports this is because the food is pureed and he doesn't like it. He reports that he has had increased generalized weakness as well as generalized pain ("all over"). He is a poor historian can has trouble giving a clear history.  ADDENDUM: Spoke with SNF, patient transferred to ED at family's request due to poor PO intake. No SOB, fever, or significant productive cough noted in facility.   Hospital Course by problem list: Acute exacerbation of combined CHF Initially patient was admitted with complaints of cough after recent discharge for aspiration pneumonia.  His CXR showed as possible pneumonia vs CHF exacerbation. There was a questionable history of fevers in the nursing home and he was started on empiric antibiotics to cover HCAP.  Patient remained afebrile throughout his stay and on hospital day 2 his antibiotics were discontinued.  His elevated proBNP and increase right pleural effusion suggested likey CHF exacerbation as the cause of his chest xray  findings.  He was diuresed 4L with IV lasix on this admission and was discharged on Lasix 20mg  daily and low dose coreg (as he had not been started on a beta blocker).  Esophageal dysmotility/ Poor po intake/ Weight loss/ Severe protein calorie malnutrition.  On patient's previous hospitalization he was noted to have marked esophageal stasis by MBS. Initially given concern for aspiration pneumonia he was kept NPO until SLP evaluation.  After discussions with patient as well as daughter and son it was discussed that he will remain as aspiration risk with any PO intake but this risk was attempted to be mitigated by a dysphasia diet.  In addition given patient's large amount of weight loss while on a dysphasia diet nutrition was consulted who recommended Ensure supplements.  Palliative care was also consulted given concern for patient's continued physical decline.  Patient did want to continue cancer screening and wish to be discharged home with home health to live with family.  This wish was respected and he was discharged home to live with daughter.   Hx Lung stage I Adenocarcinoma s/p left partial lobectomy in may 2013. Seen by Dr Julien Nordmann in 02/2013 and recommended repeat PET but doubted recurrence. Has a follow up appointment on 12/15/2013 to discuss CT/PET. Oncology was consulted, however it was felt that he could follow up in outpatient setting.  Hematuria  Present on admission resolved next AM, felt to be likely secondary to traumatic cath.   Elevated liver enzymes  Hep C Ab reactive on previous admission (no viral load). A Hep C viral load was ordered but apparently canceled by lab- quantity not sufficient. Recommend this be followed up as outpatient.   Oral Candidiasis  Treat with Nystatin   Hx of substance abuse  - Continue home dose of Methadone. He will continue Tx at [facility]    Prolonged QTc  Avoided QT prolonging medications  Iron deficiency anemia  -Hgb improved  since previous discharge.   HTN  Improved with diuresis    Hypokalemia -  - Repleated, discharged with 67mEq daily.     Discharge Vitals:   BP 139/87  Pulse 90  Temp(Src) 97.9 F (36.6 C) (Oral)  Resp 19  Wt 126 lb 1.7 oz (57.2 kg)  SpO2 100%  Discharge Labs:  Results for orders placed during the hospital encounter of 12/12/13 (from the past 24 hour(s))  GLUCOSE, CAPILLARY     Status: Abnormal   Collection Time    12/14/13  4:10  PM      Result Value Ref Range   Glucose-Capillary 105 (*) 70 - 99 mg/dL   Comment 1 Notify RN    MRSA PCR SCREENING     Status: None   Collection Time    12/15/13 12:46 AM      Result Value Ref Range   MRSA by PCR NEGATIVE  NEGATIVE    Signed: Joni Reining, DO 12/15/2013, 12:34 PM   Time Spent on Discharge: 35 minutes Services Ordered on Discharge: PT, RN Equipment Ordered on Discharge: Conservation officer, nature

## 2013-12-15 NOTE — Evaluation (Signed)
Occupational Therapy Evaluation Patient Details Name: Mark [name] MRN: [id number] DOB: 03/31/1951 Today's Date: 12/15/2013 Time: 0488-8916 OT Time Calculation (min): 14 min  OT Assessment / Plan / Recommendation History of present illness 63 y.o.male with past history of HTN, CHF (03/2013 EF 94-50% with diastolic dysfunction), lung ca s/p LL partial lobectomy in 2009, and esophageal dysmotility, adn dementia, recently admitted for pneumonia. discharged on 11/28/12. Admitted for poor PO intake and found to chest Xray with increased pleural effusions (R>L) and possibility of pneumonia   Clinical Impression   Pt min guard A with LB ADLs due to decreased balance, cognitive deficits requiring cues. Pt refusing to return to SNF and stats that he is goinh oe with his daughter this afternoon. NOofurther acute OT services indicated at this time    OT Assessment  All further OT needs can be met in the next venue of care    Follow Up Recommendations  SNF;Supervision/Assistance - 24 hour    Barriers to Discharge      Equipment Recommendations  None recommended by OT    Recommendations for Other Services    Frequency       Precautions / Restrictions Precautions Precautions: Fall Restrictions Weight Bearing Restrictions: No   Pertinent Vitals/Pain 6/10 pain B elbows and LEs    ADL  Grooming: Performed;Wash/dry hands;Wash/dry face;Min guard Upper Body Bathing: Simulated;Supervision/safety;Set up Where Assessed - Upper Body Bathing: Unsupported sitting Lower Body Bathing: Simulated;Min guard Upper Body Dressing: Performed;Supervision/safety;Set up Where Assessed - Upper Body Dressing: Unsupported sitting Lower Body Dressing: Performed;Min guard Toilet Transfer: Min guard;Performed Armed forces technical officer Method: Sit to Loss adjuster, chartered: Grab bars;Regular height toilet Toileting - Clothing Manipulation and Hygiene: Performed;Min guard Where Assessed - Best boy  and Hygiene: Standing Tub/Shower Transfer: Performed;Min guard Clinical cytogeneticist Method: Therapist, art: Grab bars;Walk in shower Equipment Used: Gait belt Transfers/Ambulation Related to ADLs: cues for hand placement     OT Diagnosis:    OT Problem List: Decreased activity tolerance;Impaired balance (sitting and/or standing);Decreased cognition;Decreased safety awareness;Decreased knowledge of precautions;Decreased coordination;Decreased strength;Decreased knowledge of use of DME or AE OT Treatment Interventions:     OT Goals(Current goals can be found in the care plan section) Acute Rehab OT Goals Patient Stated Goal: pt states he wants to go home with dgtr  Visit Information  Last OT Received On: 12/15/13 Assistance Needed: +1 History of Present Illness: 63 y.o.male with past history of HTN, CHF (03/2013 EF 38-88% with diastolic dysfunction), lung ca s/p LL partial lobectomy in 2009, and esophageal dysmotility, adn dementia, recently admitted for pneumonia. discharged on 11/28/12. Admitted for poor PO intake and found to chest Xray with increased pleural effusions (R>L) and possibility of pneumonia       Prior Functioning     Home Living Family/patient expects to be discharged to:: Private residence Available Help at Discharge: Family Type of Home: Apartment Home Access: Stairs to enter Technical brewer of Steps: 10 steps Entrance Stairs-Rails: Right;Left;Can reach both Home Layout: One level Home Equipment: Cane - quad Prior Function Level of Independence: Needs assistance Gait / Transfers Assistance Needed: with cane per pt Communication Communication: No difficulties Dominant Hand: Right         Vision/Perception Vision - History Baseline Vision: Wears glasses only for reading Patient Visual Report: No change from baseline Perception Perception: Within Functional Limits   Cognition  Cognition Arousal/Alertness:  Awake/alert Behavior During Therapy: Flat affect Overall Cognitive Status: No family/caregiver present to determine baseline cognitive functioning Problem  Solving: Slow processing;Decreased initiation;Requires verbal cues;Requires tactile cues General Comments: pt asking for a chair that he states was in front of him. Only chair in room is recliner he is seated in    Extremity/Trunk Assessment Upper Extremity Assessment Upper Extremity Assessment: Overall WFL for tasks assessed;Generalized weakness Lower Extremity Assessment Lower Extremity Assessment: Defer to PT evaluation Cervical / Trunk Assessment Cervical / Trunk Assessment: Normal     Mobility Bed Mobility General bed mobility comments: pt up in recliner Transfers Overall transfer level: Needs assistance Equipment used: Rolling walker (2 wheeled) Transfers: Sit to/from Stand Sit to Stand: Min guard General transfer comment: cues for hand placement           Balance Balance Overall balance assessment: Needs assistance Sitting-balance support: No upper extremity supported;Feet supported Sitting balance-Leahy Scale: Good Standing balance support: During functional activity;Single extremity supported Standing balance-Leahy Scale: Fair   End of Session OT - End of Session Equipment Utilized During Treatment: Gait belt Activity Tolerance: Patient tolerated treatment well Patient left: in chair;with call bell/phone within reach  GO     Mark [name] 12/15/2013, 3:33 PM

## 2013-12-15 NOTE — Consult Note (Addendum)
Patient seen and I had a full discussion with him this morning. He asked me about his PNA and his lung cancer. He is dressed and tells me he is ready to go home. He desires treatment and to be re-evaluated by Dr. Earlie Server (not ready for Hospice if seeking treatment). Would recommend comfort PO's with consideration of risk- it is ok for him to eat what works and is desirable to him with a medical rec for ground texture-I think regardless of how you modify his diet he will be at risk. Additionally his Lung Cancer needs full evaluation-he has had a persistent PNA and decline which is very suspicious for recurrence. Will provide information on Hospice and Palliative Care services in the community. He will obtain his methadone from [facility].  Full note to follow.  Lane Hacker, DO Palliative Medicine

## 2013-12-16 ENCOUNTER — Telehealth: Payer: Self-pay | Admitting: Internal Medicine

## 2013-12-16 NOTE — Telephone Encounter (Signed)
pt called to r/s missed appt...done....pt aware of new d.t °

## 2013-12-17 ENCOUNTER — Telehealth: Payer: Self-pay | Admitting: Internal Medicine

## 2013-12-17 ENCOUNTER — Ambulatory Visit: Payer: Medicaid Other | Admitting: Physician Assistant

## 2013-12-17 ENCOUNTER — Other Ambulatory Visit: Payer: Medicaid Other

## 2013-12-17 NOTE — Telephone Encounter (Signed)
pt daughter called to r/s appt to nxt tuesday due to pt just getting out of hospital and is still weak...done.Marland KitchenMarland Kitchen

## 2013-12-19 LAB — CULTURE, BLOOD (ROUTINE X 2)
CULTURE: NO GROWTH
CULTURE: NO GROWTH

## 2013-12-21 LAB — FUNGUS CULTURE W SMEAR: Fungal Smear: NONE SEEN

## 2013-12-21 NOTE — Discharge Summary (Signed)
Agree with summary as outlined.

## 2013-12-22 ENCOUNTER — Ambulatory Visit: Payer: Medicaid Other | Admitting: Physician Assistant

## 2013-12-22 ENCOUNTER — Other Ambulatory Visit: Payer: Medicaid Other

## 2013-12-22 ENCOUNTER — Telehealth: Payer: Self-pay | Admitting: Internal Medicine

## 2013-12-22 ENCOUNTER — Other Ambulatory Visit: Payer: Self-pay | Admitting: Medical Oncology

## 2013-12-22 NOTE — Care Management Note (Signed)
    Page 1 of 1   12/22/2013     3:31:42 PM   CARE MANAGEMENT NOTE 12/22/2013  Patient:  Mark [name], Mark [name]   Account Number:  [id number]  Date Initiated:  12/22/2013  Documentation initiated by:  [facility]  Subjective/Objective Assessment:   63 y.o.male with past history of HTN, CHF (03/2013 EF 09-38% with diastolic dysfunction), lung ca s/p LL partial lobectomy in 2009, and esophageal dysmotility, adn dementia, recently admitted for pneumonia     Action/Plan:   starting treatment for HCAP//offer [facility]   Anticipated DC Date:  12/15/2013   Anticipated DC Plan:  [place]         Choice offered to / List presented to:          [facility] arranged  HH-1 RN      [place].   Status of service:  Completed, signed off Medicare Important Message given?   (If response is "NO", the following Medicare IM given date fields will be blank) Date Medicare IM given:   Date Additional Medicare IM given:    Discharge Disposition:    Per UR Regulation:    If discussed at Long Length of Stay Meetings, dates discussed:    Comments:  12/22/13 [place], RN, BSN, NCM Received call from pt daughter Mark [name] [phone]) stating that [facility]) has not had visit with her dad.  Called Janae Sauce to set up.

## 2013-12-22 NOTE — Telephone Encounter (Signed)
s.w. pt  daughter and r/s pt appt to friday per request..Marland KitchenMarland KitchenMarland Kitchenpt aware of new d.t

## 2013-12-24 ENCOUNTER — Inpatient Hospital Stay: Payer: Medicaid Other | Admitting: Internal Medicine

## 2013-12-25 ENCOUNTER — Telehealth: Payer: Self-pay | Admitting: Medical Oncology

## 2013-12-25 ENCOUNTER — Telehealth: Payer: Self-pay | Admitting: Internal Medicine

## 2013-12-25 ENCOUNTER — Other Ambulatory Visit: Payer: Medicaid Other

## 2013-12-25 ENCOUNTER — Ambulatory Visit: Payer: Medicaid Other | Admitting: Physician Assistant

## 2013-12-25 NOTE — Telephone Encounter (Signed)
[place] get pt here today for appointment . I told her he needed labs and scans first . Will ask Adrena to send [place] to r/s. Call Adrena with appointments.

## 2013-12-25 NOTE — Telephone Encounter (Signed)
Pt Emergency contact called in today to see if she could receive some assistance with pt medications; pt at this time is unable to leave the residence, however is still in need of medications; please F/U with pt emergency contact for a solution

## 2013-12-25 NOTE — Telephone Encounter (Signed)
Note to Dr Julien Nordmann.

## 2013-12-26 ENCOUNTER — Encounter (HOSPITAL_COMMUNITY): Payer: Self-pay | Admitting: Emergency Medicine

## 2013-12-26 ENCOUNTER — Emergency Department (HOSPITAL_COMMUNITY): Payer: Medicaid Other

## 2013-12-26 ENCOUNTER — Inpatient Hospital Stay (HOSPITAL_COMMUNITY)
Admission: EM | Admit: 2013-12-26 | Discharge: 2013-12-29 | DRG: 177 | Disposition: A | Discharge status: Hospice | Payer: Medicaid Other | Attending: Internal Medicine | Admitting: Internal Medicine

## 2013-12-26 DIAGNOSIS — Z66 Do not resuscitate: Secondary | ICD-10-CM | POA: Diagnosis not present

## 2013-12-26 DIAGNOSIS — I10 Essential (primary) hypertension: Secondary | ICD-10-CM | POA: Diagnosis present

## 2013-12-26 DIAGNOSIS — Z79899 Other long term (current) drug therapy: Secondary | ICD-10-CM

## 2013-12-26 DIAGNOSIS — I509 Heart failure, unspecified: Secondary | ICD-10-CM | POA: Diagnosis present

## 2013-12-26 DIAGNOSIS — E46 Unspecified protein-calorie malnutrition: Secondary | ICD-10-CM

## 2013-12-26 DIAGNOSIS — J90 Pleural effusion, not elsewhere classified: Secondary | ICD-10-CM

## 2013-12-26 DIAGNOSIS — K224 Dyskinesia of esophagus: Secondary | ICD-10-CM | POA: Diagnosis present

## 2013-12-26 DIAGNOSIS — I2510 Atherosclerotic heart disease of native coronary artery without angina pectoris: Secondary | ICD-10-CM | POA: Diagnosis present

## 2013-12-26 DIAGNOSIS — J189 Pneumonia, unspecified organism: Secondary | ICD-10-CM | POA: Diagnosis present

## 2013-12-26 DIAGNOSIS — I5042 Chronic combined systolic (congestive) and diastolic (congestive) heart failure: Secondary | ICD-10-CM | POA: Diagnosis present

## 2013-12-26 DIAGNOSIS — D638 Anemia in other chronic diseases classified elsewhere: Secondary | ICD-10-CM

## 2013-12-26 DIAGNOSIS — Z515 Encounter for palliative care: Secondary | ICD-10-CM

## 2013-12-26 DIAGNOSIS — J690 Pneumonitis due to inhalation of food and vomit: Secondary | ICD-10-CM | POA: Diagnosis present

## 2013-12-26 DIAGNOSIS — F1120 Opioid dependence, uncomplicated: Secondary | ICD-10-CM | POA: Diagnosis present

## 2013-12-26 DIAGNOSIS — Z96649 Presence of unspecified artificial hip joint: Secondary | ICD-10-CM

## 2013-12-26 DIAGNOSIS — E43 Unspecified severe protein-calorie malnutrition: Secondary | ICD-10-CM | POA: Diagnosis present

## 2013-12-26 DIAGNOSIS — Z85118 Personal history of other malignant neoplasm of bronchus and lung: Secondary | ICD-10-CM

## 2013-12-26 DIAGNOSIS — Z7982 Long term (current) use of aspirin: Secondary | ICD-10-CM

## 2013-12-26 DIAGNOSIS — Z87891 Personal history of nicotine dependence: Secondary | ICD-10-CM

## 2013-12-26 DIAGNOSIS — Z8673 Personal history of transient ischemic attack (TIA), and cerebral infarction without residual deficits: Secondary | ICD-10-CM

## 2013-12-26 LAB — CBC WITH DIFFERENTIAL/PLATELET
Basophils Absolute: 0 10*3/uL (ref 0.0–0.1)
Basophils Relative: 0 % (ref 0–1)
EOS ABS: 0.1 10*3/uL (ref 0.0–0.7)
Eosinophils Relative: 1 % (ref 0–5)
HEMATOCRIT: 32.9 % — AB (ref 39.0–52.0)
Hemoglobin: 9.4 g/dL — ABNORMAL LOW (ref 13.0–17.0)
LYMPHS ABS: 1.9 10*3/uL (ref 0.7–4.0)
LYMPHS PCT: 30 % (ref 12–46)
MCH: 19.5 pg — ABNORMAL LOW (ref 26.0–34.0)
MCHC: 28.6 g/dL — AB (ref 30.0–36.0)
MCV: 68.3 fL — ABNORMAL LOW (ref 78.0–100.0)
Monocytes Absolute: 0.3 10*3/uL (ref 0.1–1.0)
Monocytes Relative: 5 % (ref 3–12)
NEUTROS ABS: 4.1 10*3/uL (ref 1.7–7.7)
Neutrophils Relative %: 64 % (ref 43–77)
Platelets: 309 10*3/uL (ref 150–400)
RBC: 4.82 MIL/uL (ref 4.22–5.81)
RDW: 22.3 % — ABNORMAL HIGH (ref 11.5–15.5)
WBC: 6.4 10*3/uL (ref 4.0–10.5)

## 2013-12-26 LAB — COMPREHENSIVE METABOLIC PANEL
ALBUMIN: 2.3 g/dL — AB (ref 3.5–5.2)
ALT: 22 U/L (ref 0–53)
AST: 36 U/L (ref 0–37)
Alkaline Phosphatase: 106 U/L (ref 39–117)
BUN: 16 mg/dL (ref 6–23)
CHLORIDE: 100 meq/L (ref 96–112)
CO2: 29 mEq/L (ref 19–32)
CREATININE: 1.15 mg/dL (ref 0.50–1.35)
Calcium: 8.4 mg/dL (ref 8.4–10.5)
GFR calc Af Amer: 76 mL/min — ABNORMAL LOW (ref >90)
GFR calc non Af Amer: 66 mL/min — ABNORMAL LOW (ref >90)
Glucose, Bld: 160 mg/dL — ABNORMAL HIGH (ref 70–99)
Potassium: 4.1 mEq/L (ref 3.7–5.3)
Sodium: 141 mEq/L (ref 137–147)
TOTAL PROTEIN: 7.7 g/dL (ref 6.0–8.3)
Total Bilirubin: 1 mg/dL (ref 0.3–1.2)

## 2013-12-26 LAB — I-STAT TROPONIN, ED: TROPONIN I, POC: 0.05 ng/mL (ref 0.00–0.08)

## 2013-12-26 LAB — PRO B NATRIURETIC PEPTIDE: Pro B Natriuretic peptide (BNP): 3391 pg/mL — ABNORMAL HIGH (ref 0–125)

## 2013-12-26 LAB — D-DIMER, QUANTITATIVE: D-Dimer, Quant: >20 ug/mL-FEU — ABNORMAL HIGH (ref 0.00–0.48)

## 2013-12-26 MED ORDER — IOHEXOL 350 MG/ML SOLN
100.0000 mL | Freq: Once | INTRAVENOUS | Status: AC | PRN
  Administered 2013-12-26: 100 mL via INTRAVENOUS

## 2013-12-26 MED ORDER — VANCOMYCIN HCL IN DEXTROSE 1-5 GM/200ML-% IV SOLN
1000.0000 mg | Freq: Once | INTRAVENOUS | Status: AC
  Administered 2013-12-26: 1000 mg via INTRAVENOUS
  Filled 2013-12-26: qty 200

## 2013-12-26 MED ORDER — DEXTROSE 5 % IV SOLN
2.0000 g | Freq: Once | INTRAVENOUS | Status: AC
  Administered 2013-12-26: 2 g via INTRAVENOUS

## 2013-12-26 MED ORDER — ONDANSETRON HCL 4 MG/2ML IJ SOLN
4.0000 mg | Freq: Once | INTRAMUSCULAR | Status: DC
  Filled 2013-12-26: qty 2

## 2013-12-26 MED ORDER — ASPIRIN 81 MG PO CHEW
324.0000 mg | CHEWABLE_TABLET | Freq: Once | ORAL | Status: AC
  Administered 2013-12-26: 243 mg via ORAL
  Filled 2013-12-26: qty 4

## 2013-12-26 MED ORDER — ENOXAPARIN SODIUM 40 MG/0.4ML ~~LOC~~ SOLN
40.0000 mg | SUBCUTANEOUS | Status: DC
  Administered 2013-12-26 – 2013-12-28 (×3): 40 mg via SUBCUTANEOUS
  Filled 2013-12-26 (×4): qty 0.4

## 2013-12-26 MED ORDER — DEXTROSE-NACL 5-0.45 % IV SOLN
INTRAVENOUS | Status: DC
  Administered 2013-12-26 – 2013-12-29 (×4): via INTRAVENOUS

## 2013-12-26 MED ORDER — DEXTROSE 5 % IV SOLN
1.0000 g | Freq: Three times a day (TID) | INTRAVENOUS | Status: DC
  Filled 2013-12-26 (×2): qty 1

## 2013-12-26 MED ORDER — NITROGLYCERIN 0.4 MG SL SUBL
0.4000 mg | SUBLINGUAL_TABLET | SUBLINGUAL | Status: DC | PRN
  Filled 2013-12-26: qty 25

## 2013-12-26 NOTE — ED Notes (Signed)
Unsuccessful IV attempt by this RN. Dillon Bjork at bedside attempting IV insertion.

## 2013-12-26 NOTE — ED Notes (Signed)
Bed: WA10 Expected date: 12/26/13 Expected time: 10:25 AM Means of arrival: Ambulance Comments: Sh[place], recently discharged from [facility] with Pneumonia

## 2013-12-26 NOTE — ED Notes (Signed)
EMS were called from his home in the presence of his family and a caregiver (home health aide).  EMS were told that pt. Was recently d/c'd. From [facility] with pneumonia and he continues to cough and according to the home health aide "he might be aspirating".  He is awake and drowsy and in no distress.  spo2 100% on 2 l n.c.

## 2013-12-26 NOTE — ED Notes (Signed)
I have just phoned report to McKenzie, RN at [facility] 6700 unit; and we will call [facility] now for transport.

## 2013-12-26 NOTE — Progress Notes (Signed)
17:27-Pt pending admission to [place] from [facility] ED, received report from [place], Therapist, sports at [employer].  19:48-Patient still at [facility] ED at shift change, report passed on to nightshift RN on [place].

## 2013-12-26 NOTE — ED Notes (Signed)
Main lab at bedside 

## 2013-12-26 NOTE — ED Notes (Addendum)
Unsuccessful blood draw attempt x2 by main lab, Almyra Free.

## 2013-12-26 NOTE — H&P (Signed)
Date: 12/26/2013               Patient Name:  Mark [name] MRN: [id number]  DOB: 26-Aug-1951 Age / Sex: 63 y.o., male   PCP: Angelica Chessman, MD         Medical Service: Internal Medicine Teaching Service         Attending Physician: Dr. Karren Cobble, MD    First Contact: Dr. Stann Mainland Pager: [phone]  Second Contact: Dr. Algis Liming Pager: [phone]       After Hours (After 5p/  First Contact Pager: [phone]  weekends / holidays): Second Contact Pager: [phone]   Chief Complaint: SOB  History of Present Illness:  [name] is a 63yo AAM w/ PMH combined systolic and diastolic CHF (EF 02-40% 06/7352), HTN, CVA, L lung adenocarcinoma s/p L lower lobectomy in 2014 (no chemo or XRT), CAD (last cath 6/ 2014), substance abuse (on methadone) who presents w/ SOB and cough x 1 week.  Patient has some SOB at baseline, but notes that his SOB has worsened recently especially upon exertion and lying flat. He also endorses having a cough productive of thick, yellow sputum. He thinks that the cough mostly occurs when he is swallowing, whether it is solid or liquid or even his own saliva. Denies F/C, CP, abd pain, BLE edema, hemoptysis. He wears oxygen at night at home at 2.5 LPM. He notes that he has intermittent constipation and his last normal BM was Wednesday of this week.   Patient is a somewhat unclear historian. No family was present during my interview. Per chart review, he was recently admitted and treated for community acquired vs aspiration pneumonia (1/24-1/31/15) and found to have esophageal dysmotility on MBS. He was discharged to SNF. He was then re-admitted (2/14-2/17) for volume overload thought to be 2/2 CHF exacerbation at which time coreg and lasix 20mg  daily were added to his regimen. He was discharged home w/ [facility], RN.  He is living with his daughter. He spends most of his time in bed when he is at home. [place] services of some kind have been coming by to assist him. According to day team who  spoke with [place], patient's family is now becoming interested in pursuing palliative care.   Meds: Current Facility-Administered Medications  Medication Dose Route Frequency Provider Last Rate Last Dose  . [START ON 12/27/2013] ceFEPIme (MAXIPIME) 1 g in dextrose 5 % 50 mL IVPB  1 g Intravenous Q8H Jessica Mack, RPH      . ceFEPIme (MAXIPIME) 2 g in dextrose 5 % 50 mL IVPB  2 g Intravenous Once Mosetta Pigeon, RPH 100 mL/hr at 12/26/13 1953 2 g at 12/26/13 1953  . ondansetron (ZOFRAN) injection 4 mg  4 mg Intravenous Once Ephraim Hamburger, MD       Current Outpatient Prescriptions  Medication Sig Dispense Refill  . aspirin EC 81 MG EC tablet Take 1 tablet (81 mg total) by mouth daily.      . carvedilol (COREG) 3.125 MG tablet Take 1 tablet (3.125 mg total) by mouth 2 (two) times daily with a meal.  60 tablet  0  . cholecalciferol (VITAMIN D) 1000 UNITS tablet Take 1,000 Units by mouth daily.      Marland Kitchen docusate (COLACE) 50 MG/5ML liquid Take 5 mLs (50 mg total) by mouth daily.  100 mL  0  . feeding supplement, ENSURE COMPLETE, (ENSURE COMPLETE) LIQD Take 237 mLs by mouth 2 (two) times daily between meals.  [place]  Bottle  0  . feeding supplement, ENSURE, (ENSURE) PUDG Take 1 Container by mouth 2 (two) times daily after a meal.  60 Container  0  . folic acid (FOLVITE) 154 MCG tablet Take 400 mcg by mouth daily.      . furosemide (LASIX) 20 MG tablet Take 1 tablet (20 mg total) by mouth daily.  10 tablet  0  . hydrALAZINE (APRESOLINE) 10 MG tablet Take 1 tablet (10 mg total) by mouth 3 (three) times daily.  90 tablet  1  . lisinopril (PRINIVIL,ZESTRIL) 30 MG tablet Take 30 mg by mouth daily.      . methadone (DOLOPHINE) 10 MG tablet Take 50 mg by mouth daily.       . Multiple Vitamin (MULTIVITAMIN WITH MINERALS) TABS tablet Take 1 tablet by mouth daily.      Marland Kitchen nystatin (MYCOSTATIN) 100000 UNIT/ML suspension Take 5 mLs (500,000 Units total) by mouth 4 (four) times daily.  60 mL  0  . polyethylene glycol  (MIRALAX / GLYCOLAX) packet Take 17 g by mouth daily as needed for moderate constipation.      . potassium chloride SA (K-DUR,KLOR-CON) 20 MEQ tablet Take 1 tablet (20 mEq total) by mouth daily.  10 tablet  0    Allergies: Allergies as of 12/26/2013  . (No Known Allergies)   Past Medical History  Diagnosis Date  . Essential hypertension, benign 02/26/2013  . Liver lesion     a. Previously biopsy-proven to be a cyst (per records from a hospital in [place]).  . Hypoxia     a. Adm 02/2013, required home O2.  . Drug abuse   . Depression   . Seizure disorder     "after hip OR 02/2012 once I got home" (04/01/2013)  . GI bleed     a. 2009 at time of stroke - received 3 u blood, no source found per pt  . HTN (hypertension)   . On home oxygen therapy     2.5L "usually use it at night" (04/01/2013)  . Tuberculosis   . Positive TB test   . History of blood transfusion 2009    "don't know what it was related to" (04/01/2013)  . Stroke 2009    a. Pt reports several, last in 2009. b. Residual L sided weakness.  . Anxiety   . Adenocarcinoma, lung     a. Dx 2013, underwent left lower lobectomy under the care of Dr.Lapunzina in [place] for a stage I non-small cell lung CA.; "went to [facility] ~ 06/2012 and they couldn't find anything" (04/01/2013)  . Pneumonia   . Coronary atherosclerosis of native coronary artery    Past Surgical History  Procedure Laterality Date  . Hip arthroplasty Left 02/2012  . Lung biopsy Left 07/15/2012    "LLL" (04/01/2013)  . Lung removal, partial Left     /notes 02/26/2013  (04/01/2013)   Family History  Problem Relation Age of Onset  . Stroke    . CAD Neg Hx   . Hypertension Mother   . Hypertension Daughter    History   Social History  . Marital Status: Widowed    Spouse Name: N/A    Number of Children: N/A  . Years of Education: N/A   Occupational History  . Not on file.   Social History Main Topics  . Smoking status: Former Smoker -- 0.50 packs/day for 42  years    Types: Cigarettes    Quit date: 02/27/2012  . Smokeless  tobacco: Never Used  . Alcohol Use: 29.4 oz/week    14 Cans of beer, 35 Shots of liquor per week     Comment: 04/01/2013 "~ 1/2 pint /day of rum plus 2 16oz beers/day"  . Drug Use: No     Comment: Prior h/o cocaine and heroin. Currently on methadone.  . Sexual Activity: Not Currently   Other Topics Concern  . Not on file   Social History Narrative  . No narrative on file   Review of Systems: General: no fevers, chills, changes in appetite Skin: no rash HEENT: no ST, HA Pulm: See HPI CV: +SOB, no CP, palpitations Abd: +constipation; no abdominal pain, nausea/vomiting, diarrhea GU: no dysuria Ext: no arthralgias, myalgias Neuro: no weakness, numbness, or tingling  Physical Exam: Blood pressure 136/90, pulse 85, temperature 98.3 F (36.8 C), temperature source Oral, resp. rate 13, SpO2 100.00%.  General: alert, very talkative, cooperative, and in no apparent distress HEENT: NCAT, vision grossly intact, oropharynx clear and non-erythematous, MMM Neck: supple Lungs: diminished breath sounds to L base as well as throughout entire R lung, no rhonchi, wheezing, rales Heart: regular rate and rhythm, no murmurs, gallops, or rubs Abdomen: soft and thin, non-tender, non-distended, normal bowel sounds Extremities: cool BLE with weak TP pulses, DP pulses not palpable; no BLE edema; diffuse xerosis Neurologic: alert & oriented X3, though at times gives somewhat unclear and tangential responses, cranial nerves II-XII grossly intact, strength grossly intact, sensation intact to light touch  Lab results: Basic Metabolic Panel:  Recent Labs  12/26/13 1231  NA 141  K 4.1  CL 100  CO2 29  GLUCOSE 160*  BUN 16  CREATININE 1.15  CALCIUM 8.4  AG 12 (Cr baseline 1.0-1.2)  Liver Function Tests:  Recent Labs  12/26/13 1231  AST 36  ALT 22  ALKPHOS 106  BILITOT 1.0  PROT 7.7  ALBUMIN 2.3*   CBC:  Recent Labs   12/26/13 1231  WBC 6.4  NEUTROABS 4.1  HGB 9.4*  HCT 32.9*  MCV 68.3*  PLT 309  (Hb 9.7 on 12/14/13)  BNP:  Recent Labs  12/26/13 1231  PROBNP 3391.0*  (proBNP 3564 12/12/13)  D-Dimer:  Recent Labs  12/26/13 1231  DDIMER >20.00*   Imaging results:  Dg Chest 2 View  12/26/2013   CLINICAL DATA:  Shortness of breath, productive cough and vomiting  EXAM: CHEST  2 VIEW  COMPARISON:  DG CHEST 2 VIEW dated 12/12/2013; CT CHEST W/CM dated 11/21/2013  FINDINGS: Heart size upper normal and stable. There is persistent opacity in the right infrahilar region. There remains a small right pleural effusion. There is persistent infiltrate in the left perihilar area although it is improved moderately when compared to the prior study. There remains a small left pleural effusion as well.  IMPRESSION: Stable right infiltrate and small effusion. Persistent but improved left infiltrate and small effusion. Findings continue to be suspicious for pneumonia, but should be followed to ensure complete resolution.   Electronically Signed   By: Skipper Cliche M.D.   On: 12/26/2013 11:29   Ct Angio Chest Pe W/cm &/or Wo Cm  12/26/2013   CLINICAL DATA:  Elevated D-dimer, rule out pulmonary embolism.  EXAM: CT ANGIOGRAPHY CHEST WITH CONTRAST  TECHNIQUE: Multidetector CT imaging of the chest was performed using the standard protocol during bolus administration of intravenous contrast. Multiplanar CT image reconstructions and MIPs were obtained to evaluate the vascular anatomy.  CONTRAST:  159mL OMNIPAQUE IOHEXOL 350 MG/ML SOLN  COMPARISON:  11/21/2013 and 02/26/2013  FINDINGS: There is mild paraseptal emphysematous disease over the apices and bases. Lungs are adequately inflated and continue demonstrated moderate size right pleural effusion slightly worse. There is a very small amount of left pleural fluid slightly worse. There is a persistent patchy bilateral airspace process with mild overall worsening most notably over  the left lower lobe. This likely represents a worsening infectious process. There is moderate cardiomegaly. Continued evidence of calcified plaque over the left anterior descending and lateral circumflex arteries. Continued evidence of mild adenopathy over the pretracheal region and subcarinal region without significant change likely reactive. Pulmonary arterial system is well opacified. Somewhat equivocal finding of possible very distal filling defect over the right posterior basilar pulmonary artery as favor this to be artifactual in nature. Remainder of the pulmonary arterial system is within normal. Remaining mediastinal structures are unremarkable. There is moderate fluid and debris within the esophagus unchanged likely due to dysmotility versus reflux.  Images through the upper abdomen are unchanged. Remainder the exam is unchanged.  Review of the MIP images confirms the above findings.  IMPRESSION: Persistent bilateral patchy airspace process with overall worsening compared to the prior exam particularly over the left lower lobe likely an ongoing infectious process. Persistent moderate size right pleural effusions slightly worse. Tiny left pleural effusion slightly worse. Mild emphysematous disease.  No definite pulmonary emboli.  Cardiomegaly.  Minimal atherosclerotic coronary artery disease.  Moderate fluid and debris within the esophagus likely due to reflux versus dysmotility.   Electronically Signed   By: Marin Olp M.D.   On: 12/26/2013 15:07    Other results: EKG: NSR, regular axis and intervals, Q waves in leads V1, V2; LVH; no change from prior.  Assessment & Plan by Problem:  Presumed aspiration pneumonia - Hx of tx of CAP vs aspiration PNA in the past with slightly worse effusion on chest CT today with minor change of infiltrate in lower left lobe. No evidence of PE. Patient has esophageal dysmotility, which puts him at risk for aspiration. Patient also reports coughing every time he  swallows while at home recently. Started cefepime and vancomycin in ED for HCAP coverage. Would stop these and continue with zosyn alone to cover aspiration PNA. No fever or hypoxia or leukocytosis.  -Zosyn  -Stop cefepime and vancomycin  -Speech evaluation, NPO for now (can reconsider if family/patient would like to continue dysphagia diet) -BCX x 2 pending -influenza panel pending  -PT eval  Pleural effusion - In the past thought secondary to CHF although patient does not appear to be volume overloaded at this time. Perhaps 2/2 chronic aspiration. Right effusion is larger than left with some changes on CT from previous. Likely causing some of his exertional dyspnea.   Chronic combined systolic and diastolic heart failure, not in exacerbation - Last Echo showed grade 2 diastolic dysfunction and EF 35-40% (03/2013). Will hold oral medications at this time pending repeat swallow evaluation. Not in need of diuresis at this time. On coreg 3.125mg  BID, hydralazine 10mg  TID, lasix 20mg  daily, ASA, lisinopril 30mg  daily at home.  -hold home meds for now until SLP eval  Esophageal dysmotility - Placed on dysphagia diet during last admission and likely still aspirating despite this diet. Unclear adherence to dietary recommendations. Ongoing palliative discussions with the patient and his daughter (daughter not present for discussion at time of admission).  -SLP eval  -NPO pending speech evaluation or definitive discussion clarifying that patient and family wish him to continue with diet  despite ongoing aspiration.   Hypertension - BP controlled in ED and will hold oral antihypertensives while NPO. On hydralazine, lisinopril, lasix and coreg at home.   Protein calorie malnutrition - Ensure BID at home although patient states he only uses it once per day. Albumin 2.3 on admission. Will resume Ensure when appropriate.   History of substance abuse - Currently taking methadone 50 mg daily and will hold until  taking orals and monitor closely for signs of withdrawal.   DVT ppx - Lovenox [place] daily   Dispo: Disposition is deferred at this time, awaiting improvement of current medical problems. Anticipated discharge in approximately 2-3 day(s).   The patient does have a current PCP (Angelica Chessman, MD) and does not need an [facility] hospital follow-up appointment after discharge.  The patient does not have transportation limitations that hinder transportation to clinic appointments.  Signed: Rebecca Eaton, MD 12/26/2013, 8:04 PM

## 2013-12-26 NOTE — ED Notes (Signed)
Main lab verbalizes will attempt blood culture draw.

## 2013-12-26 NOTE — ED Notes (Signed)
As I write this, he has just been taken to [facility].  Dr. Regenia Skeeter started a#20 x 1.88in. Saline lock with u/s guidance in right a/c.  Pt. Remains in no distress; and his chief wish at this time is something to eat.

## 2013-12-26 NOTE — Progress Notes (Signed)
  Pt orientation to unit, room and routine. Information packet given to patient and safety video watched.  Admission INP armband ID verified with patient and in place. Side rails in place, fall risk assessment complete with patient verbalizing understanding of risks associated with falls. Pt verbalizes an understanding of how to use the call bell and to call for help before getting out of bed.   Will cont to monitor and assist as needed.  Velora Mediate, RN 12/26/2013 8:49 PM

## 2013-12-26 NOTE — Progress Notes (Signed)
ANTIBIOTIC CONSULT NOTE - INITIAL  Pharmacy Consult for cefepime Indication: rule out pneumonia  No Known Allergies  Patient Measurements: Body weight= 57.2kg on 12/26/23 IBW= 66.1kg  Vital Signs: Temp: 98.2 F (36.8 C) (02/28 1049) Temp src: Oral (02/28 1049) BP: 133/82 mmHg (02/28 1049) Pulse Rate: 82 (02/28 1049)  Labs:  Recent Labs  12/26/13 1231  WBC 6.4  HGB 9.4*  PLT 309  CREATININE 1.15   Medical History: Past Medical History  Diagnosis Date  . Essential hypertension, benign 02/26/2013  . Liver lesion     a. Previously biopsy-proven to be a cyst (per records from a hospital in [place]).  . Hypoxia     a. Adm 02/2013, required home O2.  . Drug abuse   . Depression   . Seizure disorder     "after hip OR 02/2012 once I got home" (04/01/2013)  . GI bleed     a. 2009 at time of stroke - received 3 u blood, no source found per pt  . HTN (hypertension)   . On home oxygen therapy     2.5L "usually use it at night" (04/01/2013)  . Tuberculosis   . Positive TB test   . History of blood transfusion 2009    "don't know what it was related to" (04/01/2013)  . Stroke 2009    a. Pt reports several, last in 2009. b. Residual L sided weakness.  . Anxiety   . Adenocarcinoma, lung     a. Dx 2013, underwent left lower lobectomy under the care of Dr.Lapunzina in [place] for a stage I non-small cell lung CA.; "went to [facility] ~ 06/2012 and they couldn't find anything" (04/01/2013)  . Pneumonia   . Coronary atherosclerosis of native coronary artery     Medications:  Scheduled:  . ondansetron (ZOFRAN) IV  4 mg Intravenous Once   Infusions:  . ceFEPime (MAXIPIME) IV    . vancomycin     Assessment: 88 yoM w PMH significant for tuberculosis, NSCLC s/p partial lobectomy in 2013, seen in ED 2/28 with suspected pneumonia. Pt with 2 recent hospital admissions, one for combined diastolic and systolic HF exacerbation,a nd one for aspiration pneumonia. Pharmacy has been consulted to  dose cefepime for suspected HCAP.  Noted patient is to be transferred to [facility] for IMTS Pt with 1g vancomycin IV ordered to give on [facility] ED  2/28 >> vancomycin x1 2/28 >> cefepime >>    Tmax: afebrile WBCs: WNL Renal: Scr 1.15 (baseline ~1.0), CrCl 53 ml/min CG, 66 ml/min normlaized  2/28 blood x2: ordered 2/28 sputum: ordered   Drug level / dose changes info:  2/28: D1 cefepime 2g then 1g q8h for HCAP. Pt also give vanc 1g in ED   Goal of Therapy:  eradication of infection  Plan:  - cefepime 2g IV x1 - cefepime 1g IV q8h to start at midnight - follow-up MRSA coverage continuation - follow-up clinical course, culture results, renal function - follow-up antibiotic de-escalation and length of therapy  Thank you for the consult.  Johny Drilling, PharmD, BCPS Pager: [phone] Pharmacy: [phone] 12/26/2013 4:05 PM

## 2013-12-26 NOTE — ED Provider Notes (Signed)
CSN: [id number]     Arrival date & time 12/26/13  1026 History   First MD Initiated Contact with Patient 12/26/13 1043     Chief Complaint  Patient presents with  . Shortness of Breath     (Consider location/radiation/quality/duration/timing/severity/associated sxs/prior Treatment) HPI Comments: 63 year old male presents from home with shortness of breath. History is provided by the daughter the patient is confused from his baseline. The daughter states patient has been admitted twice in the past month for shortness of breath and pneumonia. He has finished all his antibiotics. He started having more shortness of breath last 3 days with a productive cough. No fevers. He is also been complaining of chest pain since last night. This is been no leg swelling.Last time he was admitted there was a lot of talk of palliative care which the family said she then but then nothing was set up. The family feels like she cannot take care of the patient because he is so short of breath at home and unable to take care of himself.   Past Medical History  Diagnosis Date  . Essential hypertension, benign 02/26/2013  . Liver lesion     a. Previously biopsy-proven to be a cyst (per records from a hospital in [place]).  . Hypoxia     a. Adm 02/2013, required home O2.  . Drug abuse   . Depression   . Seizure disorder     "after hip OR 02/2012 once I got home" (04/01/2013)  . GI bleed     a. 2009 at time of stroke - received 3 u blood, no source found per pt  . HTN (hypertension)   . On home oxygen therapy     2.5L "usually use it at night" (04/01/2013)  . Tuberculosis   . Positive TB test   . History of blood transfusion 2009    "don't know what it was related to" (04/01/2013)  . Stroke 2009    a. Pt reports several, last in 2009. b. Residual L sided weakness.  . Anxiety   . Adenocarcinoma, lung     a. Dx 2013, underwent left lower lobectomy under the care of Dr.Lapunzina in [place] for a stage I non-small cell  lung CA.; "went to [facility] ~ 06/2012 and they couldn't find anything" (04/01/2013)  . Pneumonia   . Coronary atherosclerosis of native coronary artery    Past Surgical History  Procedure Laterality Date  . Hip arthroplasty Left 02/2012  . Lung biopsy Left 07/15/2012    "LLL" (04/01/2013)  . Lung removal, partial Left     /notes 02/26/2013  (04/01/2013)   Family History  Problem Relation Age of Onset  . Stroke    . CAD Neg Hx   . Hypertension Mother   . Hypertension Daughter    History  Substance Use Topics  . Smoking status: Former Smoker -- 0.50 packs/day for 42 years    Types: Cigarettes    Quit date: 02/27/2012  . Smokeless tobacco: Never Used  . Alcohol Use: 29.4 oz/week    14 Cans of beer, 35 Shots of liquor per week     Comment: 04/01/2013 "~ 1/2 pint /day of rum plus 2 16oz beers/day"    Review of Systems  Unable to perform ROS: Mental status change      Allergies  Review of patient's allergies indicates no known allergies.  Home Medications   Current Outpatient Rx  Name  Route  Sig  Dispense  Refill  .  aspirin EC 81 MG EC tablet   Oral   Take 1 tablet (81 mg total) by mouth daily.         . carvedilol (COREG) 3.125 MG tablet   Oral   Take 1 tablet (3.125 mg total) by mouth 2 (two) times daily with a meal.   60 tablet   0   . cholecalciferol (VITAMIN D) 1000 UNITS tablet   Oral   Take 1,000 Units by mouth daily.         Marland Kitchen docusate (COLACE) 50 MG/5ML liquid   Oral   Take 5 mLs (50 mg total) by mouth daily.   100 mL   0   . feeding supplement, ENSURE COMPLETE, (ENSURE COMPLETE) LIQD   Oral   Take 237 mLs by mouth 2 (two) times daily between meals.   60 Bottle   0   . feeding supplement, ENSURE, (ENSURE) PUDG   Oral   Take 1 Container by mouth 2 (two) times daily after a meal.   60 Container   0   . folic acid (FOLVITE) 295 MCG tablet   Oral   Take 400 mcg by mouth daily.         . furosemide (LASIX) 20 MG tablet   Oral   Take 1 tablet  (20 mg total) by mouth daily.   10 tablet   0   . hydrALAZINE (APRESOLINE) 10 MG tablet   Oral   Take 1 tablet (10 mg total) by mouth 3 (three) times daily.   90 tablet   1   . lisinopril (PRINIVIL,ZESTRIL) 30 MG tablet   Oral   Take 30 mg by mouth daily.         . methadone (DOLOPHINE) 10 MG tablet   Oral   Take 50 mg by mouth daily.          . Multiple Vitamin (MULTIVITAMIN WITH MINERALS) TABS tablet   Oral   Take 1 tablet by mouth daily.         Marland Kitchen nystatin (MYCOSTATIN) 100000 UNIT/ML suspension   Oral   Take 5 mLs (500,000 Units total) by mouth 4 (four) times daily.   60 mL   0   . polyethylene glycol (MIRALAX / GLYCOLAX) packet   Oral   Take 17 g by mouth daily as needed for moderate constipation.         . potassium chloride SA (K-DUR,KLOR-CON) 20 MEQ tablet   Oral   Take 1 tablet (20 mEq total) by mouth daily.   10 tablet   0    BP 133/82  Pulse 82  Temp(Src) 98.2 F (36.8 C) (Oral)  Resp 20  SpO2 100% Physical Exam  Nursing note and vitals reviewed. Constitutional: He is oriented to person, place, and time. He appears well-developed and well-nourished.  HENT:  Head: Normocephalic and atraumatic.  Right Ear: External ear normal.  Left Ear: External ear normal.  Nose: Nose normal.  Eyes: Right eye exhibits no discharge. Left eye exhibits no discharge.  Neck: Neck supple.  Cardiovascular: Normal rate, regular rhythm, normal heart sounds and intact distal pulses.   Pulmonary/Chest: Effort normal. He has decreased breath sounds in the right middle field and the right lower field.  Abdominal: Soft. He exhibits no distension. There is no tenderness.  Musculoskeletal: He exhibits no edema.  Neurological: He is alert and oriented to person, place, and time.  Skin: Skin is warm and dry.    ED Course  Procedures (including  critical care time) Labs Review Labs Reviewed  CBC WITH DIFFERENTIAL - Abnormal; Notable for the following:    Hemoglobin  9.4 (*)    HCT 32.9 (*)    MCV 68.3 (*)    MCH 19.5 (*)    MCHC 28.6 (*)    RDW 22.3 (*)    All other components within normal limits  COMPREHENSIVE METABOLIC PANEL - Abnormal; Notable for the following:    Glucose, Bld 160 (*)    Albumin 2.3 (*)    GFR calc non Af Amer 66 (*)    GFR calc Af Amer 76 (*)    All other components within normal limits  D-DIMER, QUANTITATIVE - Abnormal; Notable for the following:    D-Dimer, Quant >20.00 (*)    All other components within normal limits  PRO B NATRIURETIC PEPTIDE - Abnormal; Notable for the following:    Pro B Natriuretic peptide (BNP) 3391.0 (*)    All other components within normal limits  CULTURE, BLOOD (ROUTINE X 2)  CULTURE, BLOOD (ROUTINE X 2)  INFLUENZA PANEL BY PCR (TYPE A & B, H1N1)  I-STAT TROPOININ, ED   Imaging Review Dg Chest 2 View  12/26/2013   CLINICAL DATA:  Shortness of breath, productive cough and vomiting  EXAM: CHEST  2 VIEW  COMPARISON:  DG CHEST 2 VIEW dated 12/12/2013; CT CHEST W/CM dated 11/21/2013  FINDINGS: Heart size upper normal and stable. There is persistent opacity in the right infrahilar region. There remains a small right pleural effusion. There is persistent infiltrate in the left perihilar area although it is improved moderately when compared to the prior study. There remains a small left pleural effusion as well.  IMPRESSION: Stable right infiltrate and small effusion. Persistent but improved left infiltrate and small effusion. Findings continue to be suspicious for pneumonia, but should be followed to ensure complete resolution.   Electronically Signed   By: Skipper Cliche M.D.   On: 12/26/2013 11:29     EKG Interpretation   Date/Time:  Saturday December 26 2013 11:49:18 EST Ventricular Rate:  80 PR Interval:  182 QRS Duration: 90 QT Interval:  430 QTC Calculation: 496 R Axis:   29 Text Interpretation:  Sinus rhythm LVH with secondary repolarization  abnormality Anterior Q waves, possibly due to  LVH Baseline wander in  lead(s) V3 V4 Nonspecific ST and T wave abnormality Confirmed by Lincoln Kleiner   MD, Selestino Nila ([id number]) on 12/26/2013 2:37:03 PM      Angiocath insertion Performed by: Sherwood Gambler T  Consent: Verbal consent obtained. Risks and benefits: risks, benefits and alternatives were discussed Time out: Immediately prior to procedure a "time out" was called to verify the correct patient, procedure, equipment, support staff and site/side marked as required.  Preparation: Patient was prepped and draped in the usual sterile fashion.  Vein Location: right brachial  Ultrasound Guided  Gauge: 20  Normal blood return and flush without difficulty Patient tolerance: Patient tolerated the procedure well with no immediate complications.    MDM   Final diagnoses:  Pneumonia    Patient does not have increased work or breathing here while at rest but per family is markedly more short of breath over the last few days. Due to his recent admission a d-dimer sent and is markedly positive. CTA shows no PE but does show worsening pneumonia. Given his elevated BNP with this pneumonia he will need readmission. Given broad-spectrum antibiotics and will admit to [facility] with the internal medicine teaching service.  Ephraim Hamburger, MD 12/26/13 [phone]

## 2013-12-26 NOTE — ED Notes (Addendum)
Blood draw by Dillon Bjork. Unable to obtain IV access.

## 2013-12-26 NOTE — Progress Notes (Signed)
63yo male c/o SOB and cough x1wk that worsens with exertion and lying flat, states it may be associated with swallowing solid/liquid/saliva, has h/o esophageal dysmotility, CT c/w ongoing infectious process, to begin IV ABX for aspiration PNA.  Rec'd cefepime 2g IV in ED; will continue with Zosyn 3.375g IV Q8H and monitor CBC, Cx.  Wynona Neat, PharmD, BCPS 12/27/2013 12:07 AM

## 2013-12-26 NOTE — ED Notes (Signed)
Baxter Flattery, daughter contact number [phone]

## 2013-12-27 DIAGNOSIS — Z515 Encounter for palliative care: Secondary | ICD-10-CM

## 2013-12-27 DIAGNOSIS — Z66 Do not resuscitate: Secondary | ICD-10-CM | POA: Diagnosis not present

## 2013-12-27 DIAGNOSIS — J189 Pneumonia, unspecified organism: Secondary | ICD-10-CM

## 2013-12-27 LAB — INFLUENZA PANEL BY PCR (TYPE A & B)
H1N1 flu by pcr: NOT DETECTED
Influenza A By PCR: NEGATIVE
Influenza B By PCR: NEGATIVE

## 2013-12-27 MED ORDER — LISINOPRIL 40 MG PO TABS
40.0000 mg | ORAL_TABLET | Freq: Every day | ORAL | Status: DC
  Administered 2013-12-27 – 2013-12-29 (×3): 40 mg via ORAL
  Filled 2013-12-27 (×3): qty 1

## 2013-12-27 MED ORDER — DIAZEPAM 5 MG/ML IJ SOLN
5.0000 mg | INTRAMUSCULAR | Status: DC | PRN
  Administered 2013-12-29: 5 mg via INTRAVENOUS
  Filled 2013-12-27 (×2): qty 2

## 2013-12-27 MED ORDER — FUROSEMIDE 20 MG PO TABS
20.0000 mg | ORAL_TABLET | Freq: Every day | ORAL | Status: DC
  Administered 2013-12-27 – 2013-12-29 (×3): 20 mg via ORAL
  Filled 2013-12-27 (×3): qty 1

## 2013-12-27 MED ORDER — CARVEDILOL 3.125 MG PO TABS
3.1250 mg | ORAL_TABLET | Freq: Two times a day (BID) | ORAL | Status: DC
  Administered 2013-12-27 – 2013-12-29 (×5): 3.125 mg via ORAL
  Filled 2013-12-27 (×6): qty 1

## 2013-12-27 MED ORDER — PIPERACILLIN-TAZOBACTAM 3.375 G IVPB
3.3750 g | Freq: Three times a day (TID) | INTRAVENOUS | Status: DC
  Administered 2013-12-27 – 2013-12-28 (×4): 3.375 g via INTRAVENOUS
  Filled 2013-12-27 (×6): qty 50

## 2013-12-27 MED ORDER — METHADONE HCL 40 MG PO TBSO: 40.0000 mg | ORAL_TABLET | Freq: Every day | ORAL | Status: DC

## 2013-12-27 MED ORDER — METHADONE HCL 5 MG PO TABS
40.0000 mg | ORAL_TABLET | Freq: Every day | ORAL | Status: DC
  Administered 2013-12-27 – 2013-12-29 (×3): 40 mg via ORAL
  Filled 2013-12-27 (×3): qty 8

## 2013-12-27 NOTE — Progress Notes (Signed)
Subjective: Mark [name] is delusional this morning, unable to answer many questions.  Oriented x 3.  No pain but asking to eat.   Spoke to patient's daughter who would like to proceed with palliative care consult for likely comfort care.   Objective: Vital signs in last 24 hours: Filed Vitals:   12/26/13 1956 12/26/13 2038 12/27/13 0454 12/27/13 0825  BP: 136/90 135/86 121/82 129/78  Pulse: 85 85 87 83  Temp: 98.3 F (36.8 C) 99 F (37.2 C) 97.9 F (36.6 C) 97.6 F (36.4 C)  TempSrc:  Oral Oral Axillary  Resp: 13 15 16 16   Height:  5\' 11"  (1.803 m)    Weight:  128 lb 15.5 oz (58.5 kg)    SpO2: 100% 97% 100% 99%   Weight change:   Intake/Output Summary (Last 24 hours) at 12/27/13 1039 Last data filed at 12/27/13 0826  Gross per 24 hour  Intake      0 ml  Output    150 ml  Net   -150 ml   PEX General: alert but delirious, cooperative, and in no apparent distress HEENT: NCAT, vision grossly intact, oropharynx clear and non-erythematous  Neck: supple, no lymphadenopathy Lungs: diminished breath sounds especially at right lung base, normal work of respiration, no wheezes, rales, ronchi Heart: regular rate and rhythm, no murmurs, gallops, or rubs Abdomen: soft, non-tender, non-distended, normal bowel sounds Extremities: nonpalpable DP pulses, no cyanosis, clubbing, or edema Neurologic: alert & oriented X3, cranial nerves II-XII intact, strength grossly intact, sensation intact to light touch  Lab Results: Basic Metabolic Panel:  Recent Labs Lab 12/26/13 1231  NA 141  K 4.1  CL 100  CO2 29  GLUCOSE 160*  BUN 16  CREATININE 1.15  CALCIUM 8.4   Liver Function Tests:  Recent Labs Lab 12/26/13 1231  AST 36  ALT 22  ALKPHOS 106  BILITOT 1.0  PROT 7.7  ALBUMIN 2.3*   CBC:  Recent Labs Lab 12/26/13 1231  WBC 6.4  NEUTROABS 4.1  HGB 9.4*  HCT 32.9*  MCV 68.3*  PLT 309   BNP:  Recent Labs Lab 12/26/13 1231  PROBNP 3391.0*   D-Dimer:  Recent  Labs Lab 12/26/13 1231  DDIMER >20.00*   Studies/Results: Dg Chest 2 View  12/26/2013   CLINICAL DATA:  Shortness of breath, productive cough and vomiting  EXAM: CHEST  2 VIEW  COMPARISON:  DG CHEST 2 VIEW dated 12/12/2013; CT CHEST W/CM dated 11/21/2013  FINDINGS: Heart size upper normal and stable. There is persistent opacity in the right infrahilar region. There remains a small right pleural effusion. There is persistent infiltrate in the left perihilar area although it is improved moderately when compared to the prior study. There remains a small left pleural effusion as well.  IMPRESSION: Stable right infiltrate and small effusion. Persistent but improved left infiltrate and small effusion. Findings continue to be suspicious for pneumonia, but should be followed to ensure complete resolution.   Electronically Signed   By: Skipper Cliche M.D.   On: 12/26/2013 11:29   Ct Angio Chest Pe W/cm &/or Wo Cm  12/26/2013   CLINICAL DATA:  Elevated D-dimer, rule out pulmonary embolism.  EXAM: CT ANGIOGRAPHY CHEST WITH CONTRAST  TECHNIQUE: Multidetector CT imaging of the chest was performed using the standard protocol during bolus administration of intravenous contrast. Multiplanar CT image reconstructions and MIPs were obtained to evaluate the vascular anatomy.  CONTRAST:  144mL OMNIPAQUE IOHEXOL 350 MG/ML SOLN  COMPARISON:  11/21/2013  and 02/26/2013  FINDINGS: There is mild paraseptal emphysematous disease over the apices and bases. Lungs are adequately inflated and continue demonstrated moderate size right pleural effusion slightly worse. There is a very small amount of left pleural fluid slightly worse. There is a persistent patchy bilateral airspace process with mild overall worsening most notably over the left lower lobe. This likely represents a worsening infectious process. There is moderate cardiomegaly. Continued evidence of calcified plaque over the left anterior descending and lateral circumflex  arteries. Continued evidence of mild adenopathy over the pretracheal region and subcarinal region without significant change likely reactive. Pulmonary arterial system is well opacified. Somewhat equivocal finding of possible very distal filling defect over the right posterior basilar pulmonary artery as favor this to be artifactual in nature. Remainder of the pulmonary arterial system is within normal. Remaining mediastinal structures are unremarkable. There is moderate fluid and debris within the esophagus unchanged likely due to dysmotility versus reflux.  Images through the upper abdomen are unchanged. Remainder the exam is unchanged.  Review of the MIP images confirms the above findings.  IMPRESSION: Persistent bilateral patchy airspace process with overall worsening compared to the prior exam particularly over the left lower lobe likely an ongoing infectious process. Persistent moderate size right pleural effusions slightly worse. Tiny left pleural effusion slightly worse. Mild emphysematous disease.  No definite pulmonary emboli.  Cardiomegaly.  Minimal atherosclerotic coronary artery disease.  Moderate fluid and debris within the esophagus likely due to reflux versus dysmotility.   Electronically Signed   By: Marin Olp M.D.   On: 12/26/2013 15:07   Medications: I have reviewed the patient's current medications. Scheduled Meds: . enoxaparin (LOVENOX) injection  40 mg Subcutaneous Q24H  . methadone  40 mg Oral Daily  . piperacillin-tazobactam (ZOSYN)  IV  3.375 g Intravenous Q8H   Continuous Infusions: . dextrose 5 % and 0.45% NaCl 75 mL/hr at 12/26/13 2251   PRN Meds:. Assessment/Plan: #Presumed aspiration pneumonia- Treatment for CAP vs aspiration PNA in past with slightly worse effusion (see below) on admission CT chest with minor change of infiltrate in lower left lobe.  No evidence of PE. Patient has esophageal dysmotility which puts him at risk for aspiration.  He reports coughing every  time he swallows (even secretions) at home recently.  Cefepime and vancomycin initiated in ED for HCAP coverage but transitioned to Zosyn alone for aspiration PNA on admission.  No fever, leucocytosis, no tachycardia, no tachypnea (no SIRS criteria).  Flu panel negative.  Discontinued SLP eval as patient's daughter would like to pursue palliative care.    -palliative care consult for goals of care discussion -continue Zosyn  -D5NS at 75 cc/hr -dysphagia 1 diet comfort feeds, nursing to assist with feeds -blood cultures x 2 pending  -need to check O2 sats off O2  #Pleural effusion- In the past thought secondary to CHF although patient does not appear to be volume overloaded at this time.  Perhaps 2/2 chronic aspiration.  Right effusion is larger than left with some changes on CT from previous. Likely causing some of his exertional dyspnea.   #Esophageal dysmotility- Placed on dysphagia diet during last admission and likely still aspirating despite this diet.  Unclear adherence to dietary recommendations.  -palliative care consult per above  #Chronic combined systolic and diastolic heart failure- Patient does not appear volume overloaded at this time.  Last echo in 03/2013 showed grade 2 diastolic dysfunction and EF 35-40%.  pBNP stable, not in need of diuresis at this  time.  On coreg 3.125mg  BID, lisinopril 30 mg daily, Lasix 20mg  daily, hydralazine 10mg  TID, ASA at home.  -continue Coreg, lisinopril, Lasix at home doses -holding hydralazine for now  #HTN- Stable. On hydralazine, lisinopril, lasix and coreg at home.  -continue most home meds per above  #History of substance abuse- Currently taking methadone 50 mg daily at home. -methadone 40 mg ODT daily  #Protein calorie malnutrition- Ensure BID at home though patient states he only uses it once per day. Albumin 2.3 on admission.   -resume Ensure   Dispo: Disposition is deferred at this time, awaiting improvement of current medical  problems.  Anticipated discharge in approximately 1-2 day(s).   The patient does have a current PCP (Angelica Chessman, MD) and does need an [facility] hospital follow-up appointment after discharge.   .Services Needed at time of discharge: Y = Yes, Blank = No PT:   OT:   RN:   Equipment:   Other:     LOS: 1 day   Ivin Poot, MD 12/27/2013, 10:39 AM

## 2013-12-27 NOTE — H&P (Signed)
Internal Medicine Attending Admission Note Date: 12/27/2013  Patient name: Broughton Eppinger Medical record number: [id number] Date of birth: 1951-05-22 Age: 63 y.o. Gender: male  I saw and evaluated the patient. I reviewed the resident's note and I agree with the resident's findings and plan as documented in the resident's note.  Mr. Vignola is a 63 year old man with a history of combined systolic and diastolic heart failure, hypertension, coronary artery disease, substance abuse on methadone, history of cerebrovascular accident, and non-small cell lung cancer status post a left lower lobectomy in 2014 who presents with a several day history of progressive dyspnea that is worsened with exertion or lying flat and associated with a cough productive of a thick yellow sputum. He notes that any time he eats or drinks he will cough. He also was recently diagnosed with esophageal dysmotility and was placed on a dysphagia diet. It is unclear if he's been compliant with his dysphasia diet. He is currently living with his daughter and is assisted by home health services. According to the [facility] ED, who spoke with the daughter, the family is now interested in discussing palliative care options. He was admitted to the internal medicine teaching service to treat his presumed aspiration pneumonia and facilitate the palliative care discussion.  In the emergency department he received a CT scan of the chest which was notable for a new left basilar infiltrate compared to the previous CT scan in January 2015. This is the presumed aspiration pneumonia that is being treated this admission. He also has a small effusion on the left, a moderate effusion on the right, and some basilar groundglass changes on the right which are not significantly different from the previous CT scan from January of this year.  Upon examination this morning he is somewhat confused as to place and time. He does not consistently answer all questions. That  being said, he is awake and alert and does follow some commands.  Mr. Petsch will be treated for an aspiration pneumonia with intravenous Zosyn. A speech evaluation will be requested. We will also arrange with the family time to discuss goals of care and palliative care upon discharge. Although he does not appear to have a decompensated systolic or diastolic cardiomyopathy at this time we will increase his lisinopril to 40 mg daily, continue his Lasix, and continue his Coreg at 3.125 mg twice daily. We will hold the hydralazine and titrate up the Coreg in order to maintain tight control of his afterload. By holding the hydralazine it allows [place] to titrate up the medication which has significant benefit in cardiomyopathy since hydralazine without nitrates does not have the same benefits. This will also allow [place] to simplify his regimen and maintain control of his cardiomyopathy so as not to decrease his quality of life. I anticipate Mr. Hershberger will require inpatient care for the next 24-48 hours as we observe his response to the antibiotics and sort out the palliative care issues with the family.

## 2013-12-27 NOTE — Consult Note (Addendum)
Palliative Medicine Team Consult Note  1. CVA, late effects, chronic aspiration 2. GHF 3. Adenocarcinoma of the Lung 4. Prior substance abuse history 5. HTN 6. CAD 7. Chronic leg pain, PVD 8. Recurrent PNA  I spoke with Mark [name], [name], sister who is his primary caregiver. Given his recurrent aspiration, esophageal dysmotility, multiple chronic medical problems, worsening mental status and failure to thrive she would like to have Hospice Care at home.  Summary of Goals/Recommendations:  1. DNR 2. Comfort and QOL primary goals 3. Avoid rehospitalization 4. Comfort feeding 5. Consistent access to his methadone- he has physically been unable to go to the methadone clinic due to his poor health-he is taking methadone technically for prior opiate abuse but in reality it seems that he has severe chronic pain- especially claudication pain in his legs which is why he started going to the clinic in the first place- per his sister it was the only way he could get through a work day. If unable to prescribe methadone-could consider using  6. Home Hospice referral 7. For this hospitalization treat reversible conditions that require minimal invasive procedures and monitoring. Minimize blood draws.  8. Continue his methadone dosing inpatient and add on diazapam for agitation- if methadone is a barrier to obtaining PCP can convert to equianalgesic dose of morphine-but ideally maintaining his methadone is safer and has been effective.   50 minutes.Greater than 50%  of this time was spent counseling and coordinating care related to the above assessment and plan.  Lane Hacker, DO Palliative Medicine'

## 2013-12-27 NOTE — Evaluation (Signed)
Physical Therapy Evaluation Patient Details Name: Mark [name] MRN: [id number] DOB: 11-19-1950 Today's Date: 12/27/2013 Time: 3235-5732 PT Time Calculation (min): 16 min  PT Assessment / Plan / Recommendation History of Present Illness  Mark [name] is a 63 year old man with a history of combined systolic and diastolic heart failure, hypertension, coronary artery disease, substance abuse on methadone, history of cerebrovascular accident, and non-small cell lung cancer status post a left lower lobectomy in 2014 who presents with a several day history of progressive dyspnea that is worsened with exertion or lying flat and associated with a cough productive of a thick yellow sputum.  Clinical Impression  Patient presents with problems listed below.  Since PT evaluation this am, patient's care has proceeded to Palliative Care for likely comfort care.  PT services have been discontinued by MD.  PT will sign off.    PT Assessment  Patent does not need any further PT services (Other - Patient status changing to comfort care.  PT d/c'ed.)    Follow Up Recommendations  No PT follow up;Supervision/Assistance - 24 hour    Does the patient have the potential to tolerate intense rehabilitation      Barriers to Discharge        Equipment Recommendations  Hospital bed    Recommendations for Other Services     Frequency      Precautions / Restrictions Precautions Precautions: Fall Restrictions Weight Bearing Restrictions: No   Pertinent Vitals/Pain       Mobility  Bed Mobility Overal bed mobility: Needs Assistance Bed Mobility: Supine to Sit;Sit to Supine Supine to sit: Min assist Sit to supine: Min assist General bed mobility comments: Assist to raise trunk to sitting and to bring LE's onto bed to return to supine Transfers Overall transfer level: Needs assistance Equipment used: Rolling walker (2 wheeled) Transfers: Sit to/from Stand Sit to Stand: Min assist General transfer comment:  Verbal cues for hand placement.  Assist to rise to standing and for balance. Ambulation/Gait Ambulation/Gait assistance: Min assist Ambulation Distance (Feet): 4 Feet Assistive device: Rolling walker (2 wheeled) Gait Pattern/deviations: Decreased step length - right;Decreased step length - left;Decreased stride length;Trunk flexed General Gait Details: Patient able to take 3 steps forward and 3 steps backward.  Decreased balance with gait.    Exercises     PT Diagnosis: Difficulty walking;Abnormality of gait;Generalized weakness;Altered mental status  PT Problem List: Decreased strength;Decreased activity tolerance;Decreased balance;Decreased mobility;Decreased cognition;Decreased knowledge of use of DME;Decreased safety awareness;Cardiopulmonary status limiting activity PT Treatment Interventions:       PT Goals(Current goals can be found in the care plan section)    Visit Information  Last PT Received On: 12/27/13 Assistance Needed: +1 History of Present Illness: Mark [name] is a 63 year old man with a history of combined systolic and diastolic heart failure, hypertension, coronary artery disease, substance abuse on methadone, history of cerebrovascular accident, and non-small cell lung cancer status post a left lower lobectomy in 2014 who presents with a several day history of progressive dyspnea that is worsened with exertion or lying flat and associated with a cough productive of a thick yellow sputum.       Prior [place] expects to be discharged to:: Unsure (Palliative Care consult pending) Living Arrangements: Children Available Help at Discharge: Family;Available 24 hours/day (Daughter and son-in-law per patient) Type of Home: Apartment Home Access: Stairs to enter [employer] of Steps: 10 steps Entrance Stairs-Rails: Right;Left;Can reach both Home Layout: One level Home Equipment: Gilford Rile -  2 wheels;Shower seat;Cane - quad Prior  Function Level of Independence: Needs assistance Gait / Transfers Assistance Needed: with cane per pt ADL's / Homemaking Assistance Needed: assist for bathing  Communication Communication: No difficulties Dominant Hand: Right    Cognition  Cognition Arousal/Alertness: Awake/alert Behavior During Therapy: Flat affect Overall Cognitive Status: No family/caregiver present to determine baseline cognitive functioning General Comments: Patient with confusion during session.    Extremity/Trunk Assessment Upper Extremity Assessment Upper Extremity Assessment: Generalized weakness Lower Extremity Assessment Lower Extremity Assessment: Generalized weakness (LLE shorter than RLE)   Balance Balance Standing balance support: Bilateral upper extremity supported Standing balance-Leahy Scale: Fair  End of Session PT - End of Session Equipment Utilized During Treatment: Gait belt;Oxygen Activity Tolerance: Patient limited by fatigue Patient left: in bed;with call bell/phone within reach;with bed alarm set Nurse Communication: Mobility status  GP     Despina Pole 12/27/2013, 3:32 PM Carita Pian. Sanjuana Kava, [place] Pager [phone]

## 2013-12-28 ENCOUNTER — Telehealth: Payer: Self-pay | Admitting: Emergency Medicine

## 2013-12-28 DIAGNOSIS — J189 Pneumonia, unspecified organism: Secondary | ICD-10-CM

## 2013-12-28 DIAGNOSIS — F1110 Opioid abuse, uncomplicated: Secondary | ICD-10-CM

## 2013-12-28 DIAGNOSIS — Z515 Encounter for palliative care: Secondary | ICD-10-CM

## 2013-12-28 MED ORDER — LEVOFLOXACIN 750 MG PO TABS
750.0000 mg | ORAL_TABLET | Freq: Every day | ORAL | Status: DC
  Administered 2013-12-28 – 2013-12-29 (×2): 750 mg via ORAL
  Filled 2013-12-28 (×3): qty 1

## 2013-12-28 MED ORDER — INFLUENZA VAC SPLIT QUAD 0.5 ML IM SUSP
0.5000 mL | INTRAMUSCULAR | Status: AC
  Administered 2013-12-29: 0.5 mL via INTRAMUSCULAR
  Filled 2013-12-28: qty 0.5

## 2013-12-28 MED ORDER — METHADONE HCL 10 MG PO TABS: 40.0000 mg | ORAL_TABLET | Freq: Every day | ORAL | Status: AC

## 2013-12-28 NOTE — Progress Notes (Addendum)
Palliative Medicine Team Progress Note  Mark [name] is hospice eligible based on late effect CVA with chronic aspiration and failure to thrive. 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 He has severe bilateral leg pain which has been chronic for many years in addition to advanced degenerative hip joints. Left hip pain is worse than right, 2 years ago he fell and broke his left hip- evidence by our imaging and has chronic inflammation and fluid in that joint which limits is ability to walk and bear weight.   Home with Hospice tomorrow-hopefully the hospice team can help this gentleman live a higher QOL, prevent re-hospitalization and uphold the goals of comfort and QOL and length of life if possible given severe and advanced chronic disease.   Mark Chou, NP with [facility] has agreed to assume Attending Provider with hospice and she will make a home visit later this week-it has been too difficult to transport him to outpatient appointments and she can also supervise opiate/methadone prescribing. I will write a script for Mark [name] Methadone for his chronic hip pain and bilateral lower extremity claudication pain.   Script for 10 days of methadone placed on chart-to be filled upon discharge. Hospice will monitor medications.    Continue course of oral antibiotics and other chronic disease management medications.  Mark Hacker, DO Palliative Medicine

## 2013-12-28 NOTE — Progress Notes (Signed)
   CARE MANAGEMENT NOTE 12/28/2013  Patient:  MARCQUES, WRIGHTSMAN   Account Number:  [id number]  Date Initiated:  12/28/2013  Documentation initiated by:  Lizabeth Leyden  Subjective/Objective Assessment:   admitted with weakness, aspiration pneumonia, hx of lung CA     Action/Plan:   home hospice   Anticipated DC Date:  12/29/2013   Anticipated DC Plan:  [place]  CM consult      [facility] Choice  HOME HEALTH   Choice offered to / List presented to:  C-4 Adult Children           HH agency  [facility] [place]   Status of service:  In process, will continue to follow Medicare Important Message given?   (If response is "NO", the following Medicare IM given date fields will be blank) Date Medicare IM given:   Date Additional Medicare IM given:    Discharge Disposition:  [place]  Per UR Regulation:    If discussed at Long Length of Stay Meetings, dates discussed:    Comments:  Contact:  Cathlyn Parsons  [phone] [address] [place] [address]  12/27/2013  [place], [place] CM referral: home hospice  Spoke with patient regarding home care, he referred NCM to speak with daughter Baxter Flattery  Call to Baxter Flattery to offer choice for home hospice.  She selected [facility].  NCM will contact Hospice for referral.  [facility] called with referral. Provided contact information for daughter and plan for patient to go to her home.

## 2013-12-28 NOTE — Plan of Care (Signed)
Problem: Phase II Progression Outcomes Goal: Progress activity as tolerated unless otherwise ordered Outcome: Progressing Pt up to chair.

## 2013-12-28 NOTE — Progress Notes (Signed)
Subjective: Mark [name] is doing better this morning, more clear mentally, eating his breakfast though with some coughing/choking.  Asking for methadone.  Dr. Hilma Favors with palliative care is very familiar with Mark [name] and spoke to his sister Baxter Flattery yesterday via phone consult.  Patient is comfort care with minor interventions only, DNR.  Referral to hospice has been placed.  Per Dr. Hilma Favors, patient and his sister would like him to complete course of PO antibiotics for aspiration PNA.  She also added diazepam for agitation and is looking into whether hospice MD can prescribe patient's methadone.  These issues need to be resolved prior to discharge.  Objective: Vital signs in last 24 hours: Filed Vitals:   12/27/13 1200 12/27/13 1637 12/27/13 2017 12/28/13 0454  BP: 124/73 127/90 129/87 124/82  Pulse: 62 87 83 77  Temp: 97.1 F (36.2 C) 97.8 F (36.6 C) 99.4 F (37.4 C) 98.6 F (37 C)  TempSrc: Axillary Axillary Oral Oral  Resp: 17 17 18 16   Height:      Weight:   129 lb 11.2 oz (58.832 kg)   SpO2: 100% 100% 98% 100%   Weight change: 11.7 oz (0.331 kg)  Intake/Output Summary (Last 24 hours) at 12/28/13 0811 Last data filed at 12/28/13 0454  Gross per 24 hour  Intake    480 ml  Output    770 ml  Net   -290 ml   PEX General: alert, cooperative, and in no apparent distress HEENT: NCAT, vision grossly intact, oropharynx clear and non-erythematous  Neck: supple, no lymphadenopathy Lungs: diminished breath sounds especially at right lung base, normal work of respiration, no wheezes, rales, ronchi Heart: regular rate and rhythm, no murmurs, gallops, or rubs Abdomen: soft, non-tender, non-distended, normal bowel sounds Extremities: nonpalpable DP pulses, no cyanosis, clubbing, or edema Neurologic: alert & oriented X3, cranial nerves II-XII intact, strength grossly intact, sensation intact to light touch  Lab Results: Basic Metabolic Panel:  Recent Labs Lab 12/26/13 1231  NA  141  K 4.1  CL 100  CO2 29  GLUCOSE 160*  BUN 16  CREATININE 1.15  CALCIUM 8.4   Liver Function Tests:  Recent Labs Lab 12/26/13 1231  AST 36  ALT 22  ALKPHOS 106  BILITOT 1.0  PROT 7.7  ALBUMIN 2.3*   CBC:  Recent Labs Lab 12/26/13 1231  WBC 6.4  NEUTROABS 4.1  HGB 9.4*  HCT 32.9*  MCV 68.3*  PLT 309   BNP:  Recent Labs Lab 12/26/13 1231  PROBNP 3391.0*   D-Dimer:  Recent Labs Lab 12/26/13 1231  DDIMER >20.00*   Studies/Results: Dg Chest 2 View  12/26/2013   CLINICAL DATA:  Shortness of breath, productive cough and vomiting  EXAM: CHEST  2 VIEW  COMPARISON:  DG CHEST 2 VIEW dated 12/12/2013; CT CHEST W/CM dated 11/21/2013  FINDINGS: Heart size upper normal and stable. There is persistent opacity in the right infrahilar region. There remains a small right pleural effusion. There is persistent infiltrate in the left perihilar area although it is improved moderately when compared to the prior study. There remains a small left pleural effusion as well.  IMPRESSION: Stable right infiltrate and small effusion. Persistent but improved left infiltrate and small effusion. Findings continue to be suspicious for pneumonia, but should be followed to ensure complete resolution.   Electronically Signed   By: Skipper Cliche M.D.   On: 12/26/2013 11:29   Ct Angio Chest Pe W/cm &/or Wo Cm  12/26/2013  CLINICAL DATA:  Elevated D-dimer, rule out pulmonary embolism.  EXAM: CT ANGIOGRAPHY CHEST WITH CONTRAST  TECHNIQUE: Multidetector CT imaging of the chest was performed using the standard protocol during bolus administration of intravenous contrast. Multiplanar CT image reconstructions and MIPs were obtained to evaluate the vascular anatomy.  CONTRAST:  120mL OMNIPAQUE IOHEXOL 350 MG/ML SOLN  COMPARISON:  11/21/2013 and 02/26/2013  FINDINGS: There is mild paraseptal emphysematous disease over the apices and bases. Lungs are adequately inflated and continue demonstrated moderate size  right pleural effusion slightly worse. There is a very small amount of left pleural fluid slightly worse. There is a persistent patchy bilateral airspace process with mild overall worsening most notably over the left lower lobe. This likely represents a worsening infectious process. There is moderate cardiomegaly. Continued evidence of calcified plaque over the left anterior descending and lateral circumflex arteries. Continued evidence of mild adenopathy over the pretracheal region and subcarinal region without significant change likely reactive. Pulmonary arterial system is well opacified. Somewhat equivocal finding of possible very distal filling defect over the right posterior basilar pulmonary artery as favor this to be artifactual in nature. Remainder of the pulmonary arterial system is within normal. Remaining mediastinal structures are unremarkable. There is moderate fluid and debris within the esophagus unchanged likely due to dysmotility versus reflux.  Images through the upper abdomen are unchanged. Remainder the exam is unchanged.  Review of the MIP images confirms the above findings.  IMPRESSION: Persistent bilateral patchy airspace process with overall worsening compared to the prior exam particularly over the left lower lobe likely an ongoing infectious process. Persistent moderate size right pleural effusions slightly worse. Tiny left pleural effusion slightly worse. Mild emphysematous disease.  No definite pulmonary emboli.  Cardiomegaly.  Minimal atherosclerotic coronary artery disease.  Moderate fluid and debris within the esophagus likely due to reflux versus dysmotility.   Electronically Signed   By: Marin Olp M.D.   On: 12/26/2013 15:07   Medications: I have reviewed the patient's current medications. Scheduled Meds: . carvedilol  3.125 mg Oral BID WC  . enoxaparin (LOVENOX) injection  40 mg Subcutaneous Q24H  . furosemide  20 mg Oral Daily  . lisinopril  40 mg Oral Daily  .  methadone  40 mg Oral Daily  . piperacillin-tazobactam (ZOSYN)  IV  3.375 g Intravenous Q8H   Continuous Infusions: . dextrose 5 % and 0.45% NaCl 75 mL/hr at 12/27/13 2146   PRN Meds:.diazepam Assessment/Plan: #Presumed aspiration pneumonia- Treatment for CAP vs aspiration PNA in past with slightly worse effusions (see below) on admission CT chest and worsening of infiltrate in lower left lobe.  No evidence of PE.  Patient has esophageal dysmotility, seen on barium swallow on 11/22/13 which puts him at risk for chronic aspiration.  He also reports coughing every time he swallows (even secretions) at home recently.  Cefepime and vancomycin initiated in ED for HCAP coverage but transitioned to Zosyn for aspiration PNA on admission.  No fever, leucocytosis, no tachycardia, no tachypnea (no SIRS criteria).  Flu panel negative.  Blood cultures x 2 NGTD.  Discontinued SLP eval as patient is comfort care only.   -palliative care consult, appreciate recs; see above for details -convert to levofloxacin PO 750 mg daily  -D5NS at 75 cc/hr -dysphagia 1 diet comfort feeds, nursing to assist with feeds -need to check O2 sats off O2  #Pleural effusion- In the past thought secondary to CHF although patient does not appear to be volume overloaded at this time.  Perhaps 2/2 chronic aspiration.  Right effusion is larger than left, slightly worse on admission CT than previous.  Likely causing some of his exertional dyspnea.   #Esophageal dysmotility- Placed on dysphagia diet during last admission and likely still aspirating despite this diet.  Unclear adherence to dietary recommendations.  -palliative care consult per above -continue dysphagia 1 diet  #Chronic combined systolic and diastolic heart failure- Patient does not appear volume overloaded at this time.  Last echo in 03/2013 showed grade 2 diastolic dysfunction and EF 35-40%.  pBNP stable, not in need of diuresis at this time.  On Coreg 3.125mg  BID,  lisinopril 30 mg daily, Lasix 20mg  daily, hydralazine 10mg  TID, ASA at home.  -continue Coreg, lisinopril, Lasix at home doses -holding hydralazine for now  #HTN- Stable. On hydralazine, lisinopril, lasix and coreg at home.  -continue most home meds per above  #History of substance abuse- Currently taking methadone 50 mg daily at home. -methadone 40 mg ODT daily  #Protein calorie malnutrition- Ensure BID at home though patient states he only uses it once per day. Albumin 2.3 on admission.   -continue Ensure   Dispo:  Anticipated discharge tomorrow 3/3.   The patient does have a current PCP (Angelica Chessman, MD) and does need an [facility] hospital follow-up appointment after discharge.   .Services Needed at time of discharge: Y = Yes, Blank = No PT:   OT:   RN:   Equipment:   Other:     LOS: 2 days   Ivin Poot, MD 12/28/2013, 8:11 AM

## 2013-12-28 NOTE — Progress Notes (Signed)
Notified by Rosette Reveal, patient and family request services of [facility]) after discharge.  Spoke with daughter Cathlyn Parsons, her husband Rob and pt at bedside to initiate education related to hospice services, philosophy and team approach to care; Baxter Flattery voiced familiarity with [facility] services as her grandmother recently passed under the care of [facility]. Baxter Flattery shared her father has been failing for the last 6 weeks and she stated they want to focus on comfort at this time. Per discussion plan is to discharge by PTAR when all arrangements are in place;   dtr voiced need to prepare home for needed equipment- hopeful for discharge home tomorrow, Tuesday. -Pt currently has a condom catheter in place and family would like this to remain in place at d/c. *Please send completed GOLD DNR FORM home with patient  DME needs discussed and requesting Complete Package A: fully electric hospital bed with AP&P mattress, full rails, 3n1 BSC, over-bed table, walker w/wheels, light weight wheel chair, and tub seat with back; pt currently has O2 in the home through [facility] and is currently on 2 LNC continuous. Anne Ng CMRN to notify [employer] representative regarding needed DME  Initial paperwork faxed to [facility]   Completed d/c summary will need to be faxed to [place] @ [phone] when final Please notify [facility] when patient is ready to leave unit at d/c call [phone] (or [phone] if after 5 pm);  [facility] information and contact numbers also given to daughter Baxter Flattery during visit.   Above information shared with [facility] Please call with any questions or concerns   Danton Sewer, RN 12/28/2013, 1:20 PM [facility] [phone]

## 2013-12-28 NOTE — Plan of Care (Signed)
Problem: Phase II Progression Outcomes Goal: Tolerating diet Outcome: Progressing Poor po intake

## 2013-12-28 NOTE — Progress Notes (Signed)
Nutrition Brief Note  Chart reviewed. Pt now transitioning to comfort care.  No further nutrition interventions warranted at this time.  Please re-consult as needed.   Inda Coke MS, RD, LDN Inpatient Registered Dietitian Pager: [phone] After-hours pager: [phone]

## 2013-12-28 NOTE — Progress Notes (Signed)
Internal Medicine Attending  Date: 12/28/2013  Patient name: Mark [name] Medical record number: [id number] Date of birth: 1950/12/08 Age: 63 y.o. Gender: male  I saw and evaluated the patient, and discussed his care on A.M rounds with housestaff.  I reviewed the resident's note by Dr. Stann Mainland and I agree with the resident's findings and plans as documented in her note, with the following additional comments.  Palliative care consult saw patient 3/1 and per their discussion of goals of care, plan is to continue active treatments including antibiotics with focus on patient comfort, and home hospice referral is planned.

## 2013-12-28 NOTE — Plan of Care (Signed)
Problem: Phase I Progression Outcomes Goal: OOB as tolerated unless otherwise ordered Outcome: Progressing Pt up to chair pt to go home with hospice

## 2013-12-28 NOTE — Telephone Encounter (Signed)
The nurse, from hospice and palliative care called in requesting orders for pt that will be admitted to facility tomorrow post hospital. Pt was seen by Dr. Sherral Hammers 12/10/13 to establish care. Dewaine Oats, the nurse states hospice doctors will write symptomatic orders, but is questioning will you be the attending to write admission orders? Please f/u

## 2013-12-29 ENCOUNTER — Other Ambulatory Visit: Payer: Self-pay | Admitting: Medical Oncology

## 2013-12-29 DIAGNOSIS — C3490 Malignant neoplasm of unspecified part of unspecified bronchus or lung: Secondary | ICD-10-CM

## 2013-12-29 DIAGNOSIS — J690 Pneumonitis due to inhalation of food and vomit: Principal | ICD-10-CM

## 2013-12-29 DIAGNOSIS — I10 Essential (primary) hypertension: Secondary | ICD-10-CM

## 2013-12-29 DIAGNOSIS — E43 Unspecified severe protein-calorie malnutrition: Secondary | ICD-10-CM

## 2013-12-29 MED ORDER — AMOXICILLIN-POT CLAVULANATE 875-125 MG PO TABS: 1.0000 | ORAL_TABLET | Freq: Two times a day (BID) | ORAL | Status: AC

## 2013-12-29 NOTE — Discharge Instructions (Signed)
Please complete your course of antibiotics (Augmentin).  Hospice will follow-up with you in the next day or so.  Your new PCP [name] will visit you at your home later this week.  We are giving you a prescription for methadone this week until NP Aris Lot starts prescribing it.

## 2013-12-29 NOTE — Progress Notes (Signed)
Subjective: [name] is doing better this morning, ready to go home.    Objective: Vital signs in last 24 hours: Filed Vitals:   12/28/13 1400 12/28/13 1800 12/28/13 2028 12/29/13 0505  BP: 124/76 130/85 126/83 137/88  Pulse: 86 82 81 79  Temp: 98.4 F (36.9 C) 98 F (36.7 C) 98.9 F (37.2 C) 97.6 F (36.4 C)  TempSrc: Oral Oral Oral Oral  Resp: 18 18 16 17   Height:      Weight:   133 lb (60.328 kg)   SpO2: 100% 99% 100% 100%   Weight change: 3 lb 4.8 oz (1.497 kg)  Intake/Output Summary (Last 24 hours) at 12/29/13 0727 Last data filed at 12/29/13 9798  Gross per 24 hour  Intake    820 ml  Output    650 ml  Net    170 ml   PEX General: alert, cooperative, and in no apparent distress HEENT: NCAT, vision grossly intact, oropharynx clear and non-erythematous  Neck: supple, no lymphadenopathy Lungs: diminished breath sounds especially at right lung base, normal work of respiration, no wheezes, rales, ronchi Heart: regular rate and rhythm, no murmurs, gallops, or rubs Abdomen: soft, non-tender, non-distended, normal bowel sounds Extremities: nonpalpable DP pulses, no cyanosis, clubbing, or edema Neurologic: alert & oriented X3, cranial nerves II-XII intact, strength grossly intact, sensation intact to light touch  Lab Results: Basic Metabolic Panel:  Recent Labs Lab 12/26/13 1231  NA 141  K 4.1  CL 100  CO2 29  GLUCOSE 160*  BUN 16  CREATININE 1.15  CALCIUM 8.4   Liver Function Tests:  Recent Labs Lab 12/26/13 1231  AST 36  ALT 22  ALKPHOS 106  BILITOT 1.0  PROT 7.7  ALBUMIN 2.3*   CBC:  Recent Labs Lab 12/26/13 1231  WBC 6.4  NEUTROABS 4.1  HGB 9.4*  HCT 32.9*  MCV 68.3*  PLT 309   BNP:  Recent Labs Lab 12/26/13 1231  PROBNP 3391.0*   D-Dimer:  Recent Labs Lab 12/26/13 1231  DDIMER >20.00*   Medications: I have reviewed the patient's current medications. Scheduled Meds: . carvedilol  3.125 mg Oral BID WC  . enoxaparin  (LOVENOX) injection  40 mg Subcutaneous Q24H  . furosemide  20 mg Oral Daily  . influenza vac split quadrivalent PF  0.5 mL Intramuscular Tomorrow-1000  . levofloxacin  750 mg Oral Daily  . lisinopril  40 mg Oral Daily  . methadone  40 mg Oral Daily   Continuous Infusions: . dextrose 5 % and 0.45% NaCl 75 mL/hr at 12/28/13 1847   PRN Meds:.diazepam Assessment/Plan: #Presumed aspiration pneumonia- Treatment for CAP vs aspiration PNA in past with slightly worse effusions (see below) on admission CT chest and worsening of infiltrate in lower left lobe; no evidence of PE.  Patient has esophageal dysmotility, seen on barium swallow on 11/22/13 which puts him at risk for chronic aspiration.  He also reports coughing every time he swallows (even secretions) at home recently.  No fever, leucocytosis, no tachycardia, no tachypnea (no SIRS criteria).  Cefepime and vancomycin initiated in ED for HCAP coverage but transitioned to Zosyn for aspiration PNA on admission; converted to PO antibiotics in anticipation of discharge, received two doses of levofloxacin on 3/2-3.  Flu panel negative.  Blood cultures x 2 NGTD.  Patient uses home O2. Dr. 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  [facility].  Patient will continue taking medications for his chronic medical problems and complete antibiotic course for his current aspiration pneumonia though with a focus on comfort per his goals of care.  Dr. Hilma Favors has also arranged for NP [name] to take over as patient's PCP; Ms. Aris Lot will make home visit later this week and will be prescribing methadone for patient's chronic pain going forward (see below).  These interventions will hopefully improved patient's quality of life and help prevent hospital re-admissions.   -palliative care consult, appreciate recs -convert to amoxicillin at discharge x 6 additional days (10 days total antibiotic therapy) as he has tolerated this well in past -continue D5NS at 75 cc/hr through discharge -dysphagia 1 diet comfort feeds, nursing to assist with feeds  #Pleural effusion- In the past thought secondary to CHF although patient does not appear to be volume overloaded at this time.  Perhaps 2/2 chronic aspiration.  Right effusion is larger than left, slightly worse on admission CT than previous.  Likely causing some of his exertional dyspnea.   #Esophageal dysmotility- Placed on dysphagia diet during last admission and likely still aspirating despite this diet.  Unclear adherence to dietary recommendations at home.  -palliative care consult per above -continue dysphagia 1 diet  #Chronic combined systolic and diastolic heart failure- Patient does not appear volume overloaded at this time.  Last echo in 03/2013 showed grade 2 diastolic dysfunction and EF 35-40%.  pBNP stable, not in need of diuresis at this time.  On Coreg 3.125mg  BID, lisinopril 30 mg daily, Lasix 20mg  daily, hydralazine 10mg  TID, ASA at home.  -continue Coreg, lisinopril, Lasix at home doses -holding hydralazine, will discontinue at discharge  #HTN- Stable. On hydralazine, lisinopril, lasix and coreg at home.  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  NP [name] is taking over at patient's PCP and will prescribe methadone for chronic pain going forward.   -continue methadone 40 mg daily -Dr. Hilma Favors has put a 10 day rx for methadone on patient's chart  to tie over until he has established with Ms. Aris Lot; this will be provided at discharge, and hospice with monitor medications.  #Protein calorie malnutrition- Ensure BID at home though patient states he only uses it once per day. Albumin 2.3 on admission.   -continue Ensure   Dispo:  Anticipated discharge home today.   The patient does have a current PCP (Angelica Chessman, MD) and does need an [facility] hospital follow-up appointment after discharge.  Patient now under care of NP [name], and she has agreed to make home visit later this week.    .Services Needed at time of discharge: Y = Yes, Blank = No PT:   OT:   RN:   Equipment:   Other:     LOS: 3 days   Ivin Poot, MD 12/29/2013, 7:27 AM

## 2013-12-29 NOTE — Discharge Summary (Signed)
Name: Mark [name] MRN: [id number] DOB: 1951-02-15 63 y.o. PCP: Angelica Chessman, MD  --> [name], NP  Date of Admission: 12/26/2013 10:27 AM Date of Discharge: 12/29/2013 Attending Physician: Axel Filler, MD  Discharge Diagnosis: Principal Problem:   Aspiration pneumonia Active Problems:   Essential hypertension, benign   Chronic combined systolic and diastolic congestive heart failure   Pleural effusion   Esophageal dysmotility   Protein-calorie malnutrition, severe   DNR (do not resuscitate)   Palliative care patient  Discharge Medications:   Medication List    STOP taking these medications       hydrALAZINE 10 MG tablet  Commonly known as:  APRESOLINE      TAKE these medications       amoxicillin-clavulanate 875-125 MG per tablet  Commonly known as:  AUGMENTIN  Take 1 tablet by mouth 2 (two) times daily.     aspirin 81 MG EC tablet  Take 1 tablet (81 mg total) by mouth daily.     carvedilol 3.125 MG tablet  Commonly known as:  COREG  Take 1 tablet (3.125 mg total) by mouth 2 (two) times daily with a meal.     cholecalciferol 1000 UNITS tablet  Commonly known as:  VITAMIN D  Take 1,000 Units by mouth daily.     docusate 50 MG/5ML liquid  Commonly known as:  COLACE  Take 5 mLs (50 mg total) by mouth daily.     feeding supplement (ENSURE COMPLETE) Liqd  Take 237 mLs by mouth 2 (two) times daily between meals.     feeding supplement (ENSURE) Pudg  Take 1 Container by mouth 2 (two) times daily after a meal.     folic acid 170 MCG tablet  Commonly known as:  FOLVITE  Take 400 mcg by mouth daily.     furosemide 20 MG tablet  Commonly known as:  LASIX  Take 1 tablet (20 mg total) by mouth daily.     lisinopril 30 MG tablet  Commonly known as:  PRINIVIL,ZESTRIL  Take 30 mg by mouth daily.     methadone 10 MG tablet  Commonly known as:  DOLOPHINE  Take 4 tablets (40 mg total) by mouth daily.     multivitamin with minerals Tabs tablet  Take  1 tablet by mouth daily.     nystatin 100000 UNIT/ML suspension  Commonly known as:  MYCOSTATIN  Take 5 mLs (500,000 Units total) by mouth 4 (four) times daily.     polyethylene glycol packet  Commonly known as:  MIRALAX / GLYCOLAX  Take 17 g by mouth daily as needed for moderate constipation.     potassium chloride SA 20 MEQ tablet  Commonly known as:  K-DUR,KLOR-CON  Take 1 tablet (20 mEq total) by mouth daily.        Disposition and follow-up:   Mr.Mark [name] was discharged from [facility] in Stable condition.  At the hospital follow up visit please address: 1.  Completion of oral antibiotic course?  2.  Status of swallowing (associated with coughing/choking)  3.  Labs / imaging needed at time of follow-up: none  4.  Pending labs/ test needing follow-up: none  Follow-up Appointments: Follow-up Information   [facility] with [facility] ([facility] to follow aftr d/c; pls notify when transport on unti & pt ready to leave -call [phone] or [phone] aftr 5 pm)    Contact information:   [facility] [place] [phone]  Discharge Instructions: Discharge Orders   Future Appointments Provider Department Dept Phone   03/09/2014 12:00 PM Lorayne Marek, MD [place] [phone]   Future Orders Complete By Expires   Call MD for:  difficulty breathing, headache or visual disturbances  As directed    Diet - low sodium heart healthy  As directed    Increase activity slowly  As directed       Consultations: Treatment Team:  Palliative [facility]  Procedures Performed:  Dg Chest 2 View  12/26/2013   CLINICAL DATA:  Shortness of breath, productive cough and vomiting  EXAM: CHEST  2 VIEW  COMPARISON:  DG CHEST 2 VIEW dated 12/12/2013; CT CHEST W/CM dated 11/21/2013  FINDINGS: Heart size upper normal and stable. There is persistent opacity in the right infrahilar region. There  remains a small right pleural effusion. There is persistent infiltrate in the left perihilar area although it is improved moderately when compared to the prior study. There remains a small left pleural effusion as well.  IMPRESSION: Stable right infiltrate and small effusion. Persistent but improved left infiltrate and small effusion. Findings continue to be suspicious for pneumonia, but should be followed to ensure complete resolution.   Electronically Signed   By: Skipper Cliche M.D.   On: 12/26/2013 11:29   Dg Chest 2 View  12/12/2013   CLINICAL DATA:  Fatigue.  EXAM: CHEST  2 VIEW  COMPARISON:  November 23, 2013.  FINDINGS: Mild cardiomegaly is noted. Left pleural effusion is noted which is improved compared to prior exam. However, mild right pleural effusion is noted which is significantly increased compared to prior exam. Left perihilar and right infrahilar opacities are noted concerning for edema or possibly pneumonia. No pneumothorax is noted. Bony thorax is intact.  IMPRESSION: Left perihilar and right infrahilar opacities are noted concerning for edema or possibly pneumonia. Increased right pleural effusion is noted, with improved left pleural effusion.   Electronically Signed   By: Sabino Dick M.D.   On: 12/12/2013 15:14   Ct Angio Chest Pe W/cm &/or Wo Cm  12/26/2013   CLINICAL DATA:  Elevated D-dimer, rule out pulmonary embolism.  EXAM: CT ANGIOGRAPHY CHEST WITH CONTRAST  TECHNIQUE: Multidetector CT imaging of the chest was performed using the standard protocol during bolus administration of intravenous contrast. Multiplanar CT image reconstructions and MIPs were obtained to evaluate the vascular anatomy.  CONTRAST:  125mL OMNIPAQUE IOHEXOL 350 MG/ML SOLN  COMPARISON:  11/21/2013 and 02/26/2013  FINDINGS: There is mild paraseptal emphysematous disease over the apices and bases. Lungs are adequately inflated and continue demonstrated moderate size right pleural effusion slightly worse. There is a  very small amount of left pleural fluid slightly worse. There is a persistent patchy bilateral airspace process with mild overall worsening most notably over the left lower lobe. This likely represents a worsening infectious process. There is moderate cardiomegaly. Continued evidence of calcified plaque over the left anterior descending and lateral circumflex arteries. Continued evidence of mild adenopathy over the pretracheal region and subcarinal region without significant change likely reactive. Pulmonary arterial system is well opacified. Somewhat equivocal finding of possible very distal filling defect over the right posterior basilar pulmonary artery as favor this to be artifactual in nature. Remainder of the pulmonary arterial system is within normal. Remaining mediastinal structures are unremarkable. There is moderate fluid and debris within the esophagus unchanged likely due to dysmotility versus reflux.  Images through the upper abdomen are unchanged. Remainder the exam is  unchanged.  Review of the MIP images confirms the above findings.  IMPRESSION: Persistent bilateral patchy airspace process with overall worsening compared to the prior exam particularly over the left lower lobe likely an ongoing infectious process. Persistent moderate size right pleural effusions slightly worse. Tiny left pleural effusion slightly worse. Mild emphysematous disease.  No definite pulmonary emboli.  Cardiomegaly.  Minimal atherosclerotic coronary artery disease.  Moderate fluid and debris within the esophagus likely due to reflux versus dysmotility.   Electronically Signed   By: Marin Olp M.D.   On: 12/26/2013 15:07    Admission HPI:  Mr. 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  Patient has some  SOB at baseline, but notes that his SOB has worsened recently especially upon exertion and lying flat. He also endorses having a cough productive of thick, yellow sputum. He thinks that the cough mostly occurs when he is swallowing, whether it is solid or liquid or even his own saliva. Denies F/C, CP, abd pain, BLE edema, hemoptysis. He wears oxygen at night at home at 2.5 LPM. He notes that he has intermittent constipation and his last normal BM was Wednesday of this week.  Patient is a somewhat unclear historian. No family was present during my interview. Per chart review, he was recently admitted and treated for community acquired vs aspiration pneumonia (1/24-1/31/15) and found to have esophageal dysmotility on MBS. He was discharged to SNF. He was then re-admitted (2/14-2/17) for volume overload thought to be 2/2 CHF exacerbation at which time coreg and lasix 20mg  daily were added to his regimen. He was discharged home w/ [facility] PT, RN.  He is living with his daughter. He spends most of his time in bed when he is at home. [place] services of some kind have been coming by to assist him. According to day team who spoke with [place], patient's family is now becoming interested in pursuing palliative care.    Hospital Course by problem list: 1. Presumed aspiration pneumonia- Patient has had multiple recent admissions with treatment for CAP vs aspiration PNA.  CT chest on admission showed worsening of left lower lobe infiltrate, slightly worse effusions (see below); no evidence of PE.  Patient has esophageal dysmotility, seen on barium swallow on 11/22/13 which puts him at risk for chronic aspiration. He also reports coughing every time he swallows (even secretions) at home recently.  No fever, leucocytosis, no tachycardia, no tachypnea (no SIRS criteria). Cefepime and vancomycin initiated in ED for HCAP coverage but transitioned to Zosyn for aspiration PNA on admission; converted to PO antibiotics on 3/2 in anticipation  of discharge, patient received levofloxacin on 3/2-3 then discharged on amoxicillin x 6 additional days (10 days total antibiotic therapy) as he has tolerated this well in past.  Flu panel negative.  Blood cultures x 2 NGTD.  Dr. Hilma Favors of palliative care has spoken with patient and his sister Baxter Flattery [facility]) at length, and all are in agreement patient is appropriate for home hospice, DNR given his late effect CVA with chronic aspiration and failure to thrive. They have [facility] [place]. Patient will continue taking medications for his chronic medical problems and complete antibiotic course for his current aspiration pneumonia though with a focus on comfort per  his goals of care. Dr. Hilma Favors has also arranged for NP [name] to take over as patient's PCP; Ms. Aris Lot will make home visit later this week and will be prescribing methadone for patient's chronic pain going forward (see below). These interventions will hopefully improve patient's quality of life and help prevent hospital re-admissions.  Patient was also instructed to adhere to dysphagia 1 diet, hospice will assist with this.   2. Pleural effusion- In the past thought secondary to CHF although patient does not appear to be volume overloaded at this time; perhaps due to chronic aspiration.  Right effusion larger than left, slightly worse on admission CT than previous. Likely causing some of his exertional dyspnea.   3. Esophageal dysmotility- Placed on dysphagia 1 diet during last admission and likely still aspirating despite this diet.  Unclear adherence to dietary recommendations at home.  Patient was instructed to adhere to dysphagia 1 diet at discharge, hospice will assist with this.   4. Chronic combined systolic and diastolic heart failure- Patient did not appear volume overloaded this admission.  Last echo in 03/2013 showed grade 2 diastolic dysfunction and EF 35-40%.  pBNP stable, not in need of additional  diuresis at this time. On Coreg 3.125mg  BID, lisinopril 30 mg daily, Lasix 20mg  daily, hydralazine 10mg  TID, ASA at home.  Continued Coreg, lisinopril, Lasix, ASA at home doses while inpatient and at discharge given mortality benefits and per goals of care.  Held hydralazine while inpatient and at discharge.   5. HTN- Stable. Now on Coreg, lisinopril, Lasix per above.   6. 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 NP [name] is taking over as patient's PCP and will prescribe methadone for chronic pain going forward. Dr. Hilma Favors placed a 10 day prescription for methadone on patient's chart to tie him over until he has established with Ms. Aris Lot; this was provided at discharge, and hospice will help monitor medications.   7. Protein calorie malnutrition- Patient prescribed Ensure BID though states he only uses it daily. Albumin 2.3 on admission.  Continue Ensure BID, home hospice will assist with this.    Discharge Vitals:   BP 124/91  Pulse 83  Temp(Src) 97.5 F (36.4 C) (Oral)  Resp 18  Ht 5\' 11"  (1.803 m)  Wt 133 lb (60.328 kg)  BMI 18.56 kg/m2  SpO2 100%  Discharge Labs:  No results found for this or any previous visit (from the past 24 hour(s)).  Signed: Ivin Poot, MD 12/29/2013, 11:39 AM   Time Spent on Discharge: 35 minutes Services Ordered on Discharge: home hospice Equipment Ordered on Discharge: home O2

## 2013-12-29 NOTE — Progress Notes (Signed)
   CARE MANAGEMENT NOTE 12/29/2013  Patient:  Mark [name], Mark [name]   Account Number:  [id number]  Date Initiated:  12/28/2013  Documentation initiated by:  Lizabeth Leyden  Subjective/Objective Assessment:   admitted with weakness, aspiration pneumonia, hx of lung CA     Action/Plan:   home hospice   Anticipated DC Date:  12/29/2013   Anticipated DC Plan:  [place]  CM consult      [facility] Choice  HOME HEALTH   Choice offered to / List presented to:  C-4 Adult Children           HH agency  [facility]   Status of service:  Completed, signed off Medicare Important Message given?   (If response is "NO", the following Medicare IM given date fields will be blank) Date Medicare IM given:   Date Additional Medicare IM given:    Discharge Disposition:  [place]  Per UR Regulation:    If discussed at Long Length of Stay Meetings, dates discussed:    Comments:  Contact:  Mark [name]  [phone] [address] [place] [address]  12/29/2013  [place], [place] call to Mark [name] to advise of DME delivery today, [facility] will call her to set a time. She noted the patient will need transport to home.   SW advised.  12/29/2013  [place], [place] [phone] [facility]/Mark [name] called regarding the delivery for the DME, plan for today  [facility] called to update regarding the delivery of the equipment for today.  12/27/2013  [place], [place] CM referral: home hospice  Spoke with patient regarding home care, he referred NCM to speak with daughter Mark [name]  Call to Mark [name] to offer choice for home hospice.  She selected [facility].  NCM will contact Hosp[facility] for referral.  [facility] called with referral. Provided contact information for daughter and plan for patient to go to her home.  [facility]/Mark [name] called lm  regarding referral

## 2013-12-29 NOTE — Progress Notes (Signed)
Patient discharged to home. Patient AVS reviewed with patient's daughter over the phone. A copy of the patient's AVS and prescription was given to EMS staff to give to patient's daughter. Patient remains stable; no signs or symptoms of distress. Patient transported via EMS with belongings at his side.  [facility] was notified when patient was picked up by EMS.

## 2013-12-29 NOTE — Progress Notes (Signed)
Patient ID: Mark [name], male   DOB: 05-11-1951, 63 y.o.   MRN: [id number] Internal Medicine Attending  Date: 12/29/2013  Patient name: Mark [name] Medical record number: [id number] Date of birth: [birth year] Age: 63 y.o. Gender: male  I saw and evaluated the patient on AM rounds, and discussed his care with resident Dr. Stann Mainland; I agree with plan to discharge patient home today under home hospice care.

## 2013-12-29 NOTE — Clinical Social Work Note (Signed)
Patient medically stable for discharge home today. Ambulance transport requested and facilitated by CSW.  Jael Waldorf Givens, MSW, LCSW [phone]

## 2013-12-30 ENCOUNTER — Telehealth: Payer: Self-pay | Admitting: Internal Medicine

## 2013-12-30 NOTE — Telephone Encounter (Signed)
phone not in service....mailed pt appt sched /avs and letter

## 2014-01-02 LAB — CULTURE, BLOOD (ROUTINE X 2)
CULTURE: NO GROWTH
Culture: NO GROWTH

## 2014-01-04 ENCOUNTER — Telehealth: Payer: Self-pay

## 2014-01-04 NOTE — Telephone Encounter (Signed)
Who is the person who called?  i need a name and call back number

## 2014-01-05 LAB — AFB CULTURE WITH SMEAR (NOT AT ARMC): ACID FAST SMEAR: NONE SEEN

## 2014-01-08 ENCOUNTER — Emergency Department (HOSPITAL_COMMUNITY)
Admission: EM | Admit: 2014-01-08 | Discharge: 2014-01-09 | Disposition: A | Discharge status: Home / Self Care | Payer: Medicaid Other | Attending: Emergency Medicine | Admitting: Emergency Medicine

## 2014-01-08 ENCOUNTER — Encounter (HOSPITAL_COMMUNITY): Payer: Self-pay | Admitting: Emergency Medicine

## 2014-01-08 DIAGNOSIS — Z9981 Dependence on supplemental oxygen: Secondary | ICD-10-CM | POA: Insufficient documentation

## 2014-01-08 DIAGNOSIS — Z87891 Personal history of nicotine dependence: Secondary | ICD-10-CM | POA: Insufficient documentation

## 2014-01-08 DIAGNOSIS — F911 Conduct disorder, childhood-onset type: Secondary | ICD-10-CM | POA: Insufficient documentation

## 2014-01-08 DIAGNOSIS — I2510 Atherosclerotic heart disease of native coronary artery without angina pectoris: Secondary | ICD-10-CM | POA: Insufficient documentation

## 2014-01-08 DIAGNOSIS — R4182 Altered mental status, unspecified: Secondary | ICD-10-CM

## 2014-01-08 DIAGNOSIS — Z8701 Personal history of pneumonia (recurrent): Secondary | ICD-10-CM | POA: Insufficient documentation

## 2014-01-08 DIAGNOSIS — F0390 Unspecified dementia without behavioral disturbance: Secondary | ICD-10-CM | POA: Insufficient documentation

## 2014-01-08 DIAGNOSIS — Z85118 Personal history of other malignant neoplasm of bronchus and lung: Secondary | ICD-10-CM | POA: Insufficient documentation

## 2014-01-08 DIAGNOSIS — Z8659 Personal history of other mental and behavioral disorders: Secondary | ICD-10-CM

## 2014-01-08 DIAGNOSIS — Z8669 Personal history of other diseases of the nervous system and sense organs: Secondary | ICD-10-CM | POA: Insufficient documentation

## 2014-01-08 DIAGNOSIS — Z8611 Personal history of tuberculosis: Secondary | ICD-10-CM | POA: Insufficient documentation

## 2014-01-08 DIAGNOSIS — Z8719 Personal history of other diseases of the digestive system: Secondary | ICD-10-CM | POA: Insufficient documentation

## 2014-01-08 DIAGNOSIS — Z79899 Other long term (current) drug therapy: Secondary | ICD-10-CM | POA: Insufficient documentation

## 2014-01-08 DIAGNOSIS — Z8673 Personal history of transient ischemic attack (TIA), and cerebral infarction without residual deficits: Secondary | ICD-10-CM | POA: Insufficient documentation

## 2014-01-08 DIAGNOSIS — Z7982 Long term (current) use of aspirin: Secondary | ICD-10-CM | POA: Insufficient documentation

## 2014-01-08 DIAGNOSIS — I10 Essential (primary) hypertension: Secondary | ICD-10-CM | POA: Insufficient documentation

## 2014-01-08 NOTE — ED Provider Notes (Signed)
CSN: [id number]     Arrival date & time 01/08/14  2336 History   First MD Initiated Contact with Patient 01/08/14 2338     Chief Complaint  Patient presents with  . Altered Mental Status  . Aggressive Behavior     (Consider location/radiation/quality/duration/timing/severity/associated sxs/prior Treatment) HPI Patient is a hospice patient living at home with family. He has a history of dementia, lung cancer and recent pneumonia. Family states that over the last week the patient has become increasingly agitated and difficult to care for at home. According to EMS they called the hospice nurse and were told to have patient evaluated in emergency department. The patient is unable to contribute to his history due to ongoing dementia. He's been given #3 of Ativan and oxycodone prior to coming to the emergency department. Per EMS the patient has been swinging at family and pulling his Foley catheter out. Past Medical History  Diagnosis Date  . Essential hypertension, benign 02/26/2013  . Liver lesion     a. Previously biopsy-proven to be a cyst (per records from a hospital in [place]).  . Hypoxia     a. Adm 02/2013, required home O2.  . Drug abuse   . Depression   . Seizure disorder     "after hip OR 02/2012 once I got home" (04/01/2013)  . GI bleed     a. 2009 at time of stroke - received 3 u blood, no source found per pt  . HTN (hypertension)   . On home oxygen therapy     2.5L "usually use it at night" (04/01/2013)  . Tuberculosis   . Positive TB test   . History of blood transfusion 2009    "don't know what it was related to" (04/01/2013)  . Stroke 2009    a. Pt reports several, last in 2009. b. Residual L sided weakness.  . Anxiety   . Adenocarcinoma, lung     a. Dx 2013, underwent left lower lobectomy under the care of Dr.Lapunzina in [place] for a stage I non-small cell lung CA.; "went to [facility] ~ 06/2012 and they couldn't find anything" (04/01/2013)  . Pneumonia   . Coronary  atherosclerosis of native coronary artery    Past Surgical History  Procedure Laterality Date  . Hip arthroplasty Left 02/2012  . Lung biopsy Left 07/15/2012    "LLL" (04/01/2013)  . Lung removal, partial Left     /notes 02/26/2013  (04/01/2013)   Family History  Problem Relation Age of Onset  . Stroke    . CAD Neg Hx   . Hypertension Mother   . Hypertension Daughter    History  Substance Use Topics  . Smoking status: Former Smoker -- 0.50 packs/day for 42 years    Types: Cigarettes    Quit date: 02/27/2012  . Smokeless tobacco: Never Used  . Alcohol Use: 29.4 oz/week    14 Cans of beer, 35 Shots of liquor per week     Comment: 04/01/2013 "~ 1/2 pint /day of rum plus 2 16oz beers/day"    Review of Systems  Unable to perform ROS: Dementia      Allergies  Review of patient's allergies indicates no known allergies.  Home Medications   Current Outpatient Rx  Name  Route  Sig  Dispense  Refill  . amoxicillin-clavulanate (AUGMENTIN) 875-125 MG per tablet   Oral   Take 1 tablet by mouth 2 (two) times daily.   12 tablet   0   .  aspirin EC 81 MG EC tablet   Oral   Take 1 tablet (81 mg total) by mouth daily.         . carvedilol (COREG) 3.125 MG tablet   Oral   Take 1 tablet (3.125 mg total) by mouth 2 (two) times daily with a meal.   60 tablet   0   . cholecalciferol (VITAMIN D) 1000 UNITS tablet   Oral   Take 1,000 Units by mouth daily.         Marland Kitchen docusate (COLACE) 50 MG/5ML liquid   Oral   Take 5 mLs (50 mg total) by mouth daily.   100 mL   0   . feeding supplement, ENSURE COMPLETE, (ENSURE COMPLETE) LIQD   Oral   Take 237 mLs by mouth 2 (two) times daily between meals.   60 Bottle   0   . feeding supplement, ENSURE, (ENSURE) PUDG   Oral   Take 1 Container by mouth 2 (two) times daily after a meal.   60 Container   0   . folic acid (FOLVITE) 448 MCG tablet   Oral   Take 400 mcg by mouth daily.         . furosemide (LASIX) 20 MG tablet   Oral    Take 1 tablet (20 mg total) by mouth daily.   10 tablet   0   . lisinopril (PRINIVIL,ZESTRIL) 30 MG tablet   Oral   Take 30 mg by mouth daily.         . methadone (DOLOPHINE) 10 MG tablet   Oral   Take 4 tablets (40 mg total) by mouth daily.   40 tablet   0     10 Day Supply- For Pain, Hospice Patient   . Multiple Vitamin (MULTIVITAMIN WITH MINERALS) TABS tablet   Oral   Take 1 tablet by mouth daily.         Marland Kitchen nystatin (MYCOSTATIN) 100000 UNIT/ML suspension   Oral   Take 5 mLs (500,000 Units total) by mouth 4 (four) times daily.   60 mL   0   . polyethylene glycol (MIRALAX / GLYCOLAX) packet   Oral   Take 17 g by mouth daily as needed for moderate constipation.         . potassium chloride SA (K-DUR,KLOR-CON) 20 MEQ tablet   Oral   Take 1 tablet (20 mEq total) by mouth daily.   10 tablet   0    There were no vitals taken for this visit. Physical Exam  Nursing note and vitals reviewed. Constitutional: He appears well-developed and well-nourished. No distress.  Wasted-appearing  HENT:  Head: Normocephalic and atraumatic.  Mouth/Throat: Oropharynx is clear and moist.  Eyes: EOM are normal. Pupils are equal, round, and reactive to light.  Neck: Normal range of motion. Neck supple.  Cardiovascular: Normal rate and regular rhythm.   Pulmonary/Chest: Effort normal and breath sounds normal. No respiratory distress. He has no wheezes. He has no rales. He exhibits no tenderness.  Abdominal: Soft. Bowel sounds are normal. He exhibits no distension and no mass. There is no tenderness. There is no rebound and no guarding.  Musculoskeletal: Normal range of motion. He exhibits no edema and no tenderness.  Neurological: He is alert.  Patient is following commands. He is not oriented to place or time. He moves all extremities without focal deficit.  Skin: Skin is warm and dry. No rash noted. No erythema.  Psychiatric:  Patient is calm  ED Course  Procedures  (including critical care time) Labs Review Labs Reviewed  CBC WITH DIFFERENTIAL  COMPREHENSIVE METABOLIC PANEL  URINALYSIS, ROUTINE W REFLEX MICROSCOPIC  URINE RAPID DRUG SCREEN (HOSP PERFORMED)  ETHANOL   Imaging Review No results found.   EKG Interpretation None      MDM   Final diagnoses:  None      Discussed with the patient's daughter, Cathlyn Parsons  [phone]). She states she is she and her husband are unable to care for the patient at home. She is requesting assistance or placement. We'll ask social work to evaluate and help disposition the morning. Patient remains calm in the emergency department. Patient signed out to oncoming emergency physician  Julianne Rice, MD 01/10/14 [phone]

## 2014-01-08 NOTE — ED Notes (Signed)
Per EMS pt comes from home; Pt placed on hospice for [facility] w/ hx of Lung Cancer; Pt has been increasing combative at home; pulled foley cath out; pt removing o2 cord and tried to eat cord. Pt immobile with speech impairment; pt is only alert to self at baseline;

## 2014-01-09 LAB — URINALYSIS, ROUTINE W REFLEX MICROSCOPIC
BILIRUBIN URINE: NEGATIVE
Glucose, UA: NEGATIVE mg/dL
Ketones, ur: NEGATIVE mg/dL
Leukocytes, UA: NEGATIVE
NITRITE: NEGATIVE
PROTEIN: NEGATIVE mg/dL
Specific Gravity, Urine: 1.015 (ref 1.005–1.030)
UROBILINOGEN UA: 1 mg/dL (ref 0.0–1.0)
pH: 7 (ref 5.0–8.0)

## 2014-01-09 LAB — URINE MICROSCOPIC-ADD ON

## 2014-01-09 LAB — CBC WITH DIFFERENTIAL/PLATELET
BASOS PCT: 0 % (ref 0–1)
Basophils Absolute: 0 10*3/uL (ref 0.0–0.1)
Eosinophils Absolute: 0.1 10*3/uL (ref 0.0–0.7)
Eosinophils Relative: 1 % (ref 0–5)
HEMATOCRIT: 33.8 % — AB (ref 39.0–52.0)
Hemoglobin: 9.9 g/dL — ABNORMAL LOW (ref 13.0–17.0)
LYMPHS ABS: 2.3 10*3/uL (ref 0.7–4.0)
Lymphocytes Relative: 25 % (ref 12–46)
MCH: 19.6 pg — ABNORMAL LOW (ref 26.0–34.0)
MCHC: 29.3 g/dL — ABNORMAL LOW (ref 30.0–36.0)
MCV: 66.9 fL — ABNORMAL LOW (ref 78.0–100.0)
MONOS PCT: 7 % (ref 3–12)
Monocytes Absolute: 0.6 10*3/uL (ref 0.1–1.0)
Neutro Abs: 6.1 10*3/uL (ref 1.7–7.7)
Neutrophils Relative %: 67 % (ref 43–77)
PLATELETS: 371 10*3/uL (ref 150–400)
RBC: 5.05 MIL/uL (ref 4.22–5.81)
RDW: 23.1 % — ABNORMAL HIGH (ref 11.5–15.5)
WBC: 9.1 10*3/uL (ref 4.0–10.5)

## 2014-01-09 LAB — COMPREHENSIVE METABOLIC PANEL
ALT: 12 U/L (ref 0–53)
AST: 35 U/L (ref 0–37)
Albumin: 2 g/dL — ABNORMAL LOW (ref 3.5–5.2)
Alkaline Phosphatase: 80 U/L (ref 39–117)
BILIRUBIN TOTAL: 1.1 mg/dL (ref 0.3–1.2)
BUN: 14 mg/dL (ref 6–23)
CHLORIDE: 100 meq/L (ref 96–112)
CO2: 29 meq/L (ref 19–32)
CREATININE: 1.14 mg/dL (ref 0.50–1.35)
Calcium: 8.3 mg/dL — ABNORMAL LOW (ref 8.4–10.5)
GFR, EST AFRICAN AMERICAN: 77 mL/min — AB (ref >90)
GFR, EST NON AFRICAN AMERICAN: 67 mL/min — AB (ref >90)
GLUCOSE: 82 mg/dL (ref 70–99)
Potassium: 3.9 mEq/L (ref 3.7–5.3)
Sodium: 143 mEq/L (ref 137–147)
Total Protein: 7.3 g/dL (ref 6.0–8.3)

## 2014-01-09 LAB — RAPID URINE DRUG SCREEN, HOSP PERFORMED
Amphetamines: NOT DETECTED
BARBITURATES: NOT DETECTED
Benzodiazepines: NOT DETECTED
Cocaine: NOT DETECTED
Opiates: NOT DETECTED
Tetrahydrocannabinol: NOT DETECTED

## 2014-01-09 LAB — ETHANOL

## 2014-01-09 NOTE — ED Provider Notes (Signed)
Patient is a 63 year old male with history of dementia and lung cancer. He is currently on home hospice. He was brought here for evaluation of combativeness at home. The family has apparently been having difficulty handling him. Medical workup was essentially unremarkable. He was evaluated by social services and case management. They have discussed the case with the patient's daughter. He is in line for inpatient hospice care. He is hopefully going to have a better early this week, possibly as early as Monday. He will be discharged until these arrangements can be made.  Veryl Speak, MD 01/09/14 1005

## 2014-01-09 NOTE — Progress Notes (Signed)
CSW spoke with [facility] on-call social worker [place]. CSW updated social worker on patient situation, Education officer, museum states that she will contact family to explain to them that patient needs to return home to await [facility] placement. Brayton Layman states that she will work with family to get patient placed as soon as possible. Monica reports that [facility] will not be able to increase home hospice services.   CSW contacted daughter Baxter Flattery to provide update. Baxter Flattery confirmed that she had spoken to [place] and understands that her father will need to return home to await placement. CSW provided daughter with information on private duty home health aides to possibly supplement assistance at home until residential hospice placement is obtained. Daughter thanked CSW for assistance and requested that patient return home via ambulance.   CSW updated MD and RN. CSW signing off, please re-consult if further social work needs arise.  Tilden Fossa, MSW, [place] Clinical Social Worker [facility] Emergency Dept. [phone]

## 2014-01-09 NOTE — ED Notes (Signed)
[facility] called to pick pt up and take home per daughter's request.

## 2014-01-09 NOTE — ED Notes (Signed)
Pt tries to get out of bed; Pt placed back in bed and given call light; Pt able to follow simple commands.

## 2014-01-09 NOTE — ED Notes (Signed)
MD at bedside. 

## 2014-01-09 NOTE — Progress Notes (Signed)
CSW received referral to see patient due to patient being difficult to manage at home. CSW reviewed patient chart, notes indicate that he is not A&O x4 at this time. No family in room when CSW attempted to assess, patient asleep. CSW contacted daughter Cathlyn Parsons [phone] via telephone and spoke with both daughter and her husband. Patient currently receives home hospice services through [facility].  Husband stated that patient could not return home with them, as he has become unmanageable at home. Family reports that patient is pulling out catheter, frequently calling on daughter/son-in-law, not eating, trying to get out of bed, getting wrapped up in his oxygen cord, and being physically combative. Family report that they signed paperwork for [facility] residential hospice facility approximately 2 days ago and are awaiting a bed. They do not feel that they are able to care for him at home in the meantime.   CSW offered support and educated family on residential hospice placement process (go through home hospice RN and Education officer, museum). CSW informed family that if bed is not available over the weekend, increasing home hospice services might be a possibility until residential hospice bed becomes available. CSW will contact [facility] to inquire about availability at residential facility. CSW will continue to follow to assist with increasing care for patient at discharge.  Tilden Fossa, MSW, [place] Clinical Social Worker [facility] Emergency Dept. [phone]

## 2014-01-09 NOTE — Progress Notes (Signed)
Currently no bed at [facility] is on wait list.Social worker liaised with patients family .Family willing for patient to return home with services which are in place. No new CM needs at this time.MD and patients primary nurse updated regarding discharge plan.

## 2014-01-09 NOTE — ED Notes (Signed)
Pt at edge of bed when walked into room. Pt repositioned in semi fowlers and covered with blanket. No complaints at this time. Will continue to monitor pt.

## 2014-01-09 NOTE — Progress Notes (Signed)
CSW informed by [place] that [facility] has no availability at this time. Liaison [place] suggested that weekend staff at [facility] come to evaluate patient. CSW contacted [facility] after hours telephone number [phone] and left message for on-call staff member. Advised that CSW should receive call back within 15 minutes.  Tilden Fossa, MSW, [place] Clinical Social Worker [facility] Emergency Dept. [phone]

## 2014-01-09 NOTE — Progress Notes (Signed)
Spoke with ED Education officer, museum.Patient is in ED as family are unable to cope with patient at home,patient currently has In home hospice care with hospice [place] care services of  [place].Patients family have completed the paperwork to secure patient a bed at [facility] ( residential hospice) Social worker will liaise with hospice re patients plan. CM  Will provide assistance as needed.No current CM needs identified after EPIC chart review.

## 2014-01-09 NOTE — Progress Notes (Signed)
Case manager and ED Social worker attempted to meet patient/ Family.Patient sleeping and no family available at this time in ED.Will discuss plan with Editor, commissioning.

## 2014-01-18 ENCOUNTER — Ambulatory Visit: Payer: Medicaid Other | Admitting: Internal Medicine

## 2014-01-27 DEATH — deceased

## 2014-03-09 ENCOUNTER — Ambulatory Visit: Payer: Medicaid Other | Admitting: Internal Medicine

## 2014-04-21 ENCOUNTER — Encounter: Payer: Self-pay | Admitting: Internal Medicine

## 2014-10-07 ENCOUNTER — Encounter (HOSPITAL_COMMUNITY): Payer: Self-pay | Admitting: Internal Medicine

## 2015-04-15 IMAGING — CR DG CHEST 2V
2 series · 2 of 2 positions shown · non-contrast
Comparison: DG CHEST 2 VIEW dated 04/01/2013;

CLINICAL DATA: Shortness of breath. Congestion for 2 days.
Ex-smoker. Hypertension and diabetes.

EXAM:
CHEST  2 VIEW

[w chest pa]
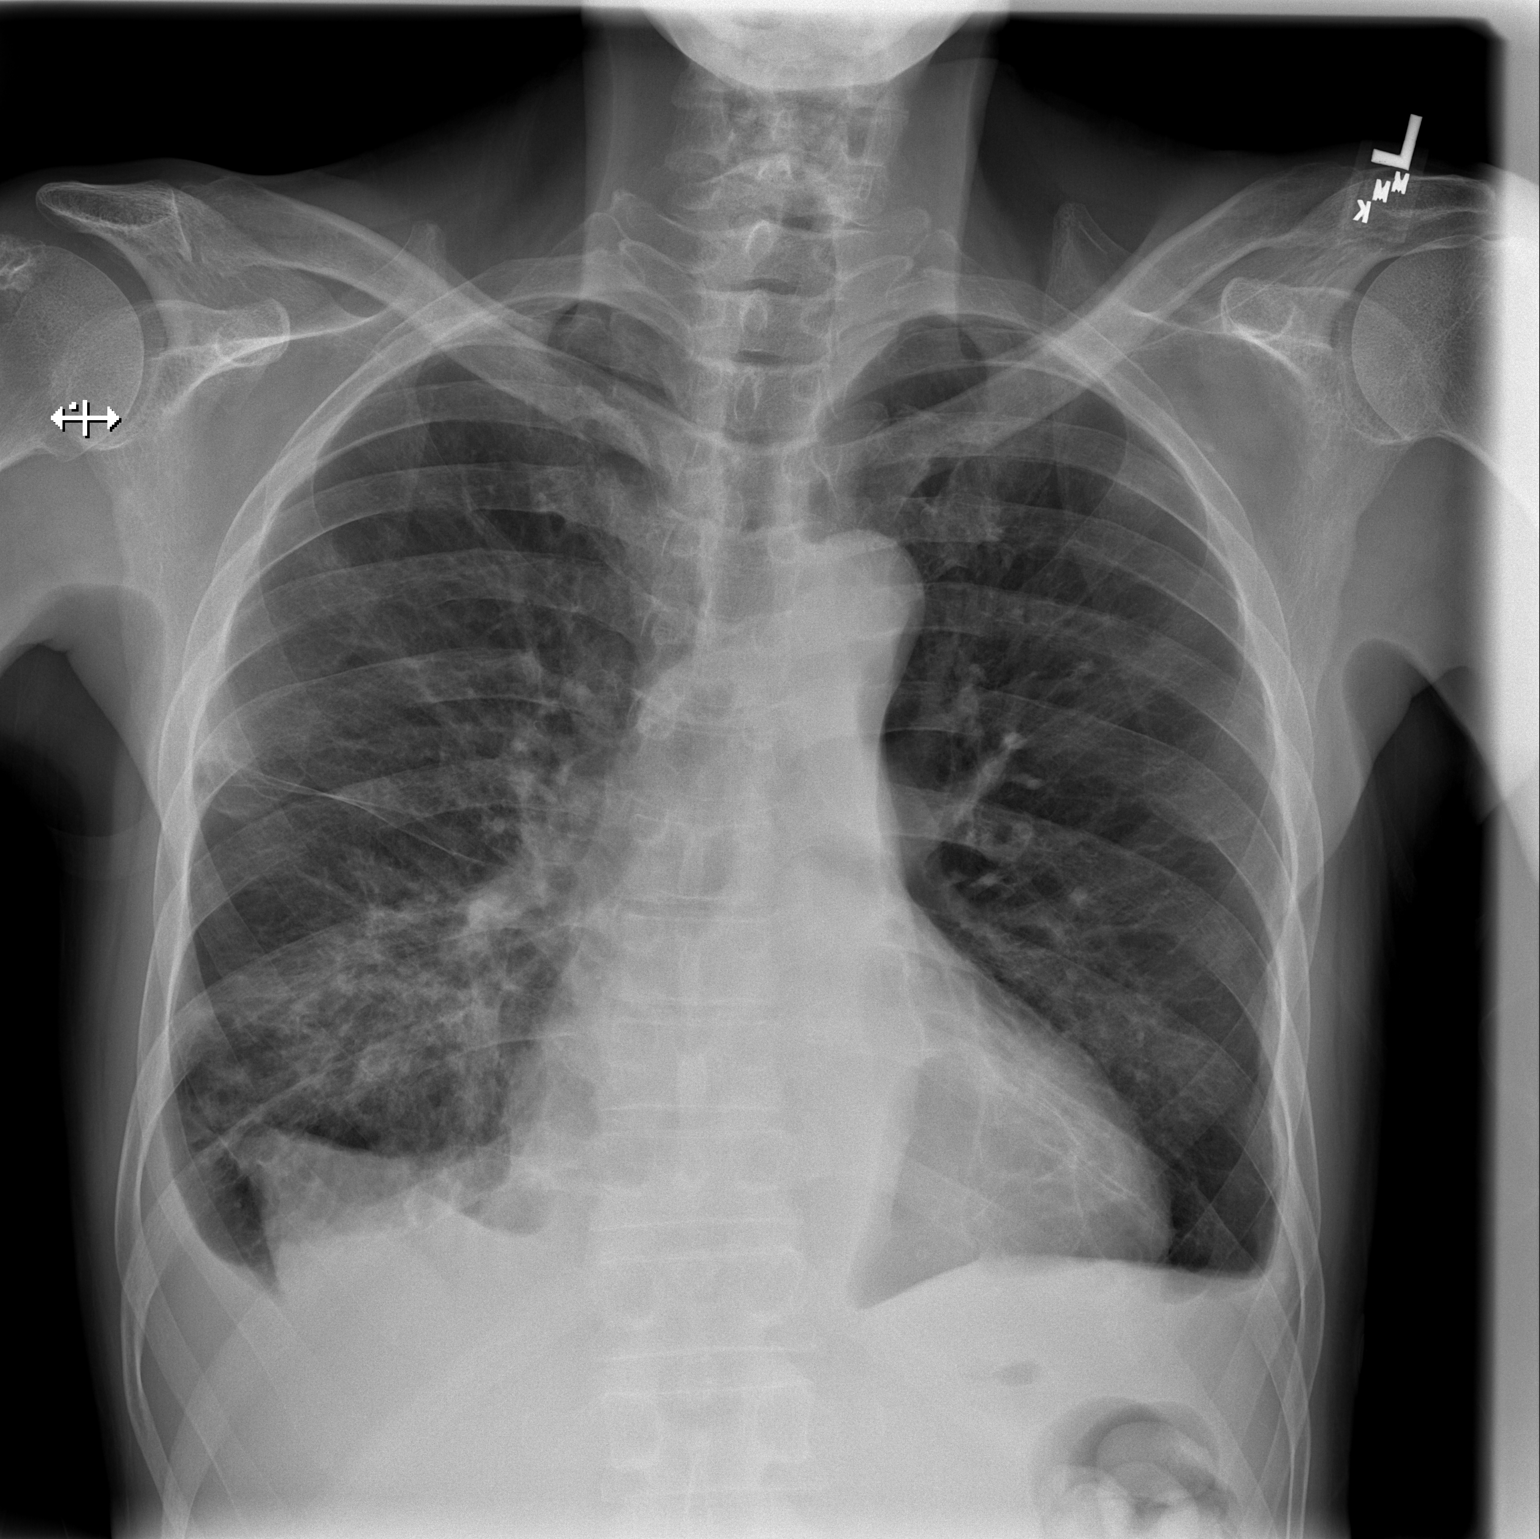

[w chest lat]
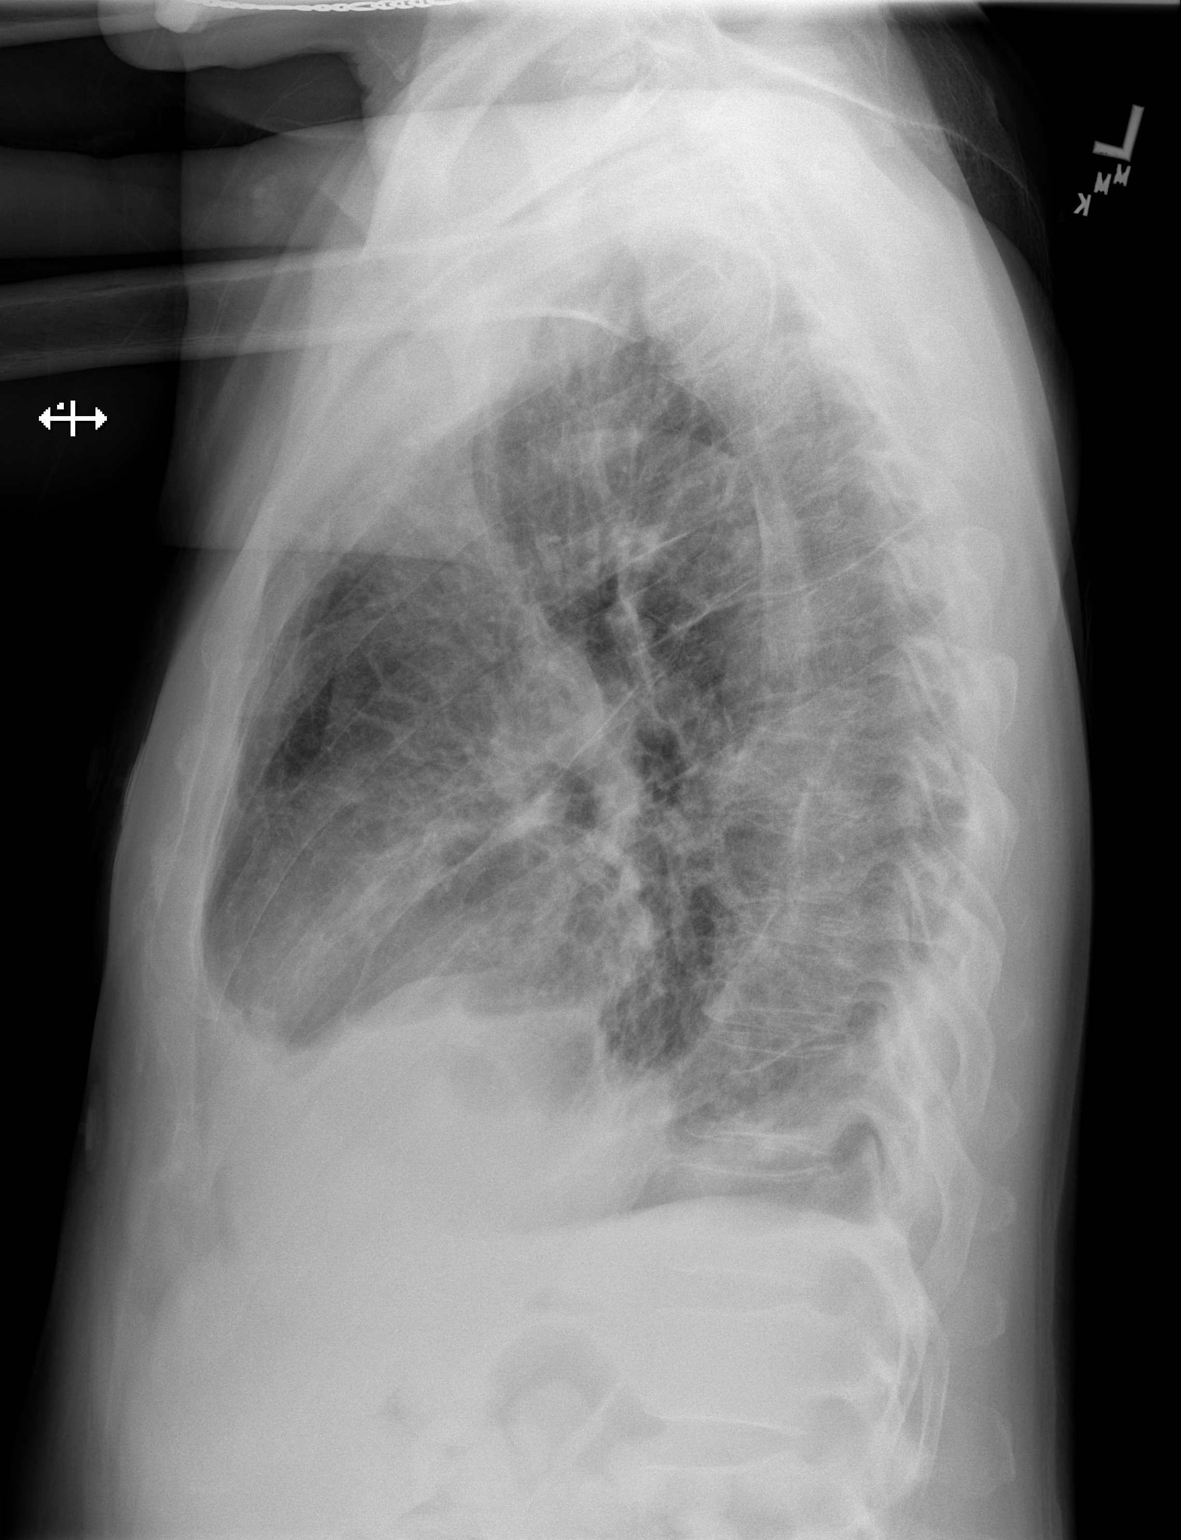

[2 of 2 positions shown; findings below may reference images not displayed]

CT CHEST W/CM dated
02/26/2013; NM PET IMAGE INITIAL (PI) SKULL BASE TO THIGH dated
03/17/2013
FINDINGS: Hyperinflation. Midline trachea. Mild cardiomegaly. Can't exclude
right hilar soft tissue fullness. Improved left-sided aeration.
Persistent interstitial and airspace opacities on the right. Most
confluent in the right lower lobe. Small bilateral pleural
effusions. No pneumothorax.
IMPRESSION: Patchy right-sided interstitial and airspace disease, suspicious for
infection. Recommend radiographic follow-up until clearing.

Cardiomegaly with small bilateral pleural effusions.

Cannot exclude right hilar soft tissue fullness. Recommend attention
on follow-up.

## 2015-04-16 IMAGING — CT CT HEAD W/O CM
1 series · 15 of 30 positions shown, 19 images · non-contrast
Comparison: Images from the PET/CT performed 03/17/2013, and MRI of
the brain performed 03/02/2013

CLINICAL DATA: Shortness of breath.

EXAM:
CT HEAD WITHOUT CONTRAST
TECHNIQUE: Contiguous axial images were obtained from the base of the skull
through the vertex without intravenous contrast.

[Series 2: head 5.0 h30s · axial · 0.43mm/px · z∈[+1534,+1669]mm · 15 of 30 slices shown, 19 images]
[im 2/30  brain]
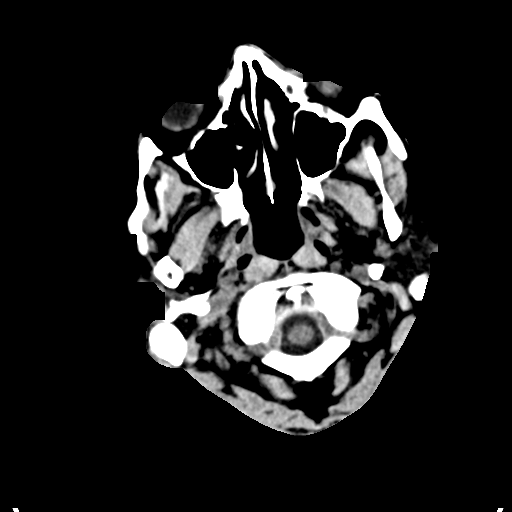
[im 2/30  bone]
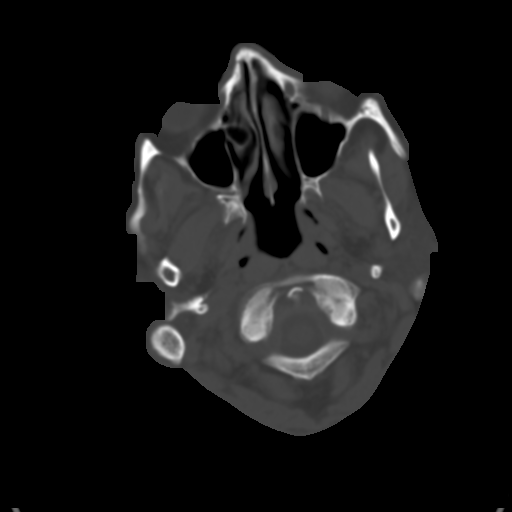
[im 4/30  brain]
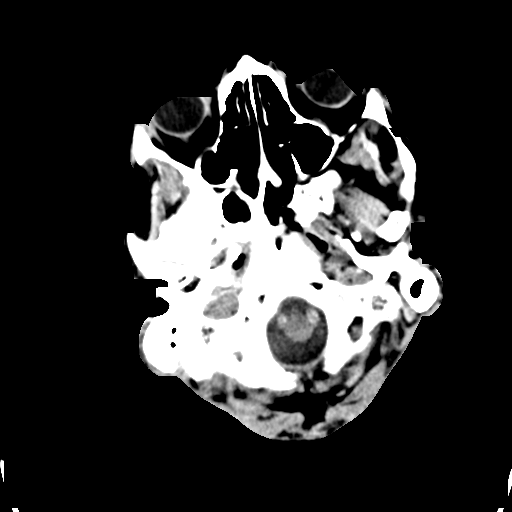
[im 6/30  brain]
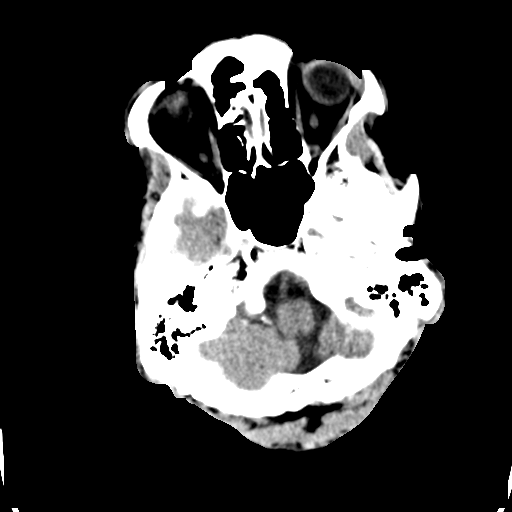
[im 8/30  brain]
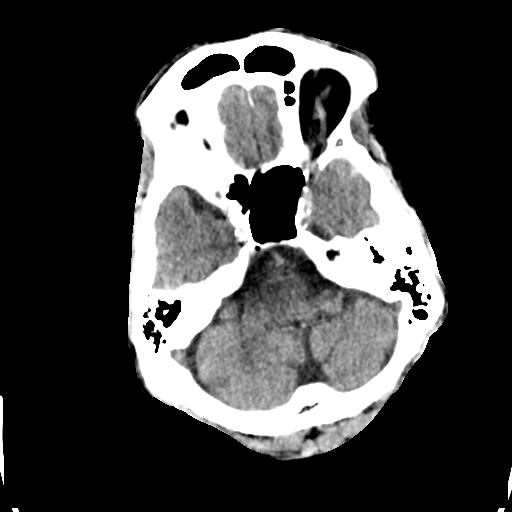
[im 10/30  brain]
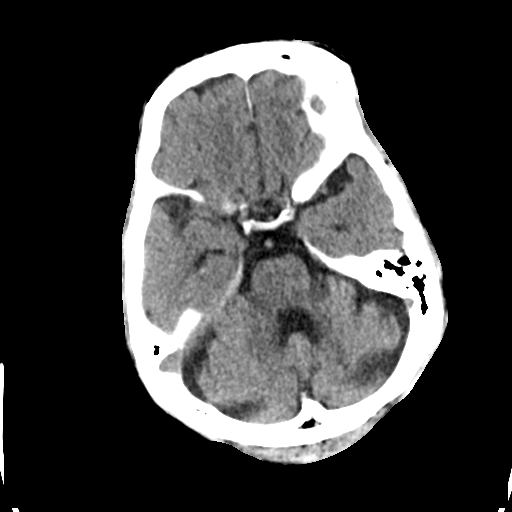
[im 10/30  bone]
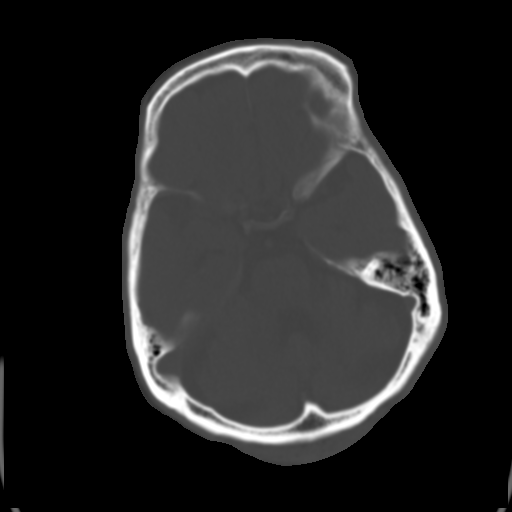
[im 12/30  brain]
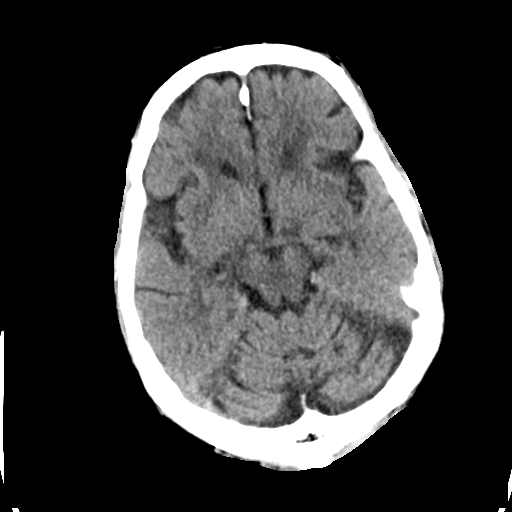
[im 14/30  brain]
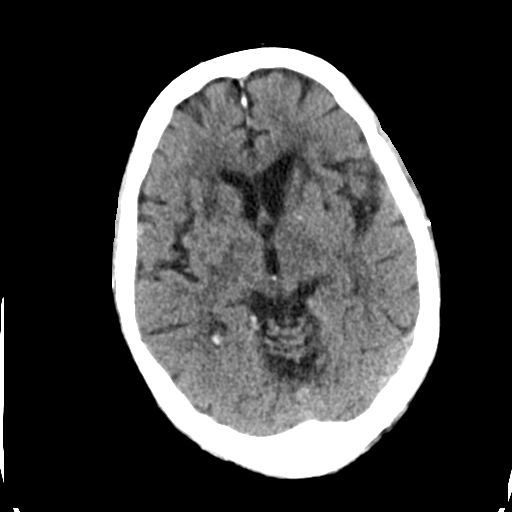
[im 16/30  brain]
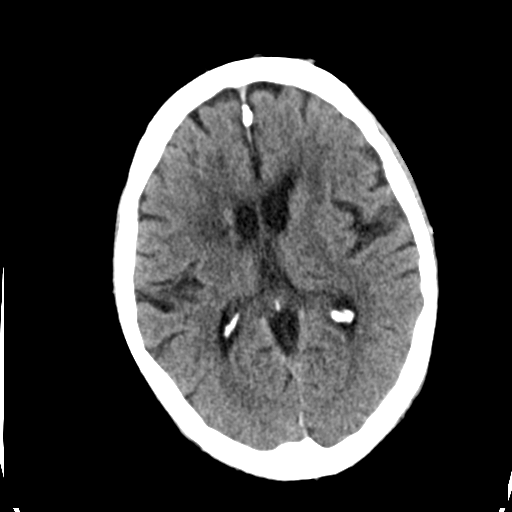
[im 17/30  brain]
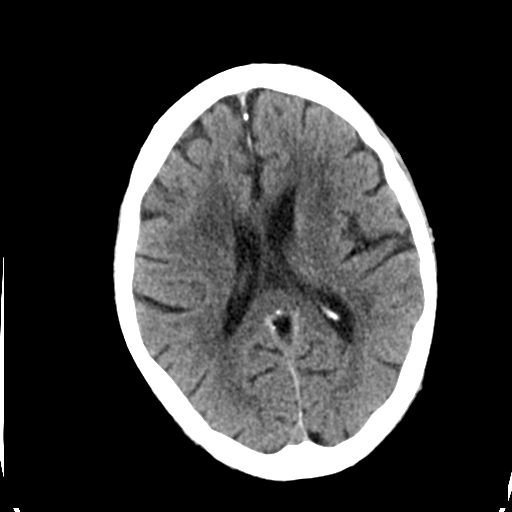
[im 17/30  bone]
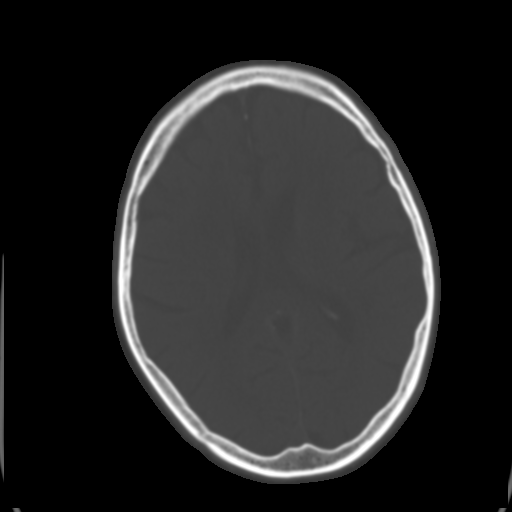
[im 19/30  brain]
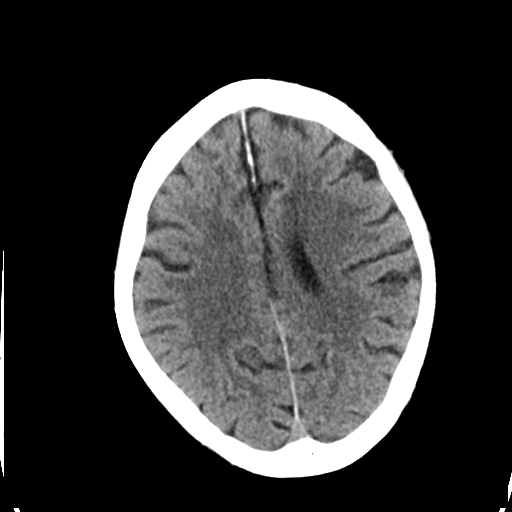
[im 21/30  brain]
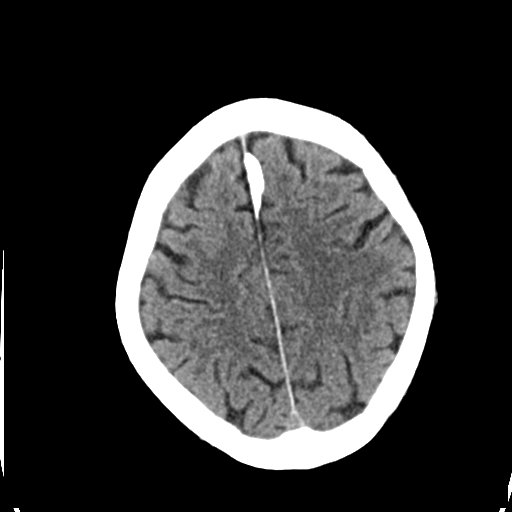
[im 23/30  brain]
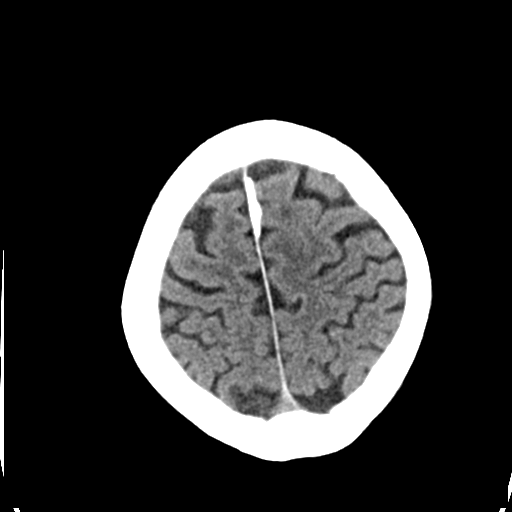
[im 25/30  brain]
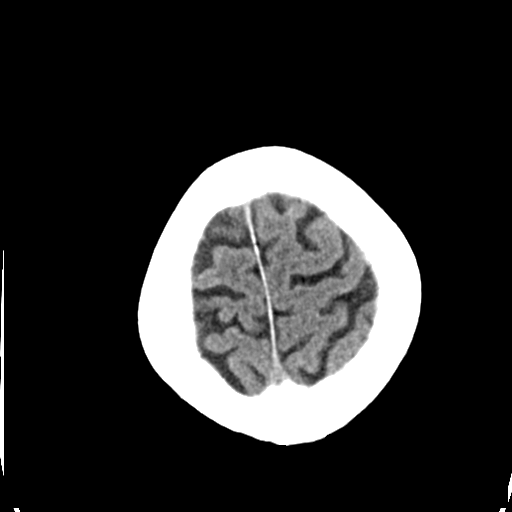
[im 25/30  bone]
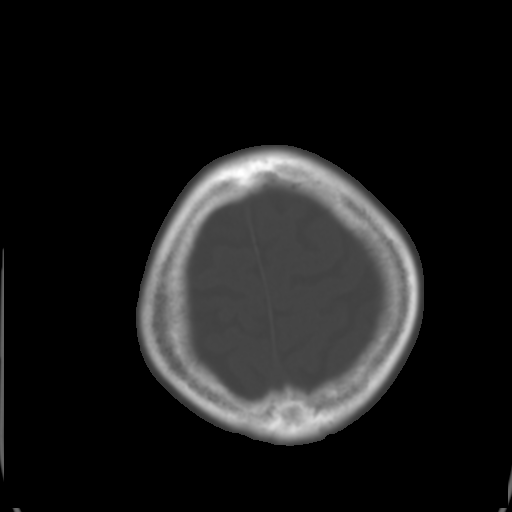
[im 27/30  brain]
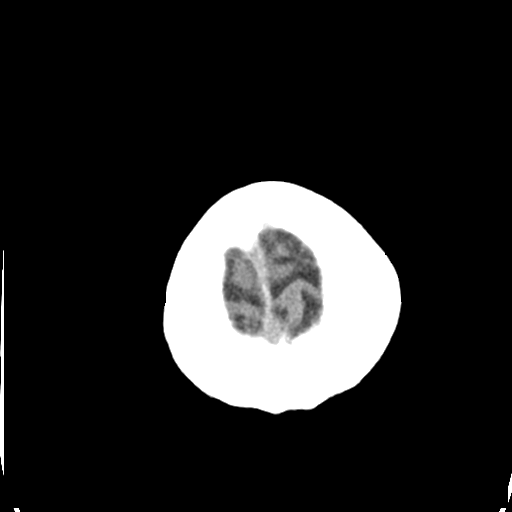
[im 29/30  brain]
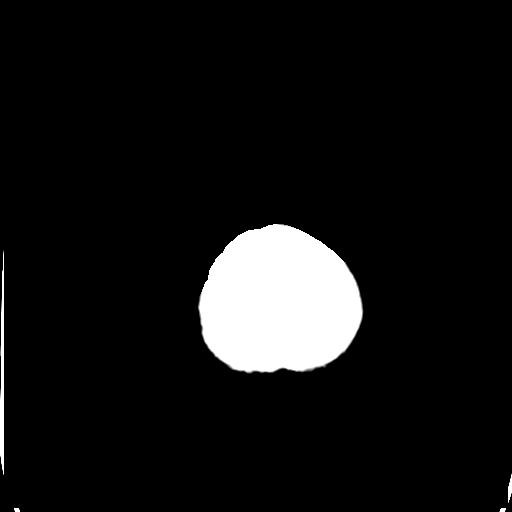

[15 of 30 positions shown; findings below may reference images not displayed]

FINDINGS: There is no evidence of acute infarction, mass lesion, or intra- or
extra-axial hemorrhage on CT.

Cerebellar atrophy is noted. Scattered periventricular and
subcortical white matter change likely reflects small vessel
ischemic microangiopathy. Scattered chronic lacunar infarcts are
seen within the basal ganglia.

The brainstem and fourth ventricle are within normal limits. The
third and lateral ventricles are unremarkable in appearance. The
cerebral hemispheres are symmetric in appearance, with normal
gray-white differentiation. No mass effect or midline shift is seen.

There is no evidence of fracture; visualized osseous structures are
unremarkable in appearance. The visualized portions of the orbits
are within normal limits. The paranasal sinuses and mastoid air
cells are well-aerated. No significant soft tissue abnormalities are
seen.
IMPRESSION: 1. No acute intracranial pathology seen on CT.
2. Cerebellar atrophy noted.
3. Scattered small vessel ischemic microangiopathy; scattered
chronic lacunar infarcts within the basal ganglia.

## 2015-05-09 IMAGING — CR DG CHEST 2V
2 series · 2 of 2 positions shown · non-contrast
Comparison: November 23, 2013.

CLINICAL DATA: Fatigue.

EXAM:
CHEST  2 VIEW

[w chest lat]
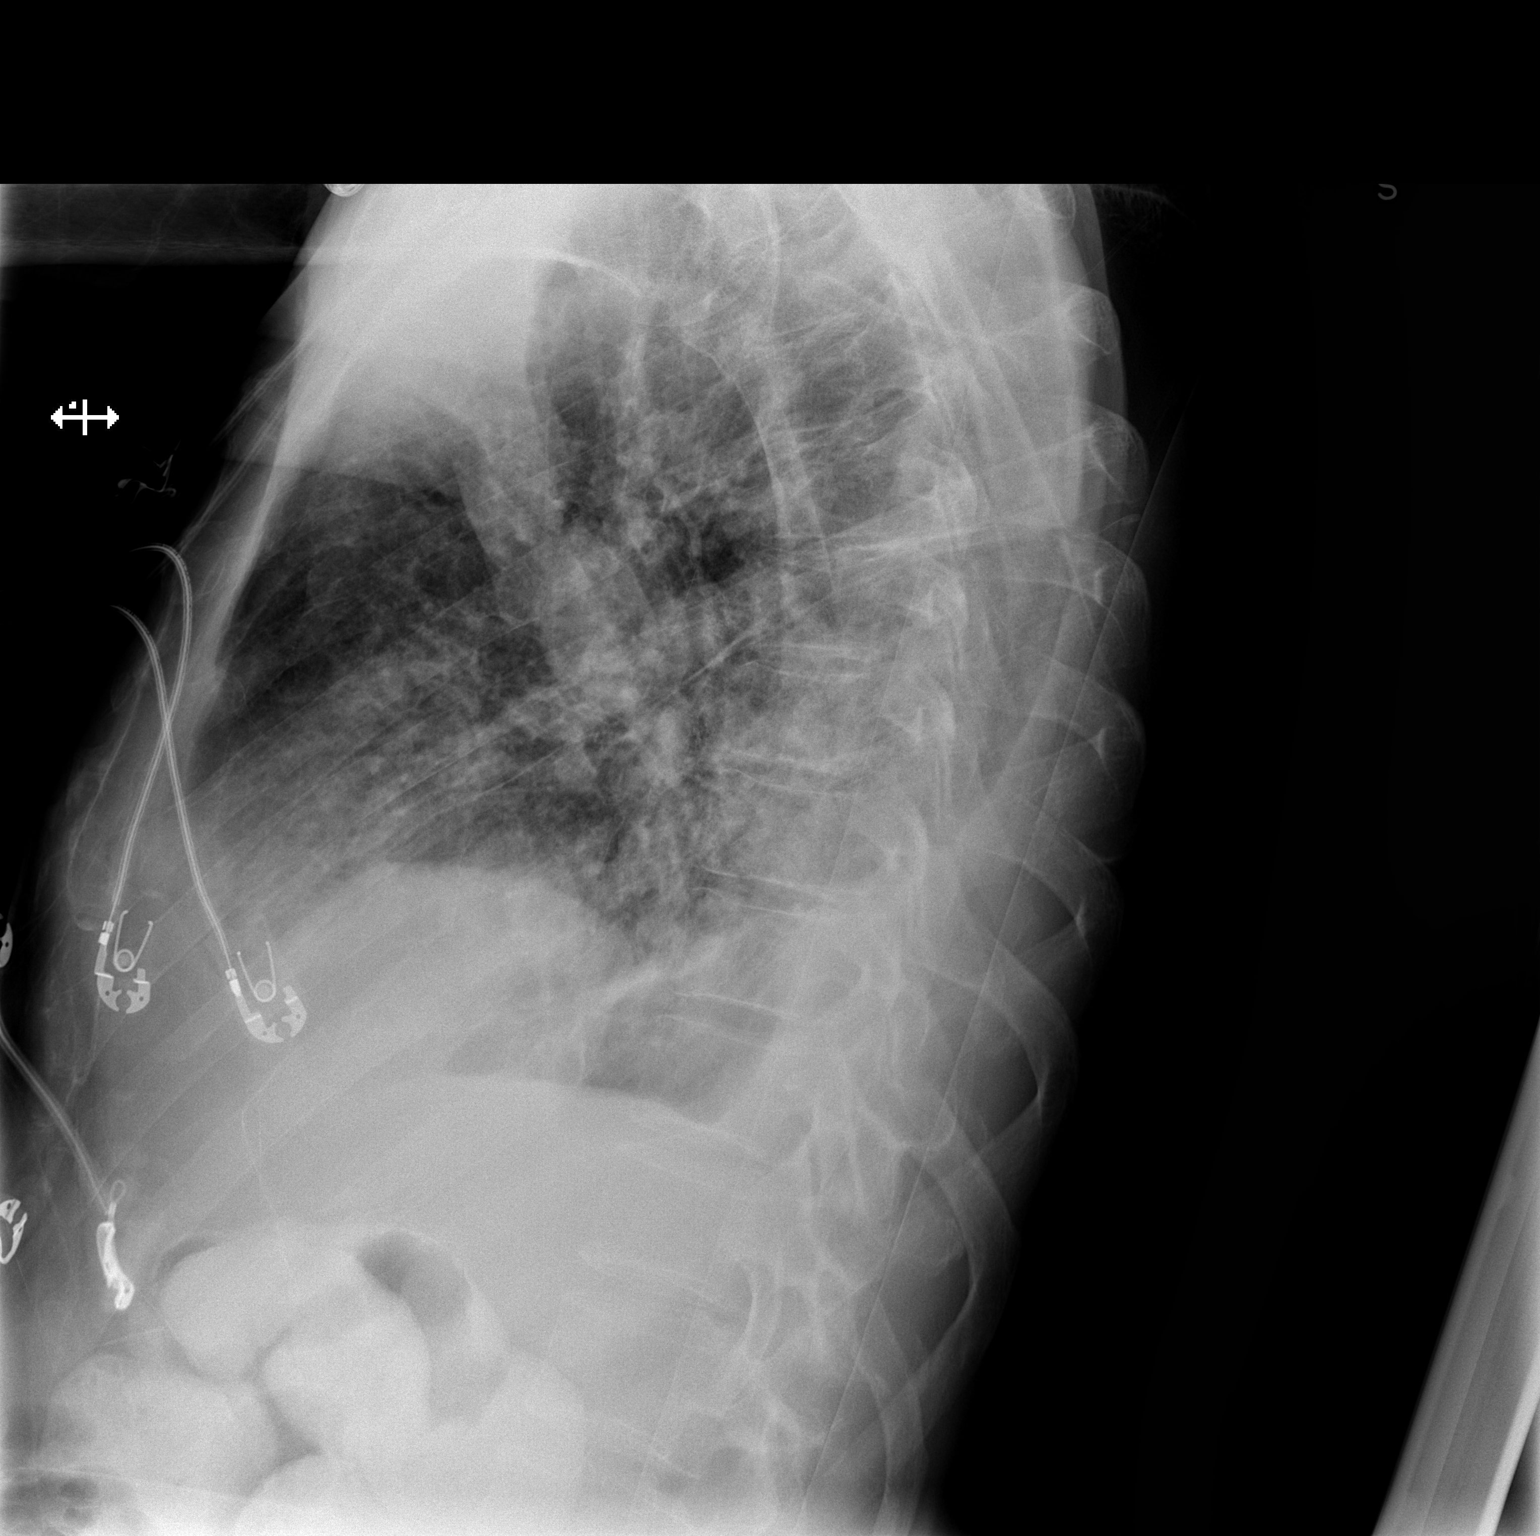

[x chest ap]
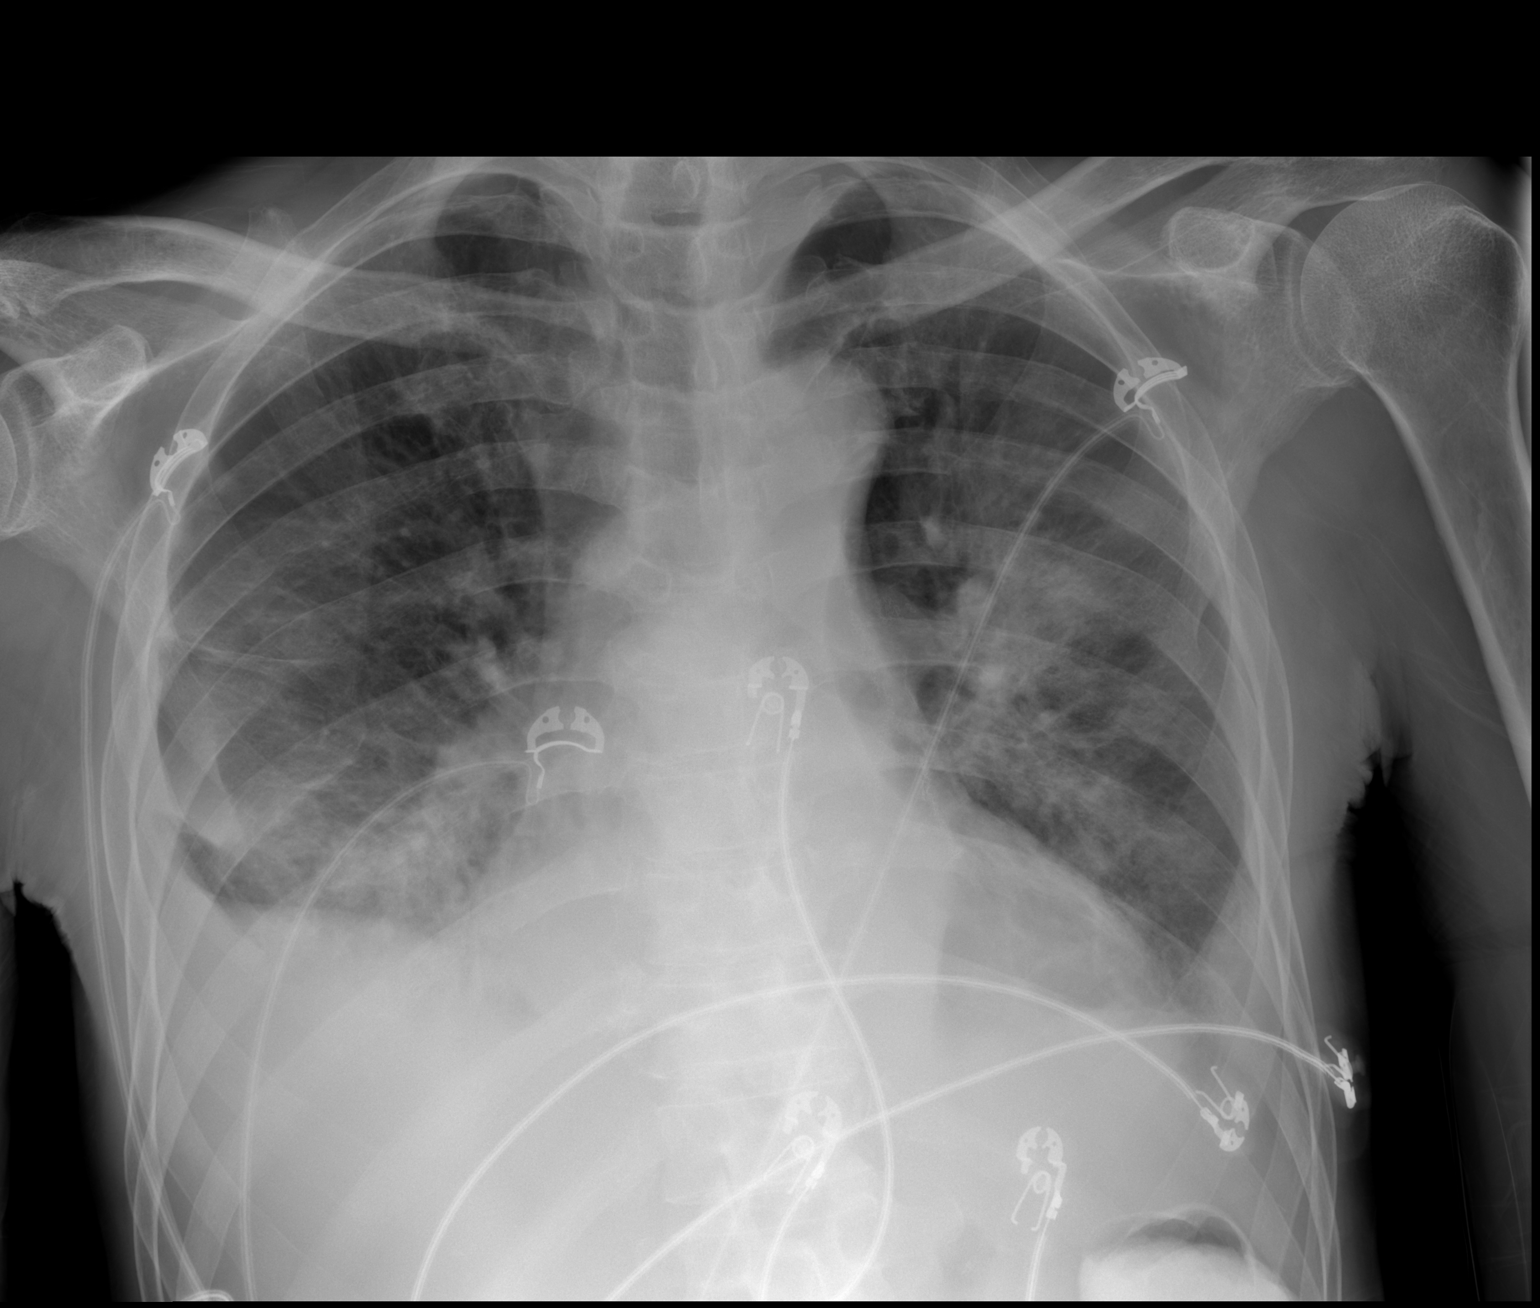

[2 of 2 positions shown; findings below may reference images not displayed]

FINDINGS: Mild cardiomegaly is noted. Left pleural effusion is noted which is
improved compared to prior exam. However, mild right pleural
effusion is noted which is significantly increased compared to prior
exam. Left perihilar and right infrahilar opacities are noted
concerning for edema or possibly pneumonia. No pneumothorax is
noted. Bony thorax is intact.
IMPRESSION: Left perihilar and right infrahilar opacities are noted concerning
for edema or possibly pneumonia. Increased right pleural effusion is
noted, with improved left pleural effusion.
